# Patient Record
Sex: Female | Born: 1950 | Race: Black or African American | Hispanic: No | Smoking: Never smoker
Health system: Southern US, Community
[De-identification: ages and names within clinical notes are randomized; demographics above are authoritative.]

## PROBLEM LIST (undated history)

## (undated) DIAGNOSIS — K219 Gastro-esophageal reflux disease without esophagitis: Secondary | ICD-10-CM

## (undated) DIAGNOSIS — Z9889 Other specified postprocedural states: Secondary | ICD-10-CM

## (undated) DIAGNOSIS — M1990 Unspecified osteoarthritis, unspecified site: Secondary | ICD-10-CM

## (undated) DIAGNOSIS — I10 Essential (primary) hypertension: Secondary | ICD-10-CM

## (undated) DIAGNOSIS — L509 Urticaria, unspecified: Secondary | ICD-10-CM

## (undated) HISTORY — DX: Other specified postprocedural states: Z98.890

## (undated) HISTORY — DX: Urticaria, unspecified: L50.9

## (undated) HISTORY — PX: BREAST SURGERY: SHX581

## (undated) HISTORY — PX: ABDOMINAL HYSTERECTOMY: SHX81

---

## 1998-06-23 ENCOUNTER — Ambulatory Visit (HOSPITAL_COMMUNITY): Admission: RE | Admit: 1998-06-23 | Discharge: 1998-06-23 | Payer: Self-pay | Admitting: *Deleted

## 1998-07-08 ENCOUNTER — Ambulatory Visit (HOSPITAL_COMMUNITY): Admission: RE | Admit: 1998-07-08 | Discharge: 1998-07-08 | Payer: Self-pay | Admitting: *Deleted

## 1998-07-21 ENCOUNTER — Ambulatory Visit (HOSPITAL_COMMUNITY): Admission: RE | Admit: 1998-07-21 | Discharge: 1998-07-21 | Payer: Self-pay | Admitting: Gastroenterology

## 2000-02-09 ENCOUNTER — Emergency Department (HOSPITAL_COMMUNITY): Admission: EM | Admit: 2000-02-09 | Discharge: 2000-02-09 | Payer: Self-pay | Admitting: Emergency Medicine

## 2000-02-09 ENCOUNTER — Encounter: Payer: Self-pay | Admitting: Emergency Medicine

## 2000-04-05 ENCOUNTER — Encounter: Payer: Self-pay | Admitting: Obstetrics and Gynecology

## 2000-04-05 ENCOUNTER — Encounter: Admission: RE | Admit: 2000-04-05 | Discharge: 2000-04-05 | Payer: Self-pay | Admitting: Obstetrics and Gynecology

## 2000-04-11 ENCOUNTER — Encounter: Payer: Self-pay | Admitting: Obstetrics and Gynecology

## 2000-04-11 ENCOUNTER — Encounter: Admission: RE | Admit: 2000-04-11 | Discharge: 2000-04-11 | Payer: Self-pay | Admitting: Obstetrics and Gynecology

## 2001-04-12 ENCOUNTER — Encounter: Payer: Self-pay | Admitting: Obstetrics and Gynecology

## 2001-04-12 ENCOUNTER — Encounter: Admission: RE | Admit: 2001-04-12 | Discharge: 2001-04-12 | Payer: Self-pay | Admitting: Obstetrics and Gynecology

## 2002-10-02 ENCOUNTER — Ambulatory Visit (HOSPITAL_COMMUNITY): Admission: RE | Admit: 2002-10-02 | Discharge: 2002-10-02 | Payer: Self-pay | Admitting: Gastroenterology

## 2002-10-02 ENCOUNTER — Encounter: Payer: Self-pay | Admitting: Gastroenterology

## 2003-09-01 ENCOUNTER — Encounter (INDEPENDENT_AMBULATORY_CARE_PROVIDER_SITE_OTHER): Payer: Self-pay | Admitting: *Deleted

## 2003-09-01 ENCOUNTER — Ambulatory Visit (HOSPITAL_COMMUNITY): Admission: RE | Admit: 2003-09-01 | Discharge: 2003-09-01 | Payer: Self-pay | Admitting: Gastroenterology

## 2009-07-11 ENCOUNTER — Emergency Department (HOSPITAL_COMMUNITY): Admission: EM | Admit: 2009-07-11 | Discharge: 2009-07-11 | Payer: Self-pay | Admitting: Family Medicine

## 2011-03-06 LAB — URINALYSIS, ROUTINE W REFLEX MICROSCOPIC
Bilirubin Urine: NEGATIVE
Glucose, UA: NEGATIVE mg/dL
Hgb urine dipstick: NEGATIVE
Ketones, ur: NEGATIVE mg/dL
Nitrite: NEGATIVE
Protein, ur: NEGATIVE mg/dL
Specific Gravity, Urine: 1.019 (ref 1.005–1.030)
Urobilinogen, UA: 0.2 mg/dL (ref 0.0–1.0)
pH: 7 (ref 5.0–8.0)

## 2011-03-06 LAB — CBC
HCT: 40 % (ref 36.0–46.0)
Hemoglobin: 12.8 g/dL (ref 12.0–15.0)
MCHC: 31.9 g/dL (ref 30.0–36.0)
MCV: 92.4 fL (ref 78.0–100.0)
Platelets: 322 10*3/uL (ref 150–400)
RBC: 4.33 MIL/uL (ref 3.87–5.11)
RDW: 13.9 % (ref 11.5–15.5)
WBC: 7.8 10*3/uL (ref 4.0–10.5)

## 2011-03-06 LAB — POCT CARDIAC MARKERS
CKMB, poc: 1.9 ng/mL (ref 1.0–8.0)
CKMB, poc: 2.5 ng/mL (ref 1.0–8.0)
Myoglobin, poc: 441 ng/mL (ref 12–200)
Myoglobin, poc: >500 ng/mL (ref 12–200)
Troponin i, poc: <0.05 ng/mL (ref 0.00–0.09)
Troponin i, poc: <0.05 ng/mL (ref 0.00–0.09)

## 2011-03-06 LAB — CK: Total CK: 232 U/L — ABNORMAL HIGH (ref 7–177)

## 2011-03-06 LAB — GLUCOSE, CAPILLARY: Glucose-Capillary: 100 mg/dL — ABNORMAL HIGH (ref 70–99)

## 2011-03-06 LAB — DIFFERENTIAL
Basophils Absolute: 0 10*3/uL (ref 0.0–0.1)
Basophils Relative: 0 % (ref 0–1)
Eosinophils Absolute: 0 10*3/uL (ref 0.0–0.7)
Eosinophils Relative: 0 % (ref 0–5)
Lymphocytes Relative: 14 % (ref 12–46)
Lymphs Abs: 1.1 10*3/uL (ref 0.7–4.0)
Monocytes Absolute: 0.6 10*3/uL (ref 0.1–1.0)
Monocytes Relative: 8 % (ref 3–12)
Neutro Abs: 6.1 10*3/uL (ref 1.7–7.7)
Neutrophils Relative %: 77 % (ref 43–77)

## 2011-03-06 LAB — COMPREHENSIVE METABOLIC PANEL
ALT: 17 U/L (ref 0–35)
AST: 29 U/L (ref 0–37)
Albumin: 3.9 g/dL (ref 3.5–5.2)
Alkaline Phosphatase: 58 U/L (ref 39–117)
BUN: 13 mg/dL (ref 6–23)
CO2: 30 mEq/L (ref 19–32)
Calcium: 9.1 mg/dL (ref 8.4–10.5)
Chloride: 100 mEq/L (ref 96–112)
Creatinine, Ser: 0.95 mg/dL (ref 0.4–1.2)
GFR calc Af Amer: >60 mL/min (ref >60)
GFR calc non Af Amer: >60 mL/min (ref >60)
Glucose, Bld: 93 mg/dL (ref 70–99)
Potassium: 3.9 mEq/L (ref 3.5–5.1)
Sodium: 137 mEq/L (ref 135–145)
Total Bilirubin: 0.4 mg/dL (ref 0.3–1.2)
Total Protein: 7.3 g/dL (ref 6.0–8.3)

## 2011-03-06 LAB — TROPONIN I: Troponin I: 0.02 ng/mL (ref 0.00–0.06)

## 2011-04-15 NOTE — Op Note (Signed)
NAME:  [name], [name]                           ACCOUNT NO.:  [id number]   MEDICAL RECORD NO.:  [id number]                   PATIENT TYPE:  AMB   LOCATION:  ENDO                                 FACILITY:  [facility]   PHYSICIAN:  Anselmo Rod, M.D.               DATE OF BIRTH:  04-12-1951   DATE OF PROCEDURE:  09/01/2003  DATE OF DISCHARGE:                                 OPERATIVE REPORT   PROCEDURE PERFORMED:  Colonoscopy with snare polypectomy x1.   ENDOSCOPIST:  Anselmo Rod, M.D.   INSTRUMENT USED:  Olympus video colonoscope.   INDICATION FOR PROCEDURE:  Guaiac-positive stools in a 60 year old African-  American female.  Rule out colonic polyps, masses, etc.   PREPROCEDURE PREPARATION:  Informed consent was procured from the patient.  The patient was fasted for eight hours prior to the procedure and prepped  with a bottle of magnesium citrate and a gallon of GoLYTELY the night prior  to the procedure.   PREPROCEDURE PHYSICAL:  VITAL SIGNS:  The patient had stable vital signs.  NECK:  Supple.  CHEST:  Clear to auscultation.  S1, S2 regular.  ABDOMEN:  Soft with normal bowel sounds.   DESCRIPTION OF PROCEDURE:  The patient was placed in the left lateral  decubitus position and sedated with an additional 20 mg of Demerol and 2 mg  of Versed intravenously.  The patient received 50 mg of Demerol and 5 mg of  Versed for the EGD done prior to the colonoscopy.  Once the patient was  adequately sedate and maintained on low-flow oxygen and continuous cardiac  monitoring, the Olympus video colonoscope was advanced from the rectum to  the cecum.  There was some residual stool in the colon.  Multiple washes  were done.  Small internal hemorrhoids were seen on retroflexion.  A polyp  was snared at 35 cm.  The rest of the exam was unrevealing.  The appendiceal  orifice and ileocecal valve were clearly visualized and photographed.  The  terminal ileum appeared normal.   IMPRESSION:  1. Small, nonbleeding internal hemorrhoid.  2. Small sessile polyp snared at 35 cm.  3. Some residual stool in the colon, small lesions could have been missed.  4. Normal-appearing cecum and terminal ileum.   RECOMMENDATIONS:  1. Await pathology results.  2.     Avoid nonsteroidals, including aspirin, for now.  3. Repeat guaiacs on an outpatient basis.  4. Outpatient follow-up in the next two weeks or earlier if need be.                                               Anselmo Rod, M.D.    JNM/MEDQ  D:  09/01/2003  T:  09/02/2003  Job:  [id number]  cc:   Merlene Laughter. Renae Gloss, M.D.  [address]  [address]  [place]  [place] [address]  Fax: [phone]

## 2011-04-15 NOTE — Op Note (Signed)
   NAME:  [name], SCARLETT                           ACCOUNT NO.:  [id number]   MEDICAL RECORD NO.:  [id number]                   PATIENT TYPE:  AMB   LOCATION:  ENDO                                 FACILITY:  [facility]   PHYSICIAN:  Anselmo Rod, M.D.               DATE OF BIRTH:  [birth year]   DATE OF PROCEDURE:  09/01/2003  DATE OF DISCHARGE:                                 OPERATIVE REPORT   PROCEDURE PERFORMED:  Esophagogastroduodenoscopy.   ENDOSCOPIST:  Charna Elizabeth, M.D.   INSTRUMENT USED:  Olympus video panendoscope.   INDICATIONS FOR PROCEDURE:  Guaiac positive stool and epigastric pain in a  61 year old African-American female.  Rule out peptic ulcer disease,  esophagitis, gastritis, etc.   PREPROCEDURE PREPARATION:  Informed consent was procured from the patient.  The patient was fasted for eight hours prior to the procedure.   PREPROCEDURE PHYSICAL:  The patient had stable vital signs.  Neck supple,  chest clear to auscultation.  S1, S2 regular.  Abdomen soft with normal  bowel sounds.   DESCRIPTION OF PROCEDURE:  The patient was placed in the left lateral  decubitus position and sedated with 50 mg of Demerol and 5 mg of Versed  intravenously.  Once the patient was adequately sedated and maintained on  low-flow oxygen and continuous cardiac monitoring, the Olympus video  panendoscope was advanced through the mouth piece over the tongue into the  esophagus under direct vision.  The entire esophagus appeared normal with no  evidence of ring, stricture, masses, esophagitis or Barrett's mucosa.  The  scope was then advanced to the stomach.  The entire gastric mucosa and  proximal small bowel appeared normal.   IMPRESSION:  Normal esophagogastroduodenoscopy.   RECOMMENDATIONS:  Proceed with a colonoscopy at this time.  Further  recommendations will be made thereafter.                                                Anselmo Rod, M.D.    JNM/MEDQ  D:  09/01/2003  T:   09/02/2003  Job:  [id number]   cc:   Merlene Laughter. Renae Gloss, M.D.  [address]  [address]  [place]  [place] [address]  Fax: [phone]

## 2011-05-16 ENCOUNTER — Other Ambulatory Visit: Payer: Self-pay | Admitting: Orthopaedic Surgery

## 2011-05-16 DIAGNOSIS — M79606 Pain in leg, unspecified: Secondary | ICD-10-CM

## 2011-05-16 DIAGNOSIS — M545 Low back pain, unspecified: Secondary | ICD-10-CM

## 2011-05-16 DIAGNOSIS — R531 Weakness: Secondary | ICD-10-CM

## 2011-05-24 ENCOUNTER — Ambulatory Visit
Admission: RE | Admit: 2011-05-24 | Discharge: 2011-05-24 | Disposition: A | Discharge status: Home / Self Care | Payer: BC Managed Care – PPO | Source: Ambulatory Visit | Attending: Orthopaedic Surgery | Admitting: Orthopaedic Surgery

## 2011-05-24 DIAGNOSIS — M545 Low back pain, unspecified: Secondary | ICD-10-CM

## 2011-05-24 DIAGNOSIS — R531 Weakness: Secondary | ICD-10-CM

## 2011-05-24 DIAGNOSIS — M79606 Pain in leg, unspecified: Secondary | ICD-10-CM

## 2012-01-25 ENCOUNTER — Other Ambulatory Visit: Payer: Self-pay | Admitting: Orthopedic Surgery

## 2012-01-27 ENCOUNTER — Encounter (HOSPITAL_COMMUNITY): Payer: Self-pay | Admitting: Pharmacy Technician

## 2012-01-31 ENCOUNTER — Encounter (HOSPITAL_COMMUNITY)
Admission: RE | Admit: 2012-01-31 | Discharge: 2012-01-31 | Disposition: A | Discharge status: Home / Self Care | Payer: BC Managed Care – PPO | Source: Ambulatory Visit | Attending: Orthopedic Surgery | Admitting: Orthopedic Surgery

## 2012-01-31 ENCOUNTER — Encounter (HOSPITAL_COMMUNITY): Payer: Self-pay

## 2012-01-31 ENCOUNTER — Other Ambulatory Visit: Payer: Self-pay

## 2012-01-31 HISTORY — DX: Essential (primary) hypertension: I10

## 2012-01-31 HISTORY — DX: Gastro-esophageal reflux disease without esophagitis: K21.9

## 2012-01-31 HISTORY — DX: Unspecified osteoarthritis, unspecified site: M19.90

## 2012-01-31 LAB — SURGICAL PCR SCREEN
MRSA, PCR: NEGATIVE
Staphylococcus aureus: NEGATIVE

## 2012-01-31 LAB — URINALYSIS, ROUTINE W REFLEX MICROSCOPIC
Bilirubin Urine: NEGATIVE
Glucose, UA: NEGATIVE mg/dL
Hgb urine dipstick: NEGATIVE
Ketones, ur: NEGATIVE mg/dL
Leukocytes, UA: NEGATIVE
Nitrite: NEGATIVE
Protein, ur: NEGATIVE mg/dL
Specific Gravity, Urine: 1.008 (ref 1.005–1.030)
Urobilinogen, UA: 0.2 mg/dL (ref 0.0–1.0)
pH: 6.5 (ref 5.0–8.0)

## 2012-01-31 LAB — COMPREHENSIVE METABOLIC PANEL
ALT: 16 U/L (ref 0–35)
AST: 26 U/L (ref 0–37)
Albumin: 4 g/dL (ref 3.5–5.2)
Alkaline Phosphatase: 66 U/L (ref 39–117)
BUN: 22 mg/dL (ref 6–23)
CO2: 29 mEq/L (ref 19–32)
Calcium: 10 mg/dL (ref 8.4–10.5)
Chloride: 100 mEq/L (ref 96–112)
Creatinine, Ser: 1.03 mg/dL (ref 0.50–1.10)
GFR calc Af Amer: 67 mL/min — ABNORMAL LOW (ref >90)
GFR calc non Af Amer: 57 mL/min — ABNORMAL LOW (ref >90)
Glucose, Bld: 80 mg/dL (ref 70–99)
Potassium: 3.6 mEq/L (ref 3.5–5.1)
Sodium: 138 mEq/L (ref 135–145)
Total Bilirubin: 0.3 mg/dL (ref 0.3–1.2)
Total Protein: 7.6 g/dL (ref 6.0–8.3)

## 2012-01-31 LAB — APTT: aPTT: 29 seconds (ref 24–37)

## 2012-01-31 LAB — DIFFERENTIAL
Basophils Absolute: 0 10*3/uL (ref 0.0–0.1)
Basophils Relative: 0 % (ref 0–1)
Eosinophils Absolute: 0.2 10*3/uL (ref 0.0–0.7)
Eosinophils Relative: 3 % (ref 0–5)
Lymphocytes Relative: 46 % (ref 12–46)
Lymphs Abs: 2.8 10*3/uL (ref 0.7–4.0)
Monocytes Absolute: 0.4 10*3/uL (ref 0.1–1.0)
Monocytes Relative: 7 % (ref 3–12)
Neutro Abs: 2.7 10*3/uL (ref 1.7–7.7)
Neutrophils Relative %: 44 % (ref 43–77)

## 2012-01-31 LAB — CBC
HCT: 39.6 % (ref 36.0–46.0)
Hemoglobin: 12.9 g/dL (ref 12.0–15.0)
MCH: 29.5 pg (ref 26.0–34.0)
MCHC: 32.6 g/dL (ref 30.0–36.0)
MCV: 90.4 fL (ref 78.0–100.0)
Platelets: 368 10*3/uL (ref 150–400)
RBC: 4.38 MIL/uL (ref 3.87–5.11)
RDW: 13.3 % (ref 11.5–15.5)
WBC: 6 10*3/uL (ref 4.0–10.5)

## 2012-01-31 LAB — ABO/RH: ABO/RH(D): O POS

## 2012-01-31 LAB — PROTIME-INR
INR: 0.95 (ref 0.00–1.49)
Prothrombin Time: 12.9 seconds (ref 11.6–15.2)

## 2012-01-31 MED ORDER — POVIDONE-IODINE 7.5 % EX SOLN
Freq: Once | CUTANEOUS | Status: DC
  Filled 2012-01-31: qty 118

## 2012-01-31 NOTE — Pre-Procedure Instructions (Signed)
20 [name]  01/31/2012   Your procedure is scheduled on:  02/08/12  Report to [facility] at 630 AM.  Call this number if you have problems the morning of surgery: [phone]   Remember:   Do not eat food:After Midnight.  May have clear liquids: up to 4 Hours before arrival.  Clear liquids include soda, tea, black coffee, apple or grape juice, broth.  Take these medicines the morning of surgery with A SIP OF WATER: vicodan,estradiol   Do not wear jewelry, make-up or nail polish.  Do not wear lotions, powders, or perfumes. You may wear deodorant.  Do not shave 48 hours prior to surgery.  Do not bring valuables to the hospital.  Contacts, dentures or bridgework may not be worn into surgery.  Leave suitcase in the car. After surgery it may be brought to your room.  For patients admitted to the hospital, checkout time is 11:00 AM the day of discharge.   Patients discharged the day of surgery will not be allowed to drive home.  Name and phone number of your driver: family  Special Instructions: CHG Shower Use Special Wash: 1/2 bottle night before surgery and 1/2 bottle morning of surgery.   Please read over the following fact sheets that you were given: Pain Booklet, Blood Transfusion Information, MRSA Information and Surgical Site Infection Prevention

## 2012-02-07 MED ORDER — VANCOMYCIN HCL IN DEXTROSE 1-5 GM/200ML-% IV SOLN
1000.0000 mg | INTRAVENOUS | Status: AC
  Administered 2012-02-08: 1000 mg via INTRAVENOUS
  Filled 2012-02-07: qty 200

## 2012-02-08 ENCOUNTER — Encounter (HOSPITAL_COMMUNITY): Payer: Self-pay | Admitting: *Deleted

## 2012-02-08 ENCOUNTER — Encounter (HOSPITAL_COMMUNITY): Payer: Self-pay | Admitting: Critical Care Medicine

## 2012-02-08 ENCOUNTER — Inpatient Hospital Stay (HOSPITAL_COMMUNITY)
Admission: RE | Admit: 2012-02-08 | Discharge: 2012-02-11 | DRG: 756 | Disposition: A | Discharge status: Home / Self Care | Payer: BC Managed Care – PPO | Source: Ambulatory Visit | Attending: Orthopedic Surgery | Admitting: Orthopedic Surgery

## 2012-02-08 ENCOUNTER — Encounter (HOSPITAL_COMMUNITY)
Admission: RE | Disposition: A | Discharge status: Home / Self Care | Payer: Self-pay | Source: Ambulatory Visit | Attending: Orthopedic Surgery

## 2012-02-08 ENCOUNTER — Ambulatory Visit (HOSPITAL_COMMUNITY): Payer: BC Managed Care – PPO | Admitting: Critical Care Medicine

## 2012-02-08 ENCOUNTER — Ambulatory Visit (HOSPITAL_COMMUNITY): Payer: BC Managed Care – PPO

## 2012-02-08 DIAGNOSIS — R209 Unspecified disturbances of skin sensation: Secondary | ICD-10-CM | POA: Diagnosis not present

## 2012-02-08 DIAGNOSIS — I10 Essential (primary) hypertension: Secondary | ICD-10-CM | POA: Diagnosis present

## 2012-02-08 DIAGNOSIS — K219 Gastro-esophageal reflux disease without esophagitis: Secondary | ICD-10-CM | POA: Diagnosis present

## 2012-02-08 DIAGNOSIS — IMO0002 Reserved for concepts with insufficient information to code with codable children: Secondary | ICD-10-CM | POA: Diagnosis present

## 2012-02-08 DIAGNOSIS — Q762 Congenital spondylolisthesis: Secondary | ICD-10-CM

## 2012-02-08 DIAGNOSIS — M129 Arthropathy, unspecified: Secondary | ICD-10-CM | POA: Diagnosis present

## 2012-02-08 DIAGNOSIS — M5416 Radiculopathy, lumbar region: Secondary | ICD-10-CM

## 2012-02-08 DIAGNOSIS — M48061 Spinal stenosis, lumbar region without neurogenic claudication: Principal | ICD-10-CM | POA: Diagnosis present

## 2012-02-08 SURGERY — POSTERIOR LUMBAR FUSION 1 LEVEL
Anesthesia: General | Site: Spine Lumbar | Laterality: Right | Wound class: Clean

## 2012-02-08 MED ORDER — METOCLOPRAMIDE HCL 5 MG/ML IJ SOLN
INTRAMUSCULAR | Status: DC | PRN
  Administered 2012-02-08: 10 mg via INTRAVENOUS

## 2012-02-08 MED ORDER — ACETAMINOPHEN 325 MG PO TABS: 650.0000 mg | ORAL_TABLET | ORAL | Status: DC | PRN

## 2012-02-08 MED ORDER — THROMBIN 20000 UNITS EX SOLR
CUTANEOUS | Status: DC | PRN
  Administered 2012-02-08 (×2): 20000 [IU] via TOPICAL

## 2012-02-08 MED ORDER — DROPERIDOL 2.5 MG/ML IJ SOLN
INTRAMUSCULAR | Status: DC | PRN
  Administered 2012-02-08: 0.625 mg via INTRAVENOUS

## 2012-02-08 MED ORDER — SODIUM CHLORIDE 0.9 % IJ SOLN
3.0000 mL | Freq: Two times a day (BID) | INTRAMUSCULAR | Status: DC
  Administered 2012-02-08 – 2012-02-11 (×5): 3 mL via INTRAVENOUS

## 2012-02-08 MED ORDER — ACETAMINOPHEN 650 MG RE SUPP: 650.0000 mg | RECTAL | Status: DC | PRN

## 2012-02-08 MED ORDER — MENTHOL 3 MG MT LOZG: 1.0000 | LOZENGE | OROMUCOSAL | Status: DC | PRN

## 2012-02-08 MED ORDER — VECURONIUM BROMIDE 10 MG IV SOLR
INTRAVENOUS | Status: DC | PRN
  Administered 2012-02-08: 1 mg via INTRAVENOUS
  Administered 2012-02-08 (×2): 2 mg via INTRAVENOUS

## 2012-02-08 MED ORDER — PANTOPRAZOLE SODIUM 40 MG PO TBEC
40.0000 mg | DELAYED_RELEASE_TABLET | Freq: Every day | ORAL | Status: DC
  Administered 2012-02-09 – 2012-02-10 (×2): 40 mg via ORAL
  Filled 2012-02-08 (×3): qty 1

## 2012-02-08 MED ORDER — FENTANYL CITRATE 0.05 MG/ML IJ SOLN
INTRAMUSCULAR | Status: DC | PRN
  Administered 2012-02-08: 50 ug via INTRAVENOUS
  Administered 2012-02-08 (×2): 100 ug via INTRAVENOUS
  Administered 2012-02-08: 50 ug via INTRAVENOUS

## 2012-02-08 MED ORDER — HYDROMORPHONE HCL PF 1 MG/ML IJ SOLN
0.2500 mg | INTRAMUSCULAR | Status: DC | PRN
  Administered 2012-02-08 (×2): 0.5 mg via INTRAVENOUS

## 2012-02-08 MED ORDER — EPHEDRINE SULFATE 50 MG/ML IJ SOLN
INTRAMUSCULAR | Status: DC | PRN
  Administered 2012-02-08 (×2): 5 mg via INTRAVENOUS

## 2012-02-08 MED ORDER — ONDANSETRON HCL 4 MG/2ML IJ SOLN
INTRAMUSCULAR | Status: DC | PRN
  Administered 2012-02-08: 4 mg via INTRAVENOUS

## 2012-02-08 MED ORDER — ZOLPIDEM TARTRATE 5 MG PO TABS: 5.0000 mg | ORAL_TABLET | Freq: Every evening | ORAL | Status: DC | PRN

## 2012-02-08 MED ORDER — PROPOFOL 10 MG/ML IV EMUL
INTRAVENOUS | Status: DC | PRN
  Administered 2012-02-08: 50 ug/kg/min via INTRAVENOUS

## 2012-02-08 MED ORDER — SODIUM CHLORIDE 0.9 % IJ SOLN: 3.0000 mL | INTRAMUSCULAR | Status: DC | PRN

## 2012-02-08 MED ORDER — 0.9 % SODIUM CHLORIDE (POUR BTL) OPTIME
TOPICAL | Status: DC | PRN
  Administered 2012-02-08 (×3): 1000 mL

## 2012-02-08 MED ORDER — ROCURONIUM BROMIDE 100 MG/10ML IV SOLN
INTRAVENOUS | Status: DC | PRN
  Administered 2012-02-08: 50 mg via INTRAVENOUS

## 2012-02-08 MED ORDER — VANCOMYCIN HCL IN DEXTROSE 1-5 GM/200ML-% IV SOLN
1000.0000 mg | Freq: Two times a day (BID) | INTRAVENOUS | Status: DC
  Administered 2012-02-08 – 2012-02-09 (×3): 1000 mg via INTRAVENOUS
  Filled 2012-02-08 (×6): qty 200

## 2012-02-08 MED ORDER — HYDROXYZINE HCL 25 MG PO TABS
50.0000 mg | ORAL_TABLET | ORAL | Status: DC | PRN
Start: 2012-02-08 — End: 2012-02-11
  Administered 2012-02-09: 50 mg via ORAL
  Filled 2012-02-08: qty 2

## 2012-02-08 MED ORDER — MORPHINE SULFATE 2 MG/ML IJ SOLN: 0.0500 mg/kg | INTRAMUSCULAR | Status: DC | PRN

## 2012-02-08 MED ORDER — ESTRADIOL 0.05 MG/24HR TD PTWK: 0.0500 mg | MEDICATED_PATCH | TRANSDERMAL | Status: DC

## 2012-02-08 MED ORDER — BUPIVACAINE-EPINEPHRINE 0.25% -1:200000 IJ SOLN
INTRAMUSCULAR | Status: DC | PRN
  Administered 2012-02-08: 10 mL

## 2012-02-08 MED ORDER — VITAMIN D3 25 MCG (1000 UNIT) PO TABS
1000.0000 [IU] | ORAL_TABLET | Freq: Every day | ORAL | Status: DC
  Administered 2012-02-09 – 2012-02-11 (×3): 1000 [IU] via ORAL
  Filled 2012-02-08 (×4): qty 1

## 2012-02-08 MED ORDER — HYDROXYZINE HCL 50 MG/ML IM SOLN
50.0000 mg | INTRAMUSCULAR | Status: DC | PRN
  Filled 2012-02-08: qty 1

## 2012-02-08 MED ORDER — SODIUM CHLORIDE 0.9 % IV SOLN: 250.0000 mL | INTRAVENOUS | Status: DC

## 2012-02-08 MED ORDER — PROPOFOL 10 MG/ML IV EMUL
INTRAVENOUS | Status: DC | PRN
  Administered 2012-02-08: 200 mg via INTRAVENOUS

## 2012-02-08 MED ORDER — OMEGA-3-ACID ETHYL ESTERS 1 G PO CAPS
1.0000 g | ORAL_CAPSULE | Freq: Every day | ORAL | Status: DC
  Administered 2012-02-09 – 2012-02-11 (×3): 1 g via ORAL
  Filled 2012-02-08 (×4): qty 1

## 2012-02-08 MED ORDER — CALCIUM CARBONATE ANTACID 500 MG PO CHEW
1.0000 | CHEWABLE_TABLET | Freq: Every day | ORAL | Status: DC
  Administered 2012-02-09 – 2012-02-11 (×3): 200 mg via ORAL
  Filled 2012-02-08 (×4): qty 1

## 2012-02-08 MED ORDER — DIAZEPAM 5 MG PO TABS
5.0000 mg | ORAL_TABLET | Freq: Four times a day (QID) | ORAL | Status: DC | PRN
  Administered 2012-02-09: 5 mg via ORAL
  Filled 2012-02-08: qty 1

## 2012-02-08 MED ORDER — MORPHINE SULFATE 2 MG/ML IJ SOLN
INTRAMUSCULAR | Status: AC
  Administered 2012-02-08: 2 mg via INTRAVENOUS
  Filled 2012-02-08: qty 1

## 2012-02-08 MED ORDER — OXYCODONE-ACETAMINOPHEN 5-325 MG PO TABS
1.0000 | ORAL_TABLET | ORAL | Status: DC | PRN
  Administered 2012-02-08 – 2012-02-11 (×10): 2 via ORAL
  Filled 2012-02-08 (×10): qty 2

## 2012-02-08 MED ORDER — OXYCODONE HCL 20 MG PO TB12
20.0000 mg | ORAL_TABLET | Freq: Two times a day (BID) | ORAL | Status: DC
  Administered 2012-02-08 – 2012-02-11 (×6): 20 mg via ORAL
  Filled 2012-02-08 (×6): qty 1

## 2012-02-08 MED ORDER — PHENOL 1.4 % MT LIQD: 1.0000 | OROMUCOSAL | Status: DC | PRN

## 2012-02-08 MED ORDER — SIMVASTATIN 40 MG PO TABS
40.0000 mg | ORAL_TABLET | Freq: Every day | ORAL | Status: DC
  Administered 2012-02-08 – 2012-02-10 (×3): 40 mg via ORAL
  Filled 2012-02-08 (×5): qty 1

## 2012-02-08 MED ORDER — LACTATED RINGERS IV SOLN
INTRAVENOUS | Status: DC | PRN
  Administered 2012-02-08 (×3): via INTRAVENOUS

## 2012-02-08 MED ORDER — HETASTARCH-ELECTROLYTES 6 % IV SOLN
INTRAVENOUS | Status: DC | PRN
  Administered 2012-02-08: 10:00:00 via INTRAVENOUS

## 2012-02-08 MED ORDER — THROMBIN 20000 UNITS EX KIT
PACK | CUTANEOUS | Status: DC | PRN
  Administered 2012-02-08: 10:00:00 via TOPICAL

## 2012-02-08 MED ORDER — LISINOPRIL 20 MG PO TABS
20.0000 mg | ORAL_TABLET | Freq: Every day | ORAL | Status: DC
  Administered 2012-02-10 – 2012-02-11 (×2): 20 mg via ORAL
  Filled 2012-02-08 (×4): qty 1

## 2012-02-08 MED ORDER — MORPHINE SULFATE 2 MG/ML IJ SOLN
2.0000 mg | INTRAMUSCULAR | Status: DC | PRN
Start: 2012-02-08 — End: 2012-02-11
  Administered 2012-02-08 – 2012-02-09 (×3): 2 mg via INTRAVENOUS
  Filled 2012-02-08 (×2): qty 1

## 2012-02-08 MED ORDER — MIDAZOLAM HCL 5 MG/5ML IJ SOLN
INTRAMUSCULAR | Status: DC | PRN
  Administered 2012-02-08: 2 mg via INTRAVENOUS

## 2012-02-08 MED ORDER — METOCLOPRAMIDE HCL 5 MG/ML IJ SOLN
10.0000 mg | Freq: Once | INTRAMUSCULAR | Status: DC | PRN
  Filled 2012-02-08: qty 2

## 2012-02-08 MED ORDER — POTASSIUM CHLORIDE IN NACL 20-0.9 MEQ/L-% IV SOLN
INTRAVENOUS | Status: DC
  Administered 2012-02-08 – 2012-02-09 (×2): via INTRAVENOUS
  Filled 2012-02-08 (×8): qty 1000

## 2012-02-08 MED ORDER — PHENYLEPHRINE HCL 10 MG/ML IJ SOLN
10.0000 mg | INTRAVENOUS | Status: DC | PRN
  Administered 2012-02-08: 25 ug/min via INTRAVENOUS

## 2012-02-08 MED ORDER — SODIUM CHLORIDE 0.9 % IV SOLN
INTRAVENOUS | Status: DC | PRN
  Administered 2012-02-08: 11:00:00 via INTRAVENOUS

## 2012-02-08 MED ORDER — PHENYLEPHRINE HCL 10 MG/ML IJ SOLN
INTRAMUSCULAR | Status: DC | PRN
  Administered 2012-02-08: 40 ug via INTRAVENOUS

## 2012-02-08 SURGICAL SUPPLY — 78 items
APL SKNCLS STERI-STRIP NONHPOA (GAUZE/BANDAGES/DRESSINGS) ×1
BENZOIN TINCTURE PRP APPL 2/3 (GAUZE/BANDAGES/DRESSINGS) ×1 IMPLANT
BLADE SURG ROTATE 9660 (MISCELLANEOUS) IMPLANT
BUR ROUND PRECISION 4.0 (BURR) ×2 IMPLANT
CAGE BULLET CONCORDE 9X9X27 (Cage) ×1 IMPLANT
CARTRIDGE OIL MAESTRO DRILL (MISCELLANEOUS) IMPLANT
CLOTH BEACON ORANGE TIMEOUT ST (SAFETY) ×2 IMPLANT
CLSR STERI-STRIP ANTIMIC 1/2X4 (GAUZE/BANDAGES/DRESSINGS) ×1 IMPLANT
CONT SPEC STER OR (MISCELLANEOUS) ×2 IMPLANT
CORDS BIPOLAR (ELECTRODE) ×2 IMPLANT
COVER SURGICAL LIGHT HANDLE (MISCELLANEOUS) ×2 IMPLANT
DIFFUSER DRILL AIR PNEUMATIC (MISCELLANEOUS) ×1 IMPLANT
DRAIN CHANNEL 15F RND FF W/TCR (WOUND CARE) ×1 IMPLANT
DRAPE C-ARM 42X72 X-RAY (DRAPES) ×2 IMPLANT
DRAPE ORTHO SPLIT 77X108 STRL (DRAPES) ×2
DRAPE POUCH INSTRU U-SHP 10X18 (DRAPES) ×2 IMPLANT
DRAPE SURG 17X23 STRL (DRAPES) ×6 IMPLANT
DRAPE SURG ORHT 6 SPLT 77X108 (DRAPES) ×1 IMPLANT
DURAPREP 26ML APPLICATOR (WOUND CARE) ×2 IMPLANT
ELECT BLADE 4.0 EZ CLEAN MEGAD (MISCELLANEOUS) ×2
ELECT CAUTERY BLADE 6.4 (BLADE) ×2 IMPLANT
ELECT REM PT RETURN 9FT ADLT (ELECTROSURGICAL) ×2
ELECTRODE BLDE 4.0 EZ CLN MEGD (MISCELLANEOUS) ×1 IMPLANT
ELECTRODE REM PT RTRN 9FT ADLT (ELECTROSURGICAL) ×1 IMPLANT
EVACUATOR SILICONE 100CC (DRAIN) ×1 IMPLANT
GAUZE SPONGE 4X4 16PLY XRAY LF (GAUZE/BANDAGES/DRESSINGS) ×6 IMPLANT
GLOVE BIO SURGEON STRL SZ8 (GLOVE) ×3 IMPLANT
GLOVE BIOGEL PI IND STRL 8 (GLOVE) ×1 IMPLANT
GLOVE BIOGEL PI INDICATOR 8 (GLOVE) ×3
GLOVE ECLIPSE 7.5 STRL STRAW (GLOVE) ×1 IMPLANT
GLOVE SURG ORTHO 8.0 STRL STRW (GLOVE) ×1 IMPLANT
GLOVE SURG SS PI 7.5 STRL IVOR (GLOVE) ×3 IMPLANT
GOWN PREVENTION PLUS XLARGE (GOWN DISPOSABLE) ×2 IMPLANT
GOWN PREVENTION PLUS XXLARGE (GOWN DISPOSABLE) ×1 IMPLANT
GOWN STRL NON-REIN LRG LVL3 (GOWN DISPOSABLE) ×2 IMPLANT
GOWN STRL REIN XL XLG (GOWN DISPOSABLE) ×2 IMPLANT
IV CATH 14GX2 1/4 (CATHETERS) ×2 IMPLANT
KIT BASIN OR (CUSTOM PROCEDURE TRAY) ×2 IMPLANT
KIT POSITION SURG JACKSON T1 (MISCELLANEOUS) ×2 IMPLANT
KIT ROOM TURNOVER OR (KITS) ×2 IMPLANT
MARKER SKIN DUAL TIP RULER LAB (MISCELLANEOUS) ×2 IMPLANT
NDL HYPO 25GX1X1/2 BEV (NEEDLE) ×1 IMPLANT
NDL SPNL 18GX3.5 QUINCKE PK (NEEDLE) ×2 IMPLANT
NEEDLE BONE MARROW 8GX6 FENEST (NEEDLE) ×1 IMPLANT
NEEDLE HYPO 25GX1X1/2 BEV (NEEDLE) ×2 IMPLANT
NEEDLE SPNL 18GX3.5 QUINCKE PK (NEEDLE) ×4 IMPLANT
NS IRRIG 1000ML POUR BTL (IV SOLUTION) ×2 IMPLANT
OIL CARTRIDGE MAESTRO DRILL (MISCELLANEOUS) ×2
PACK LAMINECTOMY ORTHO (CUSTOM PROCEDURE TRAY) ×2 IMPLANT
PACK UNIVERSAL I (CUSTOM PROCEDURE TRAY) ×2 IMPLANT
PACK VITOSS BIOACTIVE 10CC (Neuro Prosthesis/Implant) ×1 IMPLANT
PAD ARMBOARD 7.5X6 YLW CONV (MISCELLANEOUS) ×4 IMPLANT
PATTIES SURGICAL .5 X1 (DISPOSABLE) ×2 IMPLANT
PATTIES SURGICAL .5X1.5 (GAUZE/BANDAGES/DRESSINGS) ×2 IMPLANT
ROD EXPEDIUM PREBENT 5.5X35MM (Rod) ×1 IMPLANT
SCREW POLYAXIAL 7X45MM (Screw) ×4 IMPLANT
SCREW SET SINGLE INNER (Screw) ×4 IMPLANT
SPONGE GAUZE 4X4 12PLY (GAUZE/BANDAGES/DRESSINGS) ×2 IMPLANT
SPONGE INTESTINAL PEANUT (DISPOSABLE) ×2 IMPLANT
SPONGE SURGIFOAM ABS GEL 100 (HEMOSTASIS) ×2 IMPLANT
STRIP CLOSURE SKIN 1/2X4 (GAUZE/BANDAGES/DRESSINGS) IMPLANT
SURGIFLO TRUKIT (HEMOSTASIS) IMPLANT
SUT MNCRL AB 3-0 PS2 18 (SUTURE) ×2 IMPLANT
SUT MNCRL AB 4-0 PS2 18 (SUTURE) ×1 IMPLANT
SUT VIC AB 0 CT1 18XCR BRD 8 (SUTURE) ×1 IMPLANT
SUT VIC AB 0 CT1 8-18 (SUTURE) ×2
SUT VIC AB 1 CT1 18XCR BRD 8 (SUTURE) ×2 IMPLANT
SUT VIC AB 1 CT1 8-18 (SUTURE) ×2
SUT VIC AB 2-0 CT2 18 VCP726D (SUTURE) ×2 IMPLANT
SYR 20CC LL (SYRINGE) ×2 IMPLANT
SYR BULB IRRIGATION 50ML (SYRINGE) ×2 IMPLANT
SYR CONTROL 10ML LL (SYRINGE) ×4 IMPLANT
TAPE CLOTH SURG 6X10 WHT LF (GAUZE/BANDAGES/DRESSINGS) ×1 IMPLANT
TOWEL OR 17X24 6PK STRL BLUE (TOWEL DISPOSABLE) ×2 IMPLANT
TOWEL OR 17X26 10 PK STRL BLUE (TOWEL DISPOSABLE) ×2 IMPLANT
TRAY FOLEY CATH 14FR (SET/KITS/TRAYS/PACK) ×2 IMPLANT
WATER STERILE IRR 1000ML POUR (IV SOLUTION) ×1 IMPLANT
YANKAUER SUCT BULB TIP NO VENT (SUCTIONS) ×2 IMPLANT

## 2012-02-08 NOTE — Preoperative (Signed)
Beta Blockers   Reason not to administer Beta Blockers:Not Applicable, pt. Not on beta blocker 

## 2012-02-08 NOTE — Anesthesia Preprocedure Evaluation (Signed)
Anesthesia Evaluation  Patient identified by MRN, date of birth, ID band Patient awake    Reviewed: Allergy & Precautions, H&P , NPO status , Patient's Chart, lab work & pertinent test results, reviewed documented beta blocker date and time   Airway Mallampati: II TM Distance: >3 FB Neck ROM: full    Dental   Pulmonary neg pulmonary ROS,          Cardiovascular hypertension, On Medications     Neuro/Psych negative neurological ROS  negative psych ROS   GI/Hepatic Neg liver ROS, GERD-  Medicated and Controlled,  Endo/Other  negative endocrine ROS  Renal/GU negative Renal ROS  negative genitourinary   Musculoskeletal   Abdominal   Peds  Hematology negative hematology ROS (+)   Anesthesia Other Findings See surgeon's H&P   Reproductive/Obstetrics negative OB ROS                           Anesthesia Physical Anesthesia Plan  ASA: II  Anesthesia Plan: General   Post-op Pain Management:    Induction: Intravenous  Airway Management Planned: Oral ETT  Additional Equipment:   Intra-op Plan:   Post-operative Plan: Extubation in OR  Informed Consent: I have reviewed the patients History and Physical, chart, labs and discussed the procedure including the risks, benefits and alternatives for the proposed anesthesia with the patient or authorized representative who has indicated his/her understanding and acceptance.     Plan Discussed with: CRNA and Surgeon  Anesthesia Plan Comments:         Anesthesia Quick Evaluation

## 2012-02-08 NOTE — Progress Notes (Signed)
PREOPERATIVE H&P  Chief Complaint: bilateral leg pain   HPI: [name] is a 61 y.o. female who presents with bilateral leg pain  Past Medical History  Diagnosis Date  . Hypertension   . Arthritis   . GERD (gastroesophageal reflux disease)    Past Surgical History  Procedure Date  . Abdominal hysterectomy   . Breast surgery     bio  neg cancer   History   Social History  . Marital Status: Married    Spouse Name: N/A    Number of Children: N/A  . Years of Education: N/A   Social History Main Topics  . Smoking status: Never Smoker   . Smokeless tobacco: Not on file  . Alcohol Use: No  . Drug Use: No  . Sexually Active:    Other Topics Concern  . Not on file   Social History Narrative  . No narrative on file   No family history on file. Allergies  Allergen Reactions  . Penicillins Hives   Prior to Admission medications   Medication Sig Start Date End Date Taking? Authorizing Provider  Ascorbic Acid (VITAMIN C) 1000 MG tablet Take 1,000 mg by mouth daily.   Yes Historical Provider, MD  aspirin 81 MG tablet Take 81 mg by mouth daily.   Yes Historical Provider, MD  calcium carbonate (TUMS - DOSED IN MG ELEMENTAL CALCIUM) 500 MG chewable tablet Chew 1 tablet by mouth daily.   Yes Historical Provider, MD  cholecalciferol (VITAMIN D) 1000 UNITS tablet Take 1,000 Units by mouth daily.   Yes Historical Provider, MD  dexlansoprazole (DEXILANT) 60 MG capsule Take 60 mg by mouth daily.   Yes Historical Provider, MD  estradiol (VIVELLE-DOT) 0.0375 MG/24HR Place 1 patch onto the skin once a week.   Yes Historical Provider, MD  HYDROcodone-acetaminophen (VICODIN) 5-500 MG per tablet Take 1 tablet by mouth every 6 (six) hours as needed. For pain   Yes Historical Provider, MD  lisinopril (PRINIVIL,ZESTRIL) 20 MG tablet Take 20 mg by mouth daily.   Yes Historical Provider, MD  omega-3 acid ethyl esters (LOVAZA) 1 G capsule Take 1 g by mouth daily.   Yes Historical Provider, MD    simvastatin (ZOCOR) 40 MG tablet Take 40 mg by mouth every evening.   Yes Historical Provider, MD     All other systems have been reviewed and were otherwise negative with the exception of those mentioned in the HPI and as above.  Physical Exam: There were no vitals filed for this visit.  General: Alert, no acute distress Cardiovascular: No pedal edema Respiratory: No cyanosis, no use of accessory musculature GI: No organomegaly, abdomen is soft and non-tender Skin: No lesions in the area of chief complaint Neurologic: Sensation intact distally Psychiatric: Patient is competent for consent with normal mood and affect Lymphatic: No axillary or cervical lymphadenopathy  MUSCULOSKELETAL: + TTP low back  Assessment/Plan: Bilateral leg pain, rigth greater than left Plan for Procedure(s): POSTERIOR LUMBAR FUSION 1 LEVEL   Emilee Hero, MD 02/08/2012 6:35 AM

## 2012-02-08 NOTE — Progress Notes (Addendum)
ANTIBIOTIC CONSULT NOTE - INITIAL  Pharmacy Consult for vancomycin Indication: s/p lumbar fusion  Allergies  Allergen Reactions  . Penicillins Hives    Patient Measurements: Weight: 208 lb 8 oz (94.575 kg)   Vital Signs: Temp: 97.7 F (36.5 C) (03/13 1514) Temp src: Oral (03/13 0716) BP: 135/70 mmHg (03/13 1500) Pulse Rate: 85  (03/13 1324)  Labs: -SCr=1.03 on 01/31/12 -CrCl~65 ml/min  Medical History: Past Medical History  Diagnosis Date  . Hypertension   . Arthritis   . GERD (gastroesophageal reflux disease)   . Constipation     Assessment: 61 yo female s/p lumbar fusion to receive vancomycin post OR (last dose was 1000mg  at ~0830 today). Pt noted with JP drain in place.  Goal -Vancomycin trough= 10-15  Plan:  -Will continue vancomycin at 100mg  IV q12hr (next dose at 8:00pm) -Will follow for length of therapy and levels as needed  Benny Lennert 02/08/2012,3:43 PM

## 2012-02-08 NOTE — Transfer of Care (Signed)
Immediate Anesthesia Transfer of Care Note  Patient: [name]  Procedure(s) Performed: Procedure(s) (LRB): POSTERIOR LUMBAR FUSION 1 LEVEL (Right)  Patient Location: PACU  Anesthesia Type: General  Level of Consciousness: awake, alert  and oriented  Airway & Oxygen Therapy: Patient Spontanous Breathing and Patient connected to nasal cannula oxygen  Post-op Assessment: Report given to PACU RN, Post -op Vital signs reviewed and stable and Patient moving all extremities X 4  Post vital signs: Reviewed and stable  Complications: No apparent anesthesia complications

## 2012-02-08 NOTE — Plan of Care (Signed)
Problem: Consults Goal: Diagnosis - Spinal Surgery Outcome: Completed/Met Date Met:  02/08/12 Thoraco/Lumbar Spine Fusion

## 2012-02-08 NOTE — Progress Notes (Signed)
2nd unit of PRBC completed at 1620.  VSS.  No transfusion reaction.

## 2012-02-08 NOTE — Anesthesia Postprocedure Evaluation (Signed)
Anesthesia Post Note  Patient: [name]  Procedure(s) Performed: Procedure(s) (LRB): POSTERIOR LUMBAR FUSION 1 LEVEL (Right)  Anesthesia type: general  Patient location: PACU  Post pain: Pain level controlled  Post assessment: Patient's Cardiovascular Status Stable  Last Vitals:  Filed Vitals:   02/08/12 1430  BP: 133/74  Pulse:   Temp:   Resp:     Post vital signs: Reviewed and stable  Level of consciousness: sedated  Complications: No apparent anesthesia complications

## 2012-02-08 NOTE — Anesthesia Procedure Notes (Signed)
Procedure Name: Intubation Date/Time: 02/08/2012 8:05 AM Performed by: Elon Alas Pre-anesthesia Checklist: Patient identified, Emergency Drugs available, Timeout performed, Suction available and Patient being monitored Patient Re-evaluated:Patient Re-evaluated prior to inductionOxygen Delivery Method: Circle system utilized Preoxygenation: Pre-oxygenation with 100% oxygen Intubation Type: IV induction and Cricoid Pressure applied Ventilation: Mask ventilation without difficulty and Oral airway inserted - appropriate to patient size Laryngoscope Size: Mac and 3 Grade View: Grade III Tube type: Oral Tube size: 7.5 mm Number of attempts: 1 Airway Equipment and Method: Stylet Placement Confirmation: ETT inserted through vocal cords under direct vision,  positive ETCO2 and breath sounds checked- equal and bilateral Secured at: 21 cm Tube secured with: Tape Dental Injury: Teeth and Oropharynx as per pre-operative assessment

## 2012-02-09 LAB — TYPE AND SCREEN
ABO/RH(D): O POS
Antibody Screen: NEGATIVE
Unit division: 0
Unit division: 0

## 2012-02-09 LAB — CBC
HCT: 31.6 % — ABNORMAL LOW (ref 36.0–46.0)
Hemoglobin: 10.7 g/dL — ABNORMAL LOW (ref 12.0–15.0)
MCH: 30.1 pg (ref 26.0–34.0)
MCHC: 33.9 g/dL (ref 30.0–36.0)
MCV: 88.8 fL (ref 78.0–100.0)
Platelets: 200 10*3/uL (ref 150–400)
RBC: 3.56 MIL/uL — ABNORMAL LOW (ref 3.87–5.11)
RDW: 14.1 % (ref 11.5–15.5)
WBC: 11.7 10*3/uL — ABNORMAL HIGH (ref 4.0–10.5)

## 2012-02-09 LAB — BASIC METABOLIC PANEL
BUN: 15 mg/dL (ref 6–23)
CO2: 27 mEq/L (ref 19–32)
Calcium: 7.7 mg/dL — ABNORMAL LOW (ref 8.4–10.5)
Chloride: 104 mEq/L (ref 96–112)
Creatinine, Ser: 0.74 mg/dL (ref 0.50–1.10)
GFR calc Af Amer: >90 mL/min (ref >90)
GFR calc non Af Amer: 90 mL/min — ABNORMAL LOW (ref >90)
Glucose, Bld: 111 mg/dL — ABNORMAL HIGH (ref 70–99)
Potassium: 3.9 mEq/L (ref 3.5–5.1)
Sodium: 134 mEq/L — ABNORMAL LOW (ref 135–145)

## 2012-02-09 MED ORDER — OXYCODONE HCL 20 MG PO TB12: 20.0000 mg | ORAL_TABLET | Freq: Two times a day (BID) | ORAL | Status: AC

## 2012-02-09 MED ORDER — SODIUM CHLORIDE 0.9 % IV BOLUS (SEPSIS)
1000.0000 mL | Freq: Once | INTRAVENOUS | Status: AC
  Administered 2012-02-09: 1000 mL via INTRAVENOUS

## 2012-02-09 NOTE — Op Note (Signed)
[name], [name] NO.:  [id number]  MEDICAL RECORD NO.:  [id number]  LOCATION:  5034                         FACILITY:  [facility]  PHYSICIAN:  Estill Bamberg, MD      DATE OF BIRTH:  October 15, 1951  DATE OF PROCEDURE:  02/08/2012 DATE OF DISCHARGE:                              OPERATIVE REPORT   SURGEON:  Estill Bamberg, MD  PREOPERATIVE DIAGNOSES: 1. L4-5 spinal stenosis. 2. Grade 2 L4-5 spondylolisthesis. 3. Bilateral lower extremity radiculopathy.  POSTOPERATIVE DIAGNOSES: 1. L4-5 spinal stenosis. 2. Grade 2 L4-5 spondylolisthesis. 3. Bilateral lower extremity radiculopathy.  PROCEDURE: 1. Right-sided L4-5 transforaminal lumbar interbody fusion. 2. Left-sided L4-5 posterolateral fusion. 3. Placement of posterior instrumentation, L4-L5. 4. Insertion of interbody device x1 (9 mm long Concorde bulleted cage. 5. Use of local autograft. 6. Intraoperative bone marrow aspiration from the patient's right     iliac crest using a separate incision. 7. Intraoperative use of fluoroscopy.  SURGEON:  Estill Bamberg, MD  ASSISTANT:  Janace Litten, OPA.  ANESTHESIA:  General endotracheal anesthesia.  COMPLICATIONS:  None.  DISPOSITION:  Stable.  ESTIMATED BLOOD LOSS:  400 mL.  INDICATIONS FOR PROCEDURE:  Briefly, [name] is a very pleasant 61- year-old female who initially presented to me on January 06, 2012, with severe right greater than left leg pain.  She did go previously to go forward with extensive conservative care including therapy.  In addition, multiple epidural injections as well as pain medications, anti- inflammatories, and therapy.  She did not get any long-term relief with conservative measures.  I did review an MRI, which was notable for rather profound spinal stenosis at the L4-5 level.  Radiographs did reveal a dynamic spondylolisthesis at L4-5 as well.  Given the patient's failure of nonoperative measures, we did discuss going forward with  an instrumented L4-5 decompression and fusion as noted above.  The patient fully understood the risks and limitations of the procedure as outlined in my preoperative note.  OPERATIVE DETAILS:  On February 08, 2012, the patient was brought to Surgery and general endotracheal anesthesia was administered.  The patient was placed prone on a flat Jackson bed with a Wilson frame.  All bony prominences were meticulously padded.  Vancomycin 1 g was given for antibiotic prophylaxis.  SCDs were placed.  A time-out procedure was performed.  I then obtained a lateral intraoperative radiograph to confirm the appropriate trajectory of the L4 and L5 pedicle screws.  I then made an incision from approximately spinous process of L3 to approximately spinous process of L5.  The fascia was readily identified, incised at the midline.  The paraspinal musculature was bluntly swept laterally.  At this point, I brought in lateral fluoroscopy to confirm the appropriate operative levels.  At this point, I did subperiosteally expose the posterior elements at L4 and L5 in addition to the L4-5 facet joint.  The transverse processes of L4 and L5 were also readily identified and subperiosteally exposed.  I then cannulated first the right and then the left pedicles using a 4-mm bur followed by a gearshift probe followed by a 6-mm tap.  I did use a ball-tip probe to confirm there  was no cortical violation with the tap.  I then placed 7 x 45 mm screws at L4 and L5 on the left side.  I did apply distraction across the L4-5 level, and I did also sink the L4 screw more than the L5 screw in order to obtain a partial reduction at the L4-5 level.  I then placed a 35-mm rod with distraction applied, I locked the L4-5 interspace into place.  I then turned my attention towards the patient's right side.  On the right side, I cannulated the pedicles and put bone over the cannulated holes.  I then performed a full and  complete facetectomy at L4 and L5.  Of note, there was excessive and significant facet hypertrophy clearly causing obvious neural foraminal and lateral recess stenosis.  This was removed entirely.  The exiting L4 nerve root was readily identified and retracted superiorly.  The traversing L5 nerve was also identified and gently mobilized medially.  The posterior aspect of the disk space was readily noted.  I then used a 15 blade knife to perform a posterolateral annulotomy.  I then used a series of curettes and paddle scrapers to perform a thorough diskectomy.  I prepared the endplates thoroughly.  At this point, I obtained 8 mL of bone marrow aspirate from the patient's right iliac crest using a separate incision, and this was mixed with 10 mL of the Vitoss BA. Autograft obtained from removing the facet joint was mixed with the Vitoss mixture.  I then liberally packed the interbody space using the Vitoss/bone marrow aspirate/autograft mixture.  I then placed a series of trials, and I did feel that a 9-mm trial would be the most appropriate fit.  I did use lateral fluoroscopy to confirm appropriate position and length of the interbody implant.  The implant was then packed with the bone marrow mixture.  I then tamped the 9-mm implant into the appropriate position using both AP and lateral fluoroscopy. The intervertebral space was additionally packed with the bone graft mixture.  7 x 45 mm screws were placed at L4-L5 on the right side and again, a 35-mm rod was placed.  Of note, I did test the screws prior to placing the rod, and there is no abnormal response below 20 milliamps. I then removed the distraction on the left side and caps were placed over the right-sided screws.  I then turned my attention towards the left side.  At this point, I removed the rod and tested the screws. Again, there was no abnormal response.  I then used a 4-mm bur to thoroughly decorticate the facet joint on the  left side.  In addition, the posterior elements of L4 and L5.  The transverse processes of L4-L5, were also decorticated.  The bone graft mixture was placed across the posterior elements in the posterolateral space.  I then again placed a 35 mm rod and caps were placed followed by a final locking procedure.  I then obtained AP and lateral radiographs and was extremely pleased with the appearance on the patient's radiographs.  Of note, the retractors were relaxed approximately every 30 minutes, and the wound was irrigated approximately every 30 minutes to minimize the chance of infection.  At this point, a #15 Blake drain was placed deep to the fascia on the patient's right side, the side of the TLIF.  I then closed the fascia using #1 Vicryl.  The subcutaneous layer was closed using 0 Vicryl followed by 2-0 Vicryl, and the skin was  closed using 4-0 Monocryl.  All instrument counts were correct at the termination of the procedure.  Of note, Janace Litten, was my assistant throughout the procedure and aided in essential retraction and suctioning required throughout the surgery.     Estill Bamberg, MD     MD/MEDQ  D:  02/08/2012  T:  02/09/2012  Job:  [id number]  cc:   Merlene Laughter. Renae Gloss, M.D.

## 2012-02-09 NOTE — Progress Notes (Signed)
Pt has an estraderm patch to R hip per order.

## 2012-02-09 NOTE — Progress Notes (Signed)
Physical Therapy Evaluation Patient Details Name: [name] MRN: [id number] DOB: 1951/01/15 Today's Date: 02/09/2012  Problem List: There is no problem list on file for this patient.   Past Medical History:  Past Medical History  Diagnosis Date  . Hypertension   . Arthritis   . GERD (gastroesophageal reflux disease)   . Constipation    Past Surgical History:  Past Surgical History  Procedure Date  . Abdominal hysterectomy   . Breast surgery     bio  neg cancer    PT Assessment/Plan/Recommendation PT Assessment Clinical Impression Statement: Pt presents with a medical diagnosis of L4-5 fusion with the following impairments/deficits and therapy diagnosis listed below. Pt will benefit from skilled PT in the acute care setting in order to maximize functional mobility. Treatment today limited by pt with feelings of lightheadedness and nausea upon sitting. Will continue to evaluation in further sessions. PT Recommendation/Assessment: Patient will need skilled PT in the acute care venue PT Problem List: Decreased activity tolerance;Decreased mobility;Decreased knowledge of use of DME;Decreased knowledge of precautions;Pain PT Therapy Diagnosis : Acute pain PT Plan PT Frequency: Min 5X/week PT Treatment/Interventions: DME instruction;Gait training;Stair training;Functional mobility training;Therapeutic activities;Therapeutic exercise;Patient/family education PT Recommendation Follow Up Recommendations: Home health PT;Supervision/Assistance - 24 hour Equipment Recommended: Rolling walker with 5" wheels PT Goals  Acute Rehab PT Goals PT Goal Formulation: With patient Time For Goal Achievement: 7 days Pt will go Supine/Side to Sit: with modified independence PT Goal: Supine/Side to Sit - Progress: Goal set today Pt will go Sit to Supine/Side: with modified independence PT Goal: Sit to Supine/Side - Progress: Goal set today Pt will go Sit to Stand: with modified independence PT  Goal: Sit to Stand - Progress: Goal set today Pt will go Stand to Sit: with modified independence PT Goal: Stand to Sit - Progress: Goal set today Pt will Transfer Bed to Chair/Chair to Bed: with supervision PT Transfer Goal: Bed to Chair/Chair to Bed - Progress: Goal set today Pt will Ambulate: 51 - 150 feet;with supervision;with least restrictive assistive device PT Goal: Ambulate - Progress: Goal set today  PT Evaluation Precautions/Restrictions  Precautions Precautions: Back Precaution Booklet Issued: Yes (comment) Required Braces or Orthoses: No Restrictions Weight Bearing Restrictions: No Prior Functioning  Home Living Lives With: Spouse;Son [place] Help From: Family Type of Home: House Home Layout: One level Home Access: Level entry Bathroom Shower/Tub: Tub/shower unit;Curtain Bathroom Toilet: Standard Bathroom Accessibility: Yes How Accessible: Accessible via walker Home Adaptive Equipment: None Prior Function Level of Independence: Independent with basic ADLs;Independent with homemaking with ambulation;Independent with gait;Independent with transfers Able to Take Stairs?: Yes Driving: Yes Vocation: Full time employment 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 Arousal/Alertness: Awake/alert Overall Cognitive Status: Appears within functional limits for tasks assessed Orientation Level: Oriented X4 Sensation/Coordination Sensation Light Touch: Impaired Detail Light Touch Impaired Details: Impaired LLE;Impaired RLE (decreased sensation toes) Extremity Assessment RLE Assessment RLE Assessment: Within Functional Limits LLE Assessment LLE Assessment: Within Functional Limits Mobility (including Balance) Bed Mobility Bed Mobility: Yes Rolling Left: 3: Mod assist;With rail Rolling Left Details (indicate cue type and reason): VC for sequencing to maintain back precautions. Assist with forward shoulder movement Left Sidelying to Sit: 3: Mod assist;HOB  elevated (comment degrees);With rails Left Sidelying to Sit Details (indicate cue type and reason): VC for sequencing to maintain back precautions. Assist with trunk into sitting. Cues throughout for hand placement. Sitting - Scoot to Edge of Bed: 4: Min assist Sitting - Scoot to [place] of Bed Details (indicate cue  type and reason): VC for weight shifting Sit to Supine: 4: Min assist;With rail;HOB elevated (comment degrees) Sit to Supine - Details (indicate cue type and reason): VC for sequencing. Assist with LEs into supine. Transfers Transfers: No Ambulation/Gait Ambulation/Gait: No    Exercise    End of Session PT - End of Session Equipment Utilized During Treatment: Gait belt Activity Tolerance: Patient limited by fatigue;Patient limited by pain Patient left: in bed;with call bell in reach Nurse Communication: Mobility status for transfers General Behavior During Session: [facility] for tasks performed Cognition: [facility] for tasks performed  Milana Kidney 02/09/2012, 1:31 PM  02/09/2012 Milana Kidney DPT PAGER: [phone] OFFICE: [phone]

## 2012-02-09 NOTE — Progress Notes (Signed)
OT Cancellation Note   Orders received for OT consult, will hold OT eval for today secondary to pt not feeling well.  Only tolerated sitting EOB with PT earlier.  Will attempt eval 02/10/12, thanks   Delontae Lamm OTR/L 02/09/2012, 3:39 PM  Pager [phone]

## 2012-02-09 NOTE — Progress Notes (Signed)
Patient comfortable, moderate LBP  AVSS (slightly hypotensive) NVI Dressing DCI  Drain: 120/10 hours UOP 300/12 hours  POD #1 after L4/5 decompression and fusion  - bolus 1 L NS for hypotension and UOP - up with PT today - maintain drain - oxycontin/percocet/valium - f/u AM cbc

## 2012-02-09 NOTE — Progress Notes (Signed)
Pt is s/p L4-L5 posterior lumbar fusion. Pt has a shadowed stained dressing to low back. Pt JP draining mod amount of bldy drainage. Pt repts that preop she had R and L leg pain, numbness and tingling with R leg being worse. Pt repts that her  preop L leg s/sx are "better to gone". R lower leg preop s/sx are "better". Pt repts that she now only has low back pain and some slight bil feet and toe numbness. Neuro check is otherwise negative. Dec ROM back related to surgery. Back precautions. Pt repts LBM 3/13. Bowel sounds are faint to nonexistent. Pt repts that she had nausea last night after morphine but is "better to gone" now. Abdomen is soft flat nontender and nondistended. Pt denies passing gas. Pt tolerates clear liquid diet fair. Lungs CTA. Heart rate regular rate and rhythm. No s/sx cardiac or resp distress and no c/o such. Pt repts nonprod cough. Pt performs IS per order. Foley draining mod amount of dark yellow urine without difficulty. Pt is alert and oriented x 3.

## 2012-02-10 NOTE — Progress Notes (Signed)
Patient comfortable, minimal LBP   BP 136/61  Pulse 96  Temp(Src) 97.2 F (36.2 C) (Oral)  Resp 16  Ht 5\' 6"  (1.676 m)  Wt 94.575 kg (208 lb 8 oz)  BMI 33.65 kg/m2  SpO2 100%  Patient sitting at edge of bed, looks comfortable NVI  Dressing DCI  Drain: 40/10 hours   POD #2 after L4/5 decompression and fusion  - drain d/c'ed - up with PT - continue pain regimen - likely d/c home sat vs. sunday

## 2012-02-10 NOTE — Progress Notes (Signed)
Utilization review completed. Assia Meanor, RN, BSN.  02/10/12  

## 2012-02-10 NOTE — Evaluation (Signed)
Occupational Therapy Evaluation Patient Details Name: [name] MRN: [id number] DOB: Aug 07, 1951 Today's Date: 02/10/2012  Problem List: There is no problem list on file for this patient.   Past Medical History:  Past Medical History  Diagnosis Date  . Hypertension   . Arthritis   . GERD (gastroesophageal reflux disease)   . Constipation    Past Surgical History:  Past Surgical History  Procedure Date  . Abdominal hysterectomy   . Breast surgery     bio  neg cancer    OT Assessment/Plan/Recommendation OT Assessment Clinical Impression Statement: This 61 y.o. female presents to OT s/ L4/5 decompression and fusion.  Pt. demonstrates the below listed deficits that limit her ability to perform BADLs at [facility].  She will benefit from OT to maximize safety and independence with BADLs to allow her to return home with supervision   OT Recommendation/Assessment: Patient will need skilled OT in the acute care venue OT Problem List: Decreased strength;Decreased activity tolerance;Impaired balance (sitting and/or standing);Decreased knowledge of use of DME or AE;Decreased knowledge of precautions;Impaired sensation;Pain (impaired sensation bil. feet) Barriers to Discharge: None OT Therapy Diagnosis : Generalized weakness;Acute pain OT Plan OT Frequency: Min 2X/week OT Treatment/Interventions: Self-care/ADL training;DME and/or AE instruction;Therapeutic activities;Patient/family education;Balance training OT Recommendation Follow Up Recommendations: Home health OT;Supervision/Assistance - 24 hour Equipment Recommended: 3 in 1 bedside comode;Rolling walker with 5" wheels Individuals Consulted Consulted and Agree with Results and Recommendations: Patient;Family member/caregiver Family Member Consulted: husband OT Goals Acute Rehab OT Goals OT Goal Formulation: With patient/family Time For Goal Achievement: 7 days ADL Goals Pt Will Perform Grooming: with supervision;Standing at sink ADL  Goal: Grooming - Progress: Goal set today Pt Will Perform Lower Body Bathing: with supervision;Sit to stand from chair;Sit to stand from bed ADL Goal: Lower Body Bathing - Progress: Goal set today Pt Will Perform Lower Body Dressing: with supervision;Sit to stand from chair;Sit to stand from bed ADL Goal: Lower Body Dressing - Progress: Goal set today Pt Will Transfer to Toilet: with supervision;Ambulation;3-in-1 ADL Goal: Toilet Transfer - Progress: Goal set today Pt Will Perform Toileting - Clothing Manipulation: with supervision;Standing ADL Goal: Toileting - Clothing Manipulation - Progress: Goal set today Pt Will Perform Toileting - Hygiene: with supervision;Sit to stand from 3-in-1/toilet ADL Goal: Toileting - Hygiene - Progress: Goal set today Pt Will Perform Tub/Shower Transfer: with min assist;Ambulation;Tub transfer ADL Goal: Tub/Shower Transfer - Progress: Goal set today  OT Evaluation Precautions/Restrictions  Precautions Precautions: Back Precaution Booklet Issued: Yes (comment) Required Braces or Orthoses: No Restrictions Weight Bearing Restrictions: No Prior Functioning Home Living Lives With: Spouse;Son [place] Help From: Family Type of Home: House Home Layout: One level Home Access: Level entry Bathroom Shower/Tub: Tub/shower unit;Curtain Bathroom Toilet: Standard Bathroom Accessibility: Yes How Accessible: Accessible via walker Home Adaptive Equipment: None Prior Function Level of Independence: Independent with basic ADLs;Independent with homemaking with ambulation;Independent with gait;Independent with transfers Able to Take Stairs?: Yes Driving: Yes Vocation: Full time employment Vocation Requirements: housekeeper ADL ADL Eating/Feeding: Simulated;Independent Where Assessed - Eating/Feeding: Bed level Grooming: Simulated;Wash/dry hands;Wash/dry face;Minimal assistance Where Assessed - Grooming: Standing at sink Upper Body Bathing: Simulated;Minimal  assistance Where Assessed - Upper Body Bathing: Sitting, chair Lower Body Bathing: Simulated;Minimal assistance Where Assessed - Lower Body Bathing: Sit to stand from chair Upper Body Dressing: Simulated;Minimal assistance Where Assessed - Upper Body Dressing: Sitting, chair Lower Body Dressing: Simulated;Performed;Minimal assistance (socks) Lower Body Dressing Details (indicate cue type and reason): Pt. able to cross ankles over knees to  don/doff socks Where Assessed - Lower Body Dressing: Sit to stand from chair;Sit to stand from bed Toilet Transfer: Simulated;Minimal assistance Toilet Transfer Method: Ambulating Toilet Transfer Equipment: Raised toilet seat with arms (or 3-in-1 over toilet) Toileting - Clothing Manipulation: Simulated;Minimal assistance Where Assessed - Glass blower/designer Manipulation: Standing Toileting - Hygiene: Simulated;Minimal assistance Where Assessed - Toileting Hygiene: Sit to stand from 3-in-1 or toilet Equipment Used: Rolling walker Ambulation Related to ADLs: Pt. ambulates with min a ADL Comments: Pt. requires increased time to complete tasks.  Pt. able to cross ankles over knees to access feet for LB ADLs.  Husband and son present, and will be able to provide assist at d/c Vision/Perception    Cognition Cognition Arousal/Alertness: Awake/alert Overall Cognitive Status: Appears within functional limits for tasks assessed Orientation Level: Oriented X4 Sensation/Coordination   Extremity Assessment RUE Assessment RUE Assessment: Within Functional Limits LUE Assessment LUE Assessment: Within Functional Limits Mobility  Bed Mobility Bed Mobility: Yes Rolling Left: 5: Supervision;With rail Left Sidelying to Sit: 5: Supervision;HOB flat;With rails Sitting - Scoot to Edge of Bed: 5: Supervision Transfers Transfers: Yes Sit to Stand: 4: Min assist;From chair/3-in-1;From bed;With upper extremity assist Stand to Sit: 4: Min assist;To chair/3-in-1;To  bed;With upper extremity assist Exercises   End of Session OT - End of Session Activity Tolerance: Patient limited by pain Patient left: in chair;with call bell in reach Nurse Communication: Mobility status for ambulation General Behavior During Session: [facility] for tasks performed Cognition: [facility] for tasks performed   Jerelene Salaam M 02/10/2012, 2:49 PM

## 2012-02-10 NOTE — Progress Notes (Signed)
Pt is s/p posterior lumbar fusion of L4-5 due to preop bil leg numbness, tingling and pain with s/sx being worse on the R leg. Pt neuro check is negative except pt repts a "little numbess of toes and feet".  Pt repts that her preop s/sx are "gone". Pt now c/o low back pain primarily at incision site. Pt is being controlled with prescribed pain regimen. MD in this AM to see pt and drain d/ced per him. Pt dressing of low back is shadowed with old serosanguinous drainage. Pt lungs CTA. Heart rate regular rate and rhythm. No s/sx cardiac or resp distress and no c/o such. Vital signs stable. Pt repts nonprod cough. Pt performing IS per order. Plan is to d/c foley this AM. Pt foley draining mod amount of pale yellow urine. Abdomen is soft flat nontender and nondistended. BS hypoactive x 4. Pt bowel sounds yesterday were faint to nonexistent. Pt denies nausea or vomiting. Pt denies passing gas. Pt is tolerating clear liquid diet without problems.

## 2012-02-10 NOTE — Progress Notes (Signed)
Physical Therapy Treatment Note   02/10/12 0935  PT Visit Information  Last PT Received On 02/10/12  Precautions  Precautions Back  Precaution Booklet Issued Yes (comment)  Required Braces or Orthoses No  Restrictions  Weight Bearing Restrictions No  Bed Mobility  Rolling Left 4: Min assist  Rolling Left Details (indicate cue type and reason) vc for sequencing to maintain back precautions, assist with trunk  Left Sidelying to Sit 4: Min assist  Left Sidelying to Sit Details (indicate cue type and reason) assist for trunk elevation  Transfers  Sit to Stand 3: Mod assist  Sit to Stand Details (indicate cue type and reason) underarm assist due to posterior weight-shift, vc for safety and technique  Stand to Sit 4: Min assist (vc for hand placement)  Ambulation/Gait  Ambulation/Gait Yes  Ambulation/Gait Assistance 4: Min assist  Ambulation/Gait Assistance Details (indicate cue type and reason) patient with extremely short step length with shuffled gait pattern, patient reports bilat foot numbness most likely contributing to shuffling gait pattern  Ambulation Distance (Feet) 20 Feet  Assistive device Rolling walker  Gait Pattern Step-through pattern;Decreased stride length  Gait velocity significantly decreased  Stairs No  PT - End of Session  Equipment Utilized During Treatment Gait belt  Activity Tolerance Patient tolerated treatment well  Patient left in chair;with call bell in reach  Nurse Communication Mobility status for transfers  General  Behavior During Session [facility] for tasks performed  Cognition [facility] for tasks performed  PT - Assessment/Plan  Comments on Treatment Session Patient with c/o of bilat LE foot numbness which is contributing to imparied gait. Patient did tolerate ambulation better this date but still requires assist for transfers.  PT Plan Discharge plan remains appropriate;Frequency remains appropriate  PT Frequency Min 5X/week  Follow Up Recommendations Home  health PT;Supervision/Assistance - 24 hour  Equipment Recommended Rolling walker with 5" wheels  Acute Rehab PT Goals  PT Goal: Supine/Side to Sit - Progress Progressing toward goal  PT Goal: Sit to Supine/Side - Progress Progressing toward goal  PT Goal: Sit to Stand - Progress Progressing toward goal  PT Goal: Stand to Sit - Progress Progressing toward goal  PT Transfer Goal: Bed to Chair/Chair to Bed - Progress Progressing toward goal  PT Goal: Ambulate - Progress Progressing toward goal    Pain: 5/10 low back pain  Lewis Shock, PT, DPT Pager #: [phone] Office #: [phone]

## 2012-02-10 NOTE — Progress Notes (Signed)
Patient comfortable, minimal LBP   Patient looks comfortable BP 120/74  Pulse 95  Temp(Src) 99.2 F (37.3 C) (Oral)  Resp 16  Ht 5\' 6"  (1.676 m)  Wt 94.575 kg (208 lb 8 oz)  BMI 33.65 kg/m2  SpO2 98%  NVI  Dressing CDI   Hg 10.7 on 3/14  POD #3 after L4/5 decompression and fusion - up with PT  - continue pain regimen  - d/c home today after regular diet

## 2012-02-11 NOTE — Progress Notes (Signed)
   CARE MANAGEMENT NOTE 02/11/2012  Patient:  MASHAL, SLAVICK   Account Number:  [id number]  Date Initiated:  02/11/2012  Documentation initiated by:  [facility]  Subjective/Objective Assessment:   HTN, GERD     Action/Plan:   Anticipated DC Date:  02/11/2012   Anticipated DC Plan:  HOME/SELF CARE      DC Planning Services  CM consult      Choice offered to / List presented to:     DME arranged  3-N-1  WALKER      DME agency  [facility]        Status of service:  Completed, signed off Medicare Important Message given?   (If response is "NO", the following Medicare IM given date fields will be blank) Date Medicare IM given:   Date Additional Medicare IM given:    Discharge Disposition:  HOME/SELF CARE  Per UR Regulation:    If discussed at Long Length of Stay Meetings, dates discussed:    Comments:  02/11/2012 1030 Contacted [facility] for RW and 3N1 for home. Isidoro Donning RN CCM Case Mgmt phone [phone]

## 2012-02-11 NOTE — Progress Notes (Signed)
Physical Therapy Treatment Note   02/11/12 1010  PT Visit Information  Last PT Received On 02/11/12  Precautions  Precautions Back  Precaution Booklet Issued Yes (comment)  Restrictions  Weight Bearing Restrictions No  Bed Mobility  Rolling Left 5: Supervision  Rolling Left Details (indicate cue type and reason) verbal cues to maintain log roll and not twist  Left Sidelying to Sit 5: Supervision  Sit to Supine 5: Supervision  Sit to Supine - Details (indicate cue type and reason) verbal cues to adhere to sidelying --> logrolling to eliminate twisting  Transfers  Sit to Stand 5: Supervision  Sit to Stand Details (indicate cue type and reason) increased time  Stand to Sit 5: Supervision  Stand to Sit Details increased time  Ambulation/Gait  Ambulation/Gait Assistance 5: Supervision  Ambulation/Gait Assistance Details (indicate cue type and reason) patient with increased step length compared to yesterday.  Ambulation Distance (Feet) 100 Feet  Assistive device Rolling walker  Gait Pattern Step-to pattern;Decreased stride length  Gait velocity decreased but improved from yesterday  Stairs Yes  Stairs Assistance 4: Min assist (contact guard)  Stairs Assistance Details (indicate cue type and reason) directional verbal cues  Stair Management Technique Forwards;With walker  Number of Stairs 1  (to mimic home entry)  Wheelchair Mobility  Wheelchair Mobility No  Posture/Postural Control  Posture/Postural Control (back precautions)  PT - End of Session  Activity Tolerance Patient tolerated treatment well  Patient left in chair;with call bell in reach  Nurse Communication (RN notified patient equip has yet to be delivered)  General  Behavior During Session [facility] for tasks performed  Cognition [facility] for tasks performed  PT - Assessment/Plan  Comments on Treatment Session Patient with significant transfer and ambulation ability this date however still reports bilat foot numbness/tingling  but decresaed compared to yesterday. Patient safe to return home with 24/7 assist with spouse. Practiced bed mobility a few times due to patient wanting to twist pt now safe.  PT Plan Discharge plan remains appropriate;Frequency remains appropriate  PT Frequency Min 5X/week  Follow Up Recommendations Home health PT;Supervision/Assistance - 24 hour  Equipment Recommended Rolling walker with 5" wheels;3 in 1 bedside comode  Acute Rehab PT Goals  PT Goal: Supine/Side to Sit - Progress Progressing toward goal  PT Goal: Sit to Supine/Side - Progress Progressing toward goal  PT Goal: Sit to Stand - Progress Progressing toward goal  PT Goal: Stand to Sit - Progress Progressing toward goal  PT Transfer Goal: Bed to Chair/Chair to Bed - Progress Progressing toward goal  PT Goal: Ambulate - Progress Progressing toward goal    Pain: 5/10 surgical back pain  Lewis Shock, PT, DPT Pager #: [phone] Office #: [phone]

## 2012-02-11 NOTE — Progress Notes (Signed)
Occupational Therapy Treatment Patient Details Name: [name] MRN: [id number] DOB: September 03, 1951 Today's Date: 02/11/2012 9:51-10:15  2sc OT Assessment/Plan OT Assessment/Plan Comments on Treatment Session: Pt doing well overall supervision for mobility and simulated selfcare tasks.  Will have supervision from family at discharge, but will benefit from follow-up HHOT eval at discharge. OT Plan: Discharge plan remains appropriate OT Frequency: Min 2X/week Follow Up Recommendations: Home health OT Equipment Recommended: 3 in 1 bedside comode OT Goals ADL Goals ADL Goal: Toilet Transfer - Progress: Met ADL Goal: Toileting - Clothing Manipulation - Progress: Met ADL Goal: Toileting - Hygiene - Progress: Met ADL Goal: Tub/Shower Transfer - Progress: Met  OT Treatment Precautions/Restrictions  Precautions Precautions: Back Required Braces or Orthoses: No Restrictions Weight Bearing Restrictions: No   ADL ADL Toilet Transfer: Buyer, retail Method: Proofreader: Raised toilet seat with arms (or 3-in-1 over toilet) Toileting - Clothing Manipulation: Simulated;Modified independent Where Assessed - Toileting Clothing Manipulation: Sit to stand from 3-in-1 or toilet Toileting - Hygiene: Simulated;Supervision/safety Where Assessed - Toileting Hygiene: Sit to stand from 3-in-1 or toilet Tub/Shower Transfer: Performed;Supervision/safety Tub/Shower Transfer Method: Ambulating Ambulation Related to ADLs: Pt supervision for mobility with RW.  Needs occasional min instructional cue for longer step length and to increase speed.  Still with slight forward trunk flexion in standing as well. ADL Comments: Pt much improved today.  discussed back precautions with pt able to independently state 2/3.  Demonstrated reacher and it's functional use for retrieving objects from floor. Also discussed use of 3:1 in the tub vs using a plastic lawn chair.     Mobility  Bed Mobility Bed Mobility: Yes Rolling Left: 5: Supervision Rolling Left Details (indicate cue type and reason): min instructional cueing for technique to roll (bending up LEs first) Left Sidelying to Sit: 5: Supervision;HOB flat Sitting - Scoot to Edge of Bed: 5: Supervision Transfers Transfers: Yes Sit to Stand: 5: Supervision;From bed;With upper extremity assist  End of Session OT - End of Session Equipment Utilized During Treatment: Other (comment) (rolling walker) Activity Tolerance: Patient tolerated treatment well Patient left: with family/visitor present;Other (comment) (Left pt with Morrie Sheldon from PT for further therapy.) General Behavior During Session: [facility] for tasks performed Cognition: [facility] for tasks performed  Caedyn Raygoza OTR/L 02/11/2012, 10:26 AM

## 2012-02-11 NOTE — Discharge Summary (Signed)
[name], [name]                 ACCOUNT NO.:  [id number]  MEDICAL RECORD NO.:  [id number]  LOCATION:  5034                         FACILITY:  [facility]  PHYSICIAN:  Estill Bamberg, MD      DATE OF BIRTH:  Sep 18, 1951  DATE OF ADMISSION:  02/08/2012 DATE OF DISCHARGE:  02/11/2012                              DISCHARGE SUMMARY   ADMISSION DIAGNOSES:  L4-5 spinal stenosis and grade 2 L4-5 spondylolisthesis.  DISCHARGE DIAGNOSES:  L4-5 spinal stenosis and grade 2 L4-5 spondylolisthesis.  ADMITTING PHYSICIAN:  Estill Bamberg, MD  ADMISSION HISTORY:  Briefly, Ms. [name] is a very pleasant 61 year old female who presented to me with severe debilitating pain in her bilateral legs.  The patient was noted to have severe spinal stenosis at the L4-5 level and did fail extensive forms of conservative care.  We therefore did have a discussion regarding going forward with operative intervention.  The patient was therefore admitted on February 08, 2012, for the procedure noted above.  HOSPITAL COURSE:  On February 08, 2012, the patient was brought to surgery and underwent the procedure noted above.  The patient was tolerated the procedure well, was transferred to recovery room in stable condition. The patient did note a significant improvement in both the right and her left leg pain.  She did have back pain, as expected, but this did improve over time.  The patient was progressively mobilized with the Physical Therapy team throughout her hospital stay.  On the morning of postop day #3, the patient's leg pain was entirely resolved and her back pain was minimal.  She was uneventfully discharged home.  Of note, the patient did complain of numbness in her bilateral feet following her procedure.  I did go on and let her know that I did feel that this was unlikely to be related to any pathology related to compress the nerves in her lumbar spine.  It may be secondary to medical conditions such as peripheral  neuropathy.  I did also go on and let her know that if this was in fact related to her previous compressed nerves, then numbness can often times be permanent and if it does get better, it does take time to get better and the goals of surgical intervention for condition such as hers is to address her leg pain.  DISCHARGE INSTRUCTIONS:  The patient will take Percocet for pain and Valium for spasms.  She will follow up in my office in approximately 2 weeks after her procedure.  She will adhere to back precautions at all times and her dressing will be removed on postop day #5.  At which point, she can shower.     Estill Bamberg, MD    MD/MEDQ  D:  02/11/2012  T:  02/11/2012  Job:  [id number]  cc:   Merlene Laughter. Renae Gloss, M.D.

## 2012-02-13 LAB — POCT I-STAT 4, (NA,K, GLUC, HGB,HCT)
Glucose, Bld: 111 mg/dL — ABNORMAL HIGH (ref 70–99)
HCT: 23 % — ABNORMAL LOW (ref 36.0–46.0)
Hemoglobin: 7.8 g/dL — ABNORMAL LOW (ref 12.0–15.0)
Potassium: 3.7 mEq/L (ref 3.5–5.1)
Sodium: 137 mEq/L (ref 135–145)

## 2013-01-26 HISTORY — PX: BREAST BIOPSY: SHX20

## 2014-12-01 ENCOUNTER — Other Ambulatory Visit: Payer: Self-pay

## 2017-11-28 HISTORY — PX: EYE SURGERY: SHX253

## 2018-01-17 ENCOUNTER — Other Ambulatory Visit: Payer: Self-pay | Admitting: Orthopedic Surgery

## 2018-01-17 DIAGNOSIS — M5416 Radiculopathy, lumbar region: Secondary | ICD-10-CM

## 2018-01-23 ENCOUNTER — Ambulatory Visit
Admission: RE | Admit: 2018-01-23 | Discharge: 2018-01-23 | Disposition: A | Discharge status: Home / Self Care | Payer: Medicare Other | Source: Ambulatory Visit | Attending: Orthopedic Surgery | Admitting: Orthopedic Surgery

## 2018-01-23 DIAGNOSIS — M5416 Radiculopathy, lumbar region: Secondary | ICD-10-CM

## 2018-01-23 MED ORDER — IOPAMIDOL (ISOVUE-M 200) INJECTION 41%
1.0000 mL | Freq: Once | INTRAMUSCULAR | Status: AC
  Administered 2018-01-23: 1 mL via EPIDURAL

## 2018-01-23 MED ORDER — METHYLPREDNISOLONE ACETATE 40 MG/ML INJ SUSP (RADIOLOG
120.0000 mg | Freq: Once | INTRAMUSCULAR | Status: AC
  Administered 2018-01-23: 120 mg via EPIDURAL

## 2018-01-23 NOTE — Discharge Instructions (Signed)

## 2018-10-15 ENCOUNTER — Ambulatory Visit (INDEPENDENT_AMBULATORY_CARE_PROVIDER_SITE_OTHER): Payer: Medicare Other | Admitting: Nurse Practitioner

## 2018-10-15 ENCOUNTER — Encounter: Payer: Self-pay | Admitting: Nurse Practitioner

## 2018-10-15 VITALS — BP 112/70 | HR 76 | Temp 98.1°F | Ht 66.0 in | Wt 200.6 lb

## 2018-10-15 DIAGNOSIS — R0789 Other chest pain: Secondary | ICD-10-CM

## 2018-10-15 DIAGNOSIS — J3089 Other allergic rhinitis: Secondary | ICD-10-CM | POA: Diagnosis not present

## 2018-10-15 DIAGNOSIS — Z8241 Family history of sudden cardiac death: Secondary | ICD-10-CM

## 2018-10-15 DIAGNOSIS — Z7982 Long term (current) use of aspirin: Secondary | ICD-10-CM

## 2018-10-15 DIAGNOSIS — K219 Gastro-esophageal reflux disease without esophagitis: Secondary | ICD-10-CM

## 2018-10-15 DIAGNOSIS — I10 Essential (primary) hypertension: Secondary | ICD-10-CM

## 2018-10-15 MED ORDER — MOMETASONE FUROATE 50 MCG/ACT NA SUSP: 2.0000 | Freq: Every day | NASAL | 2 refills | Status: DC

## 2018-10-15 MED ORDER — PREDNISONE 10 MG (21) PO TBPK: ORAL_TABLET | ORAL | 0 refills | Status: DC

## 2018-10-15 NOTE — Progress Notes (Signed)
Subjective:     Patient ID: [name] , female    DOB: Mar 14, 1951 , 67 y.o.   MRN: [id number]   Chief Complaint  Patient presents with  . Chest Pain    c/o of chest soreness and ocasional pain     HPI  Chest Pain   This is a recurrent problem. The current episode started more than 1 month ago. The onset quality is sudden. The problem occurs intermittently. The problem has been gradually worsening. The pain is present in the substernal region. The pain is at a severity of 5/10. The pain is moderate. The quality of the pain is described as dull. The pain does not radiate. Pertinent negatives include no abdominal pain, dizziness, headaches, irregular heartbeat, malaise/fatigue, nausea, numbness, palpitations, syncope or vomiting. The pain is aggravated by lifting. She has tried nothing for the symptoms. The treatment provided no relief. Risk factors include sedentary lifestyle and obesity.  Her past medical history is significant for hypertension.  Pertinent negatives for past medical history include no MI.  Her family medical history is significant for CAD (father passed with MI sudden death) and sudden death.     Past Medical History:  Diagnosis Date  . Arthritis   . Constipation   . GERD (gastroesophageal reflux disease)   . Hypertension      Family History  Problem Relation Age of Onset  . Diabetes Mother   . Heart attack Father   . Cancer Brother   . Anesthesia problems Neg Hx      Current Outpatient Medications:  .  alendronate (FOSAMAX) 70 MG tablet, PLEASE SEE ATTACHED FOR DETAILED DIRECTIONS, Disp: , Rfl: 2 .  aspirin 81 MG tablet, Take 81 mg by mouth daily., Disp: , Rfl:  .  dexlansoprazole (DEXILANT) 60 MG capsule, Take 60 mg by mouth daily., Disp: , Rfl:  .  lisinopril-hydrochlorothiazide (PRINZIDE,ZESTORETIC) 20-12.5 MG tablet, , Disp: , Rfl:  .  simvastatin (ZOCOR) 40 MG tablet, Take 40 mg by mouth every evening., Disp: , Rfl:    Allergies  Allergen  Reactions  . Penicillins Hives     Review of Systems  Constitutional: Negative for malaise/fatigue.  Cardiovascular: Positive for chest pain. Negative for palpitations and syncope.  Gastrointestinal: Negative for abdominal pain, nausea and vomiting.  Musculoskeletal: Negative.  Negative for myalgias.  Skin: Negative.   Neurological: Negative for dizziness, numbness and headaches.     Today's Vitals   10/15/18 1515  BP: 112/70  Pulse: 76  Temp: 98.1 F (36.7 C)  TempSrc: Oral  SpO2: 98%  Weight: 200 lb 9.6 oz (91 kg)  Height: 5\' 6"  (1.676 m)   Body mass index is 32.38 kg/m.   Objective:  Physical Exam  Constitutional: She is oriented to person, place, and time. She appears well-developed and well-nourished.  Cardiovascular: Regular rhythm.  No murmur heard. Pulmonary/Chest: Effort normal and breath sounds normal.  Musculoskeletal:       Arms: Neurological: She is alert and oriented to person, place, and time.  Skin: Skin is warm and dry. Capillary refill takes less than 2 seconds.        Assessment And Plan:     1. Chest discomfort  Costochondritis vs GERD  Will treat with prednisone taper, has been taking ibuprofen  2. Gastroesophageal reflux disease without esophagitis  Dexilant daily  Advised to take probiotic daily.  I have discussed the importance of avoiding fried and fatty foods.    3. Non-seasonal allergic rhinitis, unspecified trigger  Bilateral turbinates with moderate hypertrophy - mometasone (NASONEX) 50 MCG/ACT nasal spray; Place 2 sprays into the nose daily.  Dispense: 17 g; Refill: 2       Arnette FeltsJanece Moore, FNP

## 2018-10-15 NOTE — Patient Instructions (Signed)
   Take over the counter lactobacillus lowest dose for the reflux  Avoid fried and fatty foods.     Gastroesophageal Reflux Disease, Adult Normally, food travels down the esophagus and stays in the stomach to be digested. If a person has gastroesophageal reflux disease (GERD), food and stomach acid move back up into the esophagus. When this happens, the esophagus becomes sore and swollen (inflamed). Over time, GERD can make small holes (ulcers) in the lining of the esophagus. Follow these instructions at home: Diet  Follow a diet as told by your doctor. You may need to avoid foods and drinks such as: ? Coffee and tea (with or without caffeine). ? Drinks that contain alcohol. ? Energy drinks and sports drinks. ? Carbonated drinks or sodas. ? Chocolate and cocoa. ? Peppermint and mint flavorings. ? Garlic and onions. ? Horseradish. ? Spicy and acidic foods, such as peppers, chili powder, curry powder, vinegar, hot sauces, and BBQ sauce. ? Citrus fruit juices and citrus fruits, such as oranges, lemons, and limes. ? Tomato-based foods, such as red sauce, chili, salsa, and pizza with red sauce. ? Fried and fatty foods, such as donuts, french fries, potato chips, and high-fat dressings. ? High-fat meats, such as hot dogs, rib eye steak, sausage, ham, and bacon. ? High-fat dairy items, such as whole milk, butter, and cream cheese.  Eat small meals often. Avoid eating large meals.  Avoid drinking large amounts of liquid with your meals.  Avoid eating meals during the 2-3 hours before bedtime.  Avoid lying down right after you eat.  Do not exercise right after you eat. General instructions  Pay attention to any changes in your symptoms.  Take over-the-counter and prescription medicines only as told by your doctor. Do not take aspirin, ibuprofen, or other NSAIDs unless your doctor says it is okay.  Do not use any tobacco products, including cigarettes, chewing tobacco, and  e-cigarettes. If you need help quitting, ask your doctor.  Wear loose clothes. Do not wear anything tight around your waist.  Raise (elevate) the head of your bed about 6 inches (15 cm).  Try to lower your stress. If you need help doing this, ask your doctor.  If you are overweight, lose an amount of weight that is healthy for you. Ask your doctor about a safe weight loss goal.  Keep all follow-up visits as told by your doctor. This is important. Contact a doctor if:  You have new symptoms.  You lose weight and you do not know why it is happening.  You have trouble swallowing, or it hurts to swallow.  You have wheezing or a cough that keeps happening.  Your symptoms do not get better with treatment.  You have a hoarse voice. Get help right away if:  You have pain in your arms, neck, jaw, teeth, or back.  You feel sweaty, dizzy, or light-headed.  You have chest pain or shortness of breath.  You throw up (vomit) and your throw up looks like blood or coffee grounds.  You pass out (faint).  Your poop (stool) is bloody or black.  You cannot swallow, drink, or eat. This information is not intended to replace advice given to you by your health care provider. Make sure you discuss any questions you have with your health care provider. Document Released: 05/02/2008 Document Revised: 04/21/2016 Document Reviewed: 03/11/2015 Elsevier Interactive Patient Education  [employer]2018 [employer]

## 2019-01-02 ENCOUNTER — Encounter: Payer: Self-pay | Admitting: Internal Medicine

## 2019-01-02 ENCOUNTER — Ambulatory Visit: Payer: Self-pay

## 2019-01-03 ENCOUNTER — Ambulatory Visit: Payer: Self-pay

## 2019-01-03 ENCOUNTER — Encounter: Payer: Self-pay | Admitting: Internal Medicine

## 2019-01-07 ENCOUNTER — Other Ambulatory Visit: Payer: Self-pay | Admitting: Nurse Practitioner

## 2019-01-23 ENCOUNTER — Encounter: Payer: Medicare Other | Admitting: Nurse Practitioner

## 2019-01-23 ENCOUNTER — Ambulatory Visit: Payer: Medicare Other | Admitting: Nurse Practitioner

## 2019-01-23 ENCOUNTER — Ambulatory Visit (INDEPENDENT_AMBULATORY_CARE_PROVIDER_SITE_OTHER): Payer: Medicare Other

## 2019-01-23 ENCOUNTER — Encounter: Payer: Self-pay | Admitting: Nurse Practitioner

## 2019-01-23 VITALS — BP 122/72 | HR 79 | Temp 98.3°F | Ht 64.4 in | Wt 201.0 lb

## 2019-01-23 VITALS — BP 122/72 | HR 79 | Ht 64.4 in | Wt 201.2 lb

## 2019-01-23 DIAGNOSIS — Z1159 Encounter for screening for other viral diseases: Secondary | ICD-10-CM

## 2019-01-23 DIAGNOSIS — R7303 Prediabetes: Secondary | ICD-10-CM

## 2019-01-23 DIAGNOSIS — Z23 Encounter for immunization: Secondary | ICD-10-CM | POA: Diagnosis not present

## 2019-01-23 DIAGNOSIS — R413 Other amnesia: Secondary | ICD-10-CM

## 2019-01-23 DIAGNOSIS — Z13820 Encounter for screening for osteoporosis: Secondary | ICD-10-CM

## 2019-01-23 DIAGNOSIS — I10 Essential (primary) hypertension: Secondary | ICD-10-CM | POA: Diagnosis not present

## 2019-01-23 DIAGNOSIS — E782 Mixed hyperlipidemia: Secondary | ICD-10-CM

## 2019-01-23 DIAGNOSIS — Z0000 Encounter for general adult medical examination without abnormal findings: Secondary | ICD-10-CM

## 2019-01-23 DIAGNOSIS — Z780 Asymptomatic menopausal state: Secondary | ICD-10-CM

## 2019-01-23 LAB — POCT URINALYSIS DIP (PROADVANTAGE DEVICE)
Bilirubin, UA: NEGATIVE
Glucose, UA: NEGATIVE mg/dL
Ketones, POC UA: NEGATIVE mg/dL
Leukocytes, UA: NEGATIVE
Nitrite, UA: NEGATIVE
Protein Ur, POC: NEGATIVE mg/dL
Specific Gravity, Urine: 1.015
Urobilinogen, Ur: NEGATIVE
pH, UA: 6.5 (ref 5.0–8.0)

## 2019-01-23 MED ORDER — LISINOPRIL-HYDROCHLOROTHIAZIDE 20-12.5 MG PO TABS: 1.0000 | ORAL_TABLET | Freq: Every day | ORAL | 1 refills | Status: DC

## 2019-01-23 MED ORDER — PNEUMOCOCCAL 13-VAL CONJ VACC IM SUSP
0.5000 mL | INTRAMUSCULAR | 0 refills | Status: AC
Start: 2019-01-23 — End: 2019-01-23

## 2019-01-23 MED ORDER — SIMVASTATIN 40 MG PO TABS: 40.0000 mg | ORAL_TABLET | Freq: Every day | ORAL | 1 refills | Status: DC

## 2019-01-23 NOTE — Progress Notes (Signed)
Subjective:     Patient ID: [name] , female    DOB: 1951/05/02 , 68 y.o.   MRN: [id number]   Chief Complaint  Patient presents with  . physical    physical    The patient states she uses status post hysterectomy for birth control. Last LMP was No LMP recorded. Patient has had a hysterectomy.. Negative for Dysmenorrhea and Negative for Menorrhagia Mammogram last done January 02, 2019 [facility]).  Negative for: breast discharge, breast lump(s), breast pain and breast self exam.  Pertinent negatives include abnormal bleeding (hematology), anxiety, decreased libido, depression, difficulty falling sleep, dyspareunia, history of infertility, nocturia, sexual dysfunction, sleep disturbances, urinary incontinence, urinary urgency, vaginal discharge and vaginal itching. Diet regular. The patient states her exercise level is  none.      The patient's tobacco use is:  Social History   Tobacco Use  Smoking Status Never Smoker  Smokeless Tobacco Never Used   She has been exposed to passive smoke. The patient's alcohol use is:   Social History   Substance and Sexual Activity  Alcohol Use No   Additional information: Last pap hysterectomy.    HPI  HPI   Past Medical History:  Diagnosis Date  . Arthritis   . Constipation   . GERD (gastroesophageal reflux disease)   . Hypertension      Family History  Problem Relation Age of Onset  . Diabetes Mother   . Heart attack Father   . Cancer Brother   . Anesthesia problems Neg Hx      Current Outpatient Medications:  .  alendronate (FOSAMAX) 70 MG tablet, PLEASE SEE ATTACHED FOR DETAILED DIRECTIONS, Disp: , Rfl: 2 .  aspirin 81 MG tablet, Take 81 mg by mouth daily., Disp: , Rfl:  .  dexlansoprazole (DEXILANT) 60 MG capsule, Take 60 mg by mouth daily., Disp: , Rfl:  .  lisinopril-hydrochlorothiazide (PRINZIDE,ZESTORETIC) 20-12.5 MG tablet, TAKE 1 TABLET BY MOUTH EVERY DAY, Disp: 30 tablet, Rfl: 0 .  simvastatin (ZOCOR) 40 MG tablet,  TAKE 1 TABLET BY MOUTH EVERY DAY, Disp: 30 tablet, Rfl: 0   Allergies  Allergen Reactions  . Penicillins Hives     Review of Systems  Constitutional: Negative.   HENT: Negative.   Eyes: Negative.   Respiratory: Negative.   Cardiovascular: Negative.   Gastrointestinal: Negative.   Endocrine: Negative.   Genitourinary: Negative.   Musculoskeletal: Negative.   Skin: Negative.   Allergic/Immunologic: Negative.   Neurological: Negative.   Hematological: Negative.   Psychiatric/Behavioral: Negative.      Today's Vitals   01/23/19 1423  BP: 122/72  Pulse: 79  Weight: 201 lb 3.2 oz (91.3 kg)  Height: 5' 4.4" (1.636 m)   Body mass index is 34.11 kg/m.   Objective:  Physical Exam Constitutional:      Appearance: Normal appearance. She is well-developed.  HENT:     Head: Normocephalic and atraumatic.     Right Ear: Hearing, tympanic membrane, ear canal and external ear normal.     Left Ear: Hearing, tympanic membrane, ear canal and external ear normal.     Nose: Nose normal.     Mouth/Throat:     Mouth: Mucous membranes are moist.  Eyes:     General: Lids are normal.     Conjunctiva/sclera: Conjunctivae normal.     Pupils: Pupils are equal, round, and reactive to light.     Funduscopic exam:    Right eye: No papilledema.  Left eye: No papilledema.  Neck:     Musculoskeletal: Full passive range of motion without pain, normal range of motion and neck supple.     Thyroid: No thyroid mass.     Vascular: No carotid bruit.  Cardiovascular:     Rate and Rhythm: Normal rate and regular rhythm.     Pulses: Normal pulses.     Heart sounds: Normal heart sounds. No murmur.  Pulmonary:     Effort: Pulmonary effort is normal.     Breath sounds: Normal breath sounds.  Abdominal:     General: Abdomen is flat. Bowel sounds are normal.     Palpations: Abdomen is soft.  Musculoskeletal: Normal range of motion.        General: No swelling.     Right lower leg: No edema.      Left lower leg: No edema.  Skin:    General: Skin is warm and dry.     Capillary Refill: Capillary refill takes less than 2 seconds.  Neurological:     General: No focal deficit present.     Mental Status: She is alert and oriented to person, place, and time.     Cranial Nerves: No cranial nerve deficit.     Sensory: No sensory deficit.  Psychiatric:        Mood and Affect: Mood normal.        Behavior: Behavior normal.        Thought Content: Thought content normal.        Judgment: Judgment normal.         Assessment And Plan:     1. Essential hypertension . B/P is controlled.  . CMP ordered to check renal function.  . The importance of regular exercise and dietary modification was stressed to the patient.  . Stressed importance of losing ten percent of her body weight to help with B/P control.  . The weight loss would help with decreasing cardiac and cancer risk as well.  - POCT Urinalysis DIP (Proadvantage Device) - CBC no Diff - CMP14 + Anion Gap - lisinopril-hydrochlorothiazide (PRINZIDE,ZESTORETIC) 20-12.5 MG tablet; Take 1 tablet by mouth daily.  Dispense: 90 tablet; Refill: 1  2. Health maintenance examination . Behavior modifications discussed and diet history reviewed.   . Pt will continue to exercise regularly and modify diet with low GI, plant based foods and decrease intake of processed foods.  . Recommend intake of daily multivitamin, Vitamin D, and calcium.  . Recommend mammogram and colonoscopy for preventive screenings, as well as recommend immunizations that include influenza, TDAP, and Shingles   3. Encounter for hepatitis C screening test for low risk patient  Will check for Hepatitis C screening due to being born between the years 1945-1965 - Hepatitis C antibody  4. Mixed hyperlipidemia  Chronic, controlled  Continue with current medications  Denies muscle aches - CBC no Diff - simvastatin (ZOCOR) 40 MG tablet; Take 1 tablet (40 mg total) by  mouth daily.  Dispense: 90 tablet; Refill: 1  5. Prediabetes  Chronic, good control  No current medications  Encouraged to limit intake of sugary foods and drinks  Encouraged to increase physical activity to 150 minutes per week - Hemoglobin A1c  6. Memory loss  She is complaining of having difficulty with mental clarity  - Vitamin B12  7. Encounter for osteoporosis screening in asymptomatic postmenopausal patient Will check bone density  - DG Bone Density; Future    Arnette Felts, FNP

## 2019-01-23 NOTE — Patient Instructions (Signed)
[name] , Thank you for taking time to come for your Medicare Wellness Visit. I appreciate your ongoing commitment to your health goals. Please review the following plan we discussed and let me know if I can assist you in the future.   Screening recommendations/referrals: Colonoscopy: 10/2012 Mammogram: 12/2018 Bone Density: 05/2016 Recommended yearly ophthalmology/optometry visit for glaucoma screening and checkup Recommended yearly dental visit for hygiene and checkup  Vaccinations: Influenza vaccine: 07/2018 Pneumococcal vaccine: 11/2016 Tdap vaccine: 10/30/2012 Shingles vaccine: discussed    Advanced directives: Advance directive discussed with you today. I have provided a copy for you to complete at home and have notarized. Once this is complete please bring a copy in to our office so we can scan it into your chart.   Conditions/risks identified: Obesity  Next appointment: 01/30/2020 at 2:00   Preventive Care 68 Years and Older, Female Preventive care refers to lifestyle choices and visits with your health care provider that can promote health and wellness. What does preventive care include?  A yearly physical exam. This is also called an annual well check.  Dental exams once or twice a year.  Routine eye exams. Ask your health care provider how often you should have your eyes checked.  Personal lifestyle choices, including:  Daily care of your teeth and gums.  Regular physical activity.  Eating a healthy diet.  Avoiding tobacco and drug use.  Limiting alcohol use.  Practicing safe sex.  Taking low-dose aspirin every day.  Taking vitamin and mineral supplements as recommended by your health care provider. What happens during an annual well check? The services and screenings done by your health care provider during your annual well check will depend on your age, overall health, lifestyle risk factors, and family history of disease. Counseling  Your health care  provider may ask you questions about your:  Alcohol use.  Tobacco use.  Drug use.  Emotional well-being.  Home and relationship well-being.  Sexual activity.  Eating habits.  History of falls.  Memory and ability to understand (cognition).  Work and work Astronomer.  Reproductive health. Screening  You may have the following tests or measurements:  Height, weight, and BMI.  Blood pressure.  Lipid and cholesterol levels. These may be checked every 5 years, or more frequently if you are over 11 years old.  Skin check.  Lung cancer screening. You may have this screening every year starting at age 24 if you have a 30-pack-year history of smoking and currently smoke or have quit within the past 15 years.  Fecal occult blood test (FOBT) of the stool. You may have this test every year starting at age 68.  Flexible sigmoidoscopy or colonoscopy. You may have a sigmoidoscopy every 5 years or a colonoscopy every 10 years starting at age 68.  Hepatitis C blood test.  Hepatitis B blood test.  Sexually transmitted disease (STD) testing.  Diabetes screening. This is done by checking your blood sugar (glucose) after you have not eaten for a while (fasting). You may have this done every 1-3 years.  Bone density scan. This is done to screen for osteoporosis. You may have this done starting at age 43.  Mammogram. This may be done every 1-2 years. Talk to your health care provider about how often you should have regular mammograms. Talk with your health care provider about your test results, treatment options, and if necessary, the need for more tests. Vaccines  Your health care provider may recommend certain vaccines, such as:  Influenza vaccine. This is recommended every year.  Tetanus, diphtheria, and acellular pertussis (Tdap, Td) vaccine. You may need a Td booster every 10 years.  Zoster vaccine. You may need this after age 44.  Pneumococcal 13-valent conjugate (PCV13)  vaccine. One dose is recommended after age 64.  Pneumococcal polysaccharide (PPSV23) vaccine. One dose is recommended after age 59. Talk to your health care provider about which screenings and vaccines you need and how often you need them. This information is not intended to replace advice given to you by your health care provider. Make sure you discuss any questions you have with your health care provider. Document Released: 12/11/2015 Document Revised: 08/03/2016 Document Reviewed: 09/15/2015 Elsevier Interactive Patient Education  2017 [employer] Prevention in the Home Falls can cause injuries. They can happen to people of all ages. There are many things you can do to make your home safe and to help prevent falls. What can I do on the outside of my home?  Regularly fix the edges of walkways and driveways and fix any cracks.  Remove anything that might make you trip as you walk through a door, such as a raised step or threshold.  Trim any bushes or trees on the path to your home.  Use bright outdoor lighting.  Clear any walking paths of anything that might make someone trip, such as rocks or tools.  Regularly check to see if handrails are loose or broken. Make sure that both sides of any steps have handrails.  Any raised decks and porches should have guardrails on the edges.  Have any leaves, snow, or ice cleared regularly.  Use sand or salt on walking paths during winter.  Clean up any spills in your garage right away. This includes oil or grease spills. What can I do in the bathroom?  Use night lights.  Install grab bars by the toilet and in the tub and shower. Do not use towel bars as grab bars.  Use non-skid mats or decals in the tub or shower.  If you need to sit down in the shower, use a plastic, non-slip stool.  Keep the floor dry. Clean up any water that spills on the floor as soon as it happens.  Remove soap buildup in the tub or shower  regularly.  Attach bath mats securely with double-sided non-slip rug tape.  Do not have throw rugs and other things on the floor that can make you trip. What can I do in the bedroom?  Use night lights.  Make sure that you have a light by your bed that is easy to reach.  Do not use any sheets or blankets that are too big for your bed. They should not hang down onto the floor.  Have a firm chair that has side arms. You can use this for support while you get dressed.  Do not have throw rugs and other things on the floor that can make you trip. What can I do in the kitchen?  Clean up any spills right away.  Avoid walking on wet floors.  Keep items that you use a lot in easy-to-reach places.  If you need to reach something above you, use a strong step stool that has a grab bar.  Keep electrical cords out of the way.  Do not use floor polish or wax that makes floors slippery. If you must use wax, use non-skid floor wax.  Do not have throw rugs and other things on the floor that  can make you trip. What can I do with my stairs?  Do not leave any items on the stairs.  Make sure that there are handrails on both sides of the stairs and use them. Fix handrails that are broken or loose. Make sure that handrails are as long as the stairways.  Check any carpeting to make sure that it is firmly attached to the stairs. Fix any carpet that is loose or worn.  Avoid having throw rugs at the top or bottom of the stairs. If you do have throw rugs, attach them to the floor with carpet tape.  Make sure that you have a light switch at the top of the stairs and the bottom of the stairs. If you do not have them, ask someone to add them for you. What else can I do to help prevent falls?  Wear shoes that:  Do not have high heels.  Have rubber bottoms.  Are comfortable and fit you well.  Are closed at the toe. Do not wear sandals.  If you use a stepladder:  Make sure that it is fully  opened. Do not climb a closed stepladder.  Make sure that both sides of the stepladder are locked into place.  Ask someone to hold it for you, if possible.  Clearly mark and make sure that you can see:  Any grab bars or handrails.  First and last steps.  Where the edge of each step is.  Use tools that help you move around (mobility aids) if they are needed. These include:  Canes.  Walkers.  Scooters.  Crutches.  Turn on the lights when you go into a dark area. Replace any light bulbs as soon as they burn out.  Set up your furniture so you have a clear path. Avoid moving your furniture around.  If any of your floors are uneven, fix them.  If there are any pets around you, be aware of where they are.  Review your medicines with your doctor. Some medicines can make you feel dizzy. This can increase your chance of falling. Ask your doctor what other things that you can do to help prevent falls. This information is not intended to replace advice given to you by your health care provider. Make sure you discuss any questions you have with your health care provider. Document Released: 09/10/2009 Document Revised: 04/21/2016 Document Reviewed: 12/19/2014 Elsevier Interactive Patient Education  2017 [employer].

## 2019-01-23 NOTE — Progress Notes (Signed)
Subjective:   [name] is a 68 y.o. female who presents for Medicare Annual (Subsequent) preventive examination.  Review of Systems:  n/a Cardiac Risk Factors include: advanced age (>56men, >63 women);hypertension;sedentary lifestyle;obesity (BMI >30kg/m2)     Objective:     Vitals: BP 122/72 (Patient Position: Sitting)   Pulse 79   Temp 98.3 F (36.8 C)   Ht 5' 4.4" (1.636 m)   Wt 201 lb (91.2 kg)   BMI 34.07 kg/m   Body mass index is 34.07 kg/m.  Advanced Directives 01/23/2019 02/08/2012 02/08/2012 01/31/2012  Does Patient Have a Medical Advance Directive? No Patient does not have advance directive;Patient would not like information Patient does not have advance directive;Patient would not like information -  Would patient like information on creating a medical advance directive? Yes (MAU/Ambulatory/Procedural Areas - Information given) - - -  Pre-existing out of facility DNR order (yellow form or pink MOST form) - No - No    Tobacco Social History   Tobacco Use  Smoking Status Never Smoker  Smokeless Tobacco Never Used     Counseling given: Not Answered   Clinical Intake:  Pre-visit preparation completed: Yes  Pain : No/denies pain Pain Score: 0-No pain     Nutritional Status: BMI > 30  Obese Nutritional Risks: None Diabetes: No  How often do you need to have someone help you when you read instructions, pamphlets, or other written materials from your doctor or pharmacy?: 1 - Never What is the last grade level you completed in school?: 12th grade        Past Medical History:  Diagnosis Date  . Arthritis   . Constipation   . GERD (gastroesophageal reflux disease)   . Hypertension    Past Surgical History:  Procedure Laterality Date  . ABDOMINAL HYSTERECTOMY    . BREAST SURGERY     bio  neg cancer  . EYE SURGERY  2019   bilateral cataract surgery   Family History  Problem Relation Age of Onset  . Diabetes Mother   . Heart attack Father     . Cancer Brother   . Anesthesia problems Neg Hx    Social History   Socioeconomic History  . Marital status: Married    Spouse name: Not on file  . Number of children: Not on file  . Years of education: Not on file  . Highest education level: Not on file  Occupational History  . Occupation: retired  Engineer, production  . Financial resource strain: Not hard at all  . Food insecurity:    Worry: Never true    Inability: Never true  . Transportation needs:    Medical: No    Non-medical: No  Tobacco Use  . Smoking status: Never Smoker  . Smokeless tobacco: Never Used  Substance and Sexual Activity  . Alcohol use: No  . Drug use: No  . Sexual activity: Not Currently  Lifestyle  . Physical activity:    Days per week: 0 days    Minutes per session: 0 min  . Stress: Not at all  Relationships  . Social connections:    Talks on phone: Not on file    Gets together: Not on file    Attends religious service: Not on file    Active member of club or organization: Not on file    Attends meetings of clubs or organizations: Not on file    Relationship status: Not on file  Other Topics Concern  .  Not on file  Social History Narrative  . Not on file    Outpatient Encounter Medications as of 01/23/2019  Medication Sig  . alendronate (FOSAMAX) 70 MG tablet PLEASE SEE ATTACHED FOR DETAILED DIRECTIONS  . aspirin 81 MG tablet Take 81 mg by mouth daily.  Marland Kitchen dexlansoprazole (DEXILANT) 60 MG capsule Take 60 mg by mouth daily.  . [DISCONTINUED] lisinopril-hydrochlorothiazide (PRINZIDE,ZESTORETIC) 20-12.5 MG tablet TAKE 1 TABLET BY MOUTH EVERY DAY  . [DISCONTINUED] simvastatin (ZOCOR) 40 MG tablet TAKE 1 TABLET BY MOUTH EVERY DAY   No facility-administered encounter medications on file as of 01/23/2019.     Activities of Daily Living In your present state of health, do you have any difficulty performing the following activities: 01/23/2019  Hearing? N  Vision? N  Difficulty concentrating or  making decisions? N  Walking or climbing stairs? N  Dressing or bathing? N  Doing errands, shopping? N  Preparing Food and eating ? N  Using the Toilet? N  In the past six months, have you accidently leaked urine? N  Do you have problems with loss of bowel control? N  Managing your Medications? N  Managing your Finances? N  Housekeeping or managing your Housekeeping? N  Some recent data might be hidden    Patient Care Team: Arnette Felts, FNP as PCP - General (General Practice) Norva Pavlov, OD (Optometry)    Assessment:   This is a routine wellness examination for [place].  Exercise Activities and Dietary recommendations Current Exercise Habits: The patient does not participate in regular exercise at present, Exercise limited by: None identified  Goals    . Weight (lb) < 200 lb (90.7 kg) (pt-stated)     Wants to lose 5 pounds       Fall Risk Fall Risk  01/23/2019 01/23/2019 10/15/2018  Falls in the past year? 0 0 0  Number falls in past yr: - 0 -  Injury with Fall? - 0 -   Is the patient's home free of loose throw rugs in walkways, pet beds, electrical cords, etc?   yes      Grab bars in the bathroom? no      Handrails on the stairs?   n/a      Adequate lighting?   yes  Timed Get Up and Go performed: n/a  Depression Screen PHQ 2/9 Scores 01/23/2019 01/23/2019 10/15/2018  PHQ - 2 Score 0 0 0  PHQ- 9 Score 1 - -     Cognitive Function     6CIT Screen 01/23/2019  What Year? 0 points  What month? 0 points  What time? 0 points  Count back from 20 0 points  Months in reverse 0 points  Repeat phrase 0 points  Total Score 0    Immunization History  Administered Date(s) Administered  . Influenza, High Dose Seasonal PF 08/18/2018    Qualifies for Shingles Vaccine? yes  Screening Tests Health Maintenance  Topic Date Due  . PNA vac Low Risk Adult (1 of 2 - PCV13) 12/12/2015  . MAMMOGRAM  01/04/2021  . COLONOSCOPY  01/19/2022  . TETANUS/TDAP  10/30/2022  .  INFLUENZA VACCINE  Completed  . DEXA SCAN  Completed  . Hepatitis C Screening  Completed    Cancer Screenings: Lung: Low Dose CT Chest recommended if Age 24-80 years, 30 pack-year currently smoking OR have quit w/in 15years. Patient does not qualify. Breast:  Up to date on Mammogram? Yes   Up to date of Bone Density/Dexa? Yes Colorectal: up  to date  Additional Screenings: Hepatitis C Screening: 01/23/2019 ordered      Plan:    Patient wants to lose 5 pounds. Prevnar 13 sent to the    I have personally reviewed and noted the following in the patient's chart:   . Medical and social history . Use of alcohol, tobacco or illicit drugs  . Current medications and supplements . Functional ability and status . Nutritional status . Physical activity . Advanced directives . List of other physicians . Hospitalizations, surgeries, and ER visits in previous 12 months . Vitals . Screenings to include cognitive, depression, and falls . Referrals and appointments  In addition, I have reviewed and discussed with patient certain preventive protocols, quality metrics, and best practice recommendations. A written personalized care plan for preventive services as well as general preventive health recommendations were provided to patient.     Barb Merino, LPN  1/61/0960

## 2019-01-24 LAB — CBC
Hematocrit: 37.4 % (ref 34.0–46.6)
Hemoglobin: 11.7 g/dL (ref 11.1–15.9)
MCH: 29.6 pg (ref 26.6–33.0)
MCHC: 31.3 g/dL — ABNORMAL LOW (ref 31.5–35.7)
MCV: 95 fL (ref 79–97)
Platelets: 357 10*3/uL (ref 150–450)
RBC: 3.95 x10E6/uL (ref 3.77–5.28)
RDW: 14.1 % (ref 11.7–15.4)
WBC: 5.3 10*3/uL (ref 3.4–10.8)

## 2019-01-24 LAB — CMP14 + ANION GAP
ALT: 12 IU/L (ref 0–32)
AST: 27 IU/L (ref 0–40)
Albumin/Globulin Ratio: 2.2 (ref 1.2–2.2)
Albumin: 4.6 g/dL (ref 3.8–4.8)
Alkaline Phosphatase: 70 IU/L (ref 39–117)
Anion Gap: 17 mmol/L (ref 10.0–18.0)
BUN/Creatinine Ratio: 16 (ref 12–28)
BUN: 16 mg/dL (ref 8–27)
Bilirubin Total: 0.3 mg/dL (ref 0.0–1.2)
CO2: 22 mmol/L (ref 20–29)
Calcium: 9.7 mg/dL (ref 8.7–10.3)
Chloride: 102 mmol/L (ref 96–106)
Creatinine, Ser: 1.01 mg/dL — ABNORMAL HIGH (ref 0.57–1.00)
GFR calc Af Amer: 66 mL/min/{1.73_m2} (ref >59)
GFR calc non Af Amer: 57 mL/min/{1.73_m2} — ABNORMAL LOW (ref >59)
Globulin, Total: 2.1 g/dL (ref 1.5–4.5)
Glucose: 79 mg/dL (ref 65–99)
Potassium: 4.2 mmol/L (ref 3.5–5.2)
Sodium: 141 mmol/L (ref 134–144)
Total Protein: 6.7 g/dL (ref 6.0–8.5)

## 2019-01-24 LAB — HEMOGLOBIN A1C
Est. average glucose Bld gHb Est-mCnc: 117 mg/dL
Hgb A1c MFr Bld: 5.7 % — ABNORMAL HIGH (ref 4.8–5.6)

## 2019-01-24 LAB — HEPATITIS C ANTIBODY: Hep C Virus Ab: <0.1 s/co ratio (ref 0.0–0.9)

## 2019-01-24 LAB — VITAMIN B12: Vitamin B-12: 539 pg/mL (ref 232–1245)

## 2019-02-04 ENCOUNTER — Other Ambulatory Visit: Payer: Self-pay | Admitting: Nurse Practitioner

## 2019-02-13 ENCOUNTER — Encounter: Payer: Self-pay | Admitting: Nurse Practitioner

## 2019-03-06 ENCOUNTER — Other Ambulatory Visit: Payer: Self-pay | Admitting: Nurse Practitioner

## 2019-03-06 NOTE — Telephone Encounter (Signed)
Benzonatate

## 2019-03-07 ENCOUNTER — Encounter: Payer: Self-pay | Admitting: Nurse Practitioner

## 2019-03-07 ENCOUNTER — Other Ambulatory Visit: Payer: Self-pay

## 2019-03-07 ENCOUNTER — Ambulatory Visit: Payer: Medicare Other | Admitting: Nurse Practitioner

## 2019-03-07 VITALS — Temp 97.4°F | Wt 196.8 lb

## 2019-03-07 DIAGNOSIS — R05 Cough: Secondary | ICD-10-CM | POA: Diagnosis not present

## 2019-03-07 DIAGNOSIS — J029 Acute pharyngitis, unspecified: Secondary | ICD-10-CM | POA: Diagnosis not present

## 2019-03-07 DIAGNOSIS — R059 Cough, unspecified: Secondary | ICD-10-CM

## 2019-03-07 NOTE — Progress Notes (Signed)
This visit type was conducted due to national recommendations for restrictions regarding the COVID-19 Pandemic (e.g. social distancing).  This format is felt to be most appropriate for this patient at this time.  All issues noted in this document were discussed and addressed.  No physical exam was performed (except for noted visual exam findings with Video Visits).  Please refer to the patient's chart (MyChart message for video visits and phone note for telephone visits) for the patient's consent to telehealth [facility].   Subjective:     Patient ID: [name] , female    DOB: 1951/05/26 , 68 y.o.   MRN: [id number]  Virtual Visit via Telephone Note  I connected with [name] on 03/07/19 at  9:45 AM EDT by telephone and verified that I am speaking with the correct person using two identifiers.   I discussed the limitations, risks, security and privacy concerns of performing an evaluation and management service by telephone and the availability of in person appointments. I also discussed with the patient that there may be a patient responsible charge related to this service. The patient expressed understanding and agreed to proceed.  Patient Location:  Home  Provider Location:  Office Chief Complaint  Patient presents with  . Cough    History of Present Illness:   She had a cold the first week of March.  The cough is lingering.  No fever.  Due to the cough she has a sore throat gargles with warm salt water.    She has not taken any medications for allergies. She will usually take claritin or generic antihistamine.   Cough  The cough is non-productive. Pertinent negatives include no chest pain, chills, ear congestion, fever or shortness of breath. Exacerbated by: worse at night. Treatments tried: tessalon perles. There is no history of asthma or pneumonia.     Past Medical History:  Diagnosis Date  . Arthritis   . Constipation   . GERD (gastroesophageal reflux disease)   .  Hypertension      Family History  Problem Relation Age of Onset  . Diabetes Mother   . Heart attack Father   . Cancer Brother   . Anesthesia problems Neg Hx      Current Outpatient Medications:  .  alendronate (FOSAMAX) 70 MG tablet, PLEASE SEE ATTACHED FOR DETAILED DIRECTIONS, Disp: , Rfl: 2 .  aspirin 81 MG tablet, Take 81 mg by mouth daily., Disp: , Rfl:  .  dexlansoprazole (DEXILANT) 60 MG capsule, Take 60 mg by mouth daily., Disp: , Rfl:  .  lisinopril-hydrochlorothiazide (PRINZIDE,ZESTORETIC) 20-12.5 MG tablet, Take 1 tablet by mouth daily., Disp: 90 tablet, Rfl: 1 .  simvastatin (ZOCOR) 40 MG tablet, Take 1 tablet (40 mg total) by mouth daily., Disp: 90 tablet, Rfl: 1 .  benzonatate (TESSALON) 100 MG capsule, TAKE 1 CAPSULE BY MOUTH THREE TIMES A DAY AS NEEDED FOR COUGH (Patient not taking: Reported on 03/07/2019), Disp: 30 capsule, Rfl: 0   Allergies  Allergen Reactions  . Penicillins Hives     Review of Systems  Constitutional: Negative for chills and fever.  Respiratory: Positive for cough. Negative for shortness of breath.   Cardiovascular: Negative for chest pain.    Today's Vitals   03/07/19 1012  Temp: (!) 97.4 F (36.3 C)  TempSrc: Oral  Weight: 196 lb 12.8 oz (89.3 kg)    Observations/Objective: No coughing during virtual visit.       Assessment and Plan: 1. Cough  Reoccurring  hacking cough reported   Will send Rx for Tessalon perles  At this time she is advised to avoid being around others  2. Sore throat  Continue with warm salt water gargles.    Follow Up Instructions:  If not better return call to office  I discussed the assessment and treatment plan with the patient. The patient was provided an opportunity to ask questions and all were answered. The patient agreed with the plan and demonstrated an understanding of the instructions.   The patient was advised to call back or seek an in-person evaluation if the symptoms worsen or if the  condition fails to improve as anticipated.  COVID-19 Education: The signs and symptoms of COVID-19 were discussed with the patient and how to seek care for testing (follow up with PCP or arrange E-visit).  The importance of social distancing was discussed today.   Patient Risk:   After full review of this patients clinical status, I feel that they are at least moderate risk at this time.  Spent 11 minutes with non face to face visit  Arnette Felts, FNP

## 2019-04-30 ENCOUNTER — Other Ambulatory Visit: Payer: Self-pay | Admitting: Nurse Practitioner

## 2019-07-24 ENCOUNTER — Other Ambulatory Visit: Payer: Self-pay

## 2019-07-24 ENCOUNTER — Ambulatory Visit (INDEPENDENT_AMBULATORY_CARE_PROVIDER_SITE_OTHER): Payer: Medicare Other | Admitting: Nurse Practitioner

## 2019-07-24 ENCOUNTER — Encounter: Payer: Self-pay | Admitting: Nurse Practitioner

## 2019-07-24 VITALS — BP 120/72 | HR 85 | Temp 98.6°F | Ht 64.4 in | Wt 204.8 lb

## 2019-07-24 DIAGNOSIS — R609 Edema, unspecified: Secondary | ICD-10-CM

## 2019-07-24 DIAGNOSIS — I10 Essential (primary) hypertension: Secondary | ICD-10-CM | POA: Diagnosis not present

## 2019-07-24 DIAGNOSIS — R600 Localized edema: Secondary | ICD-10-CM

## 2019-07-24 DIAGNOSIS — R252 Cramp and spasm: Secondary | ICD-10-CM | POA: Diagnosis not present

## 2019-07-24 DIAGNOSIS — Z23 Encounter for immunization: Secondary | ICD-10-CM | POA: Diagnosis not present

## 2019-07-24 DIAGNOSIS — R7303 Prediabetes: Secondary | ICD-10-CM | POA: Diagnosis not present

## 2019-07-24 DIAGNOSIS — E782 Mixed hyperlipidemia: Secondary | ICD-10-CM | POA: Diagnosis not present

## 2019-07-24 DIAGNOSIS — G4700 Insomnia, unspecified: Secondary | ICD-10-CM

## 2019-07-24 NOTE — Progress Notes (Signed)
Subjective:     Patient ID: [name] , female    DOB: 02/01/1951 , 68 y.o.   MRN: [id number]   Chief Complaint  Patient presents with  . Hypertension    HPI  Hypertension This is a chronic problem. The current episode started more than 1 year ago. The problem is unchanged. The problem is controlled. Pertinent negatives include no anxiety, chest pain, headaches, malaise/fatigue or palpitations. There are no associated agents to hypertension. Risk factors for coronary artery disease include sedentary lifestyle, obesity and dyslipidemia. Past treatments include ACE inhibitors and diuretics. There are no compliance problems.  There is no history of angina or kidney disease. There is no history of chronic renal disease.  Insomnia Primary symptoms: no sleep disturbance, no malaise/fatigue, no napping.  The current episode started more than one month. The onset quality is gradual. The problem occurs intermittently. The treatment provided no relief. PMH includes: associated symptoms present.     Past Medical History:  Diagnosis Date  . Arthritis   . Constipation   . GERD (gastroesophageal reflux disease)   . Hypertension      Family History  Problem Relation Age of Onset  . Diabetes Mother   . Heart attack Father   . Cancer Brother   . Anesthesia problems Neg Hx      Current Outpatient Medications:  .  alendronate (FOSAMAX) 70 MG tablet, TAKE 1 TABLET BY ORAL ROUTE EVERY WEEK IN THE MORNING, AT LEAST 30 MIN BEFORE FIRST FOOD, BEVERAGE, OR MEDICATION OF DAY, Disp: 12 tablet, Rfl: 2 .  aspirin 81 MG tablet, Take 81 mg by mouth daily., Disp: , Rfl:  .  dexlansoprazole (DEXILANT) 60 MG capsule, Take 60 mg by mouth daily., Disp: , Rfl:  .  lisinopril-hydrochlorothiazide (PRINZIDE,ZESTORETIC) 20-12.5 MG tablet, Take 1 tablet by mouth daily., Disp: 90 tablet, Rfl: 1 .  simvastatin (ZOCOR) 40 MG tablet, Take 1 tablet (40 mg total) by mouth daily., Disp: 90 tablet, Rfl: 1 .  benzonatate  (TESSALON) 100 MG capsule, TAKE 1 CAPSULE BY MOUTH 3 TIMES A DAY AS NEEDED FOR COUGH (Patient not taking: Reported on 07/24/2019), Disp: 30 capsule, Rfl: 0   Allergies  Allergen Reactions  . Penicillins Hives     Review of Systems  Constitutional: Negative.  Negative for malaise/fatigue.  Respiratory: Negative.   Cardiovascular: Negative.  Negative for chest pain, palpitations and leg swelling.  Neurological: Negative.  Negative for dizziness and headaches.  Psychiatric/Behavioral: Negative for sleep disturbance. The patient has insomnia.      Today's Vitals   07/24/19 1413  BP: 120/72  Pulse: 85  Temp: 98.6 F (37 C)  TempSrc: Oral  Weight: 204 lb 12.8 oz (92.9 kg)  Height: 5' 4.4" (1.636 m)  PainSc: 4   PainLoc: Back   Body mass index is 34.72 kg/m.   Objective:  Physical Exam Constitutional:      Appearance: Normal appearance.  Cardiovascular:     Rate and Rhythm: Normal rate and regular rhythm.     Pulses: Normal pulses.     Heart sounds: Normal heart sounds. No murmur.  Pulmonary:     Effort: Pulmonary effort is normal.     Breath sounds: Normal breath sounds.  Musculoskeletal:     Right lower leg: Edema (trace) present.     Left lower leg: Edema (trace) present.  Skin:    General: Skin is warm and dry.     Capillary Refill: Capillary refill takes less than 2 seconds.  Neurological:     General: No focal deficit present.     Mental Status: She is alert and oriented to person, place, and time.  Psychiatric:        Mood and Affect: Mood normal.        Behavior: Behavior normal.        Thought Content: Thought content normal.        Judgment: Judgment normal.         Assessment And Plan:     1. Essential hypertension . B/P is well controlled.  . CMP ordered to check renal function.  . The importance of regular exercise and dietary modification to include limiting her intake of high salt foods was stressed to the patient.  2. Mixed  hyperlipidemia  Will check lipid panel and advised to take her simvastatin every other day to see if this helps her muscle leg cramps - Lipid panel  3. Prediabetes  Chronic, stable  No current medications  Encouraged to increase physical activity  4. Encounter for immunization  Influenza given in office - Flu vaccine HIGH DOSE PF (Fluzone High dose)  5. Leg cramps  Decreased water intake vs medication (simvastatin)  Will decrease dose of cholesterol medication and advised to drink 4 oz tonic water per day and magnesium with evening meal  6. Insomnia, unspecified type  She will try melatonin again with the magnesium   If ineffective will consider trazadone  She is under increased stress related to her husband being treated for cancer  7. Edema, unspecified type  Trace edema bilaterally  Wear support socks  Keep elevated when possible  If worsens return call to office or schedule an appt   [name], FNP    THE PATIENT IS ENCOURAGED TO PRACTICE SOCIAL DISTANCING DUE TO THE COVID-19 PANDEMIC.

## 2019-07-24 NOTE — Patient Instructions (Addendum)
Take simvastatin every other day to see if helps with decreasing muscle cramps  Increase water intake of water and can drink 4 oz tonic water before bed  Also take 250 mg of magnesium per day with evening meal  Take melatonin as needed for sleep 1-2 mg per night to start   Insomnia Insomnia is a sleep disorder that makes it difficult to fall asleep or stay asleep. Insomnia can cause fatigue, low energy, difficulty concentrating, mood swings, and poor performance at work or school. There are three different ways to classify insomnia:  Difficulty falling asleep.  Difficulty staying asleep.  Waking up too early in the morning. Any type of insomnia can be long-term (chronic) or short-term (acute). Both are common. Short-term insomnia usually lasts for three months or less. Chronic insomnia occurs at least three times a week for longer than three months. What are the causes? Insomnia may be caused by another condition, situation, or substance, such as:  Anxiety.  Certain medicines.  Gastroesophageal reflux disease (GERD) or other gastrointestinal conditions.  Asthma or other breathing conditions.  Restless legs syndrome, sleep apnea, or other sleep disorders.  Chronic pain.  Menopause.  Stroke.  Abuse of alcohol, tobacco, or illegal drugs.  Mental health conditions, such as depression.  Caffeine.  Neurological disorders, such as Alzheimer's disease.  An overactive thyroid (hyperthyroidism). Sometimes, the cause of insomnia may not be known. What increases the risk? Risk factors for insomnia include:  Gender. Women are affected more often than men.  Age. Insomnia is more common as you get older.  Stress.  Lack of exercise.  Irregular work schedule or working night shifts.  Traveling between different time zones.  Certain medical and mental health conditions. What are the signs or symptoms? If you have insomnia, the main symptom is having trouble falling  asleep or having trouble staying asleep. This may lead to other symptoms, such as:  Feeling fatigued or having low energy.  Feeling nervous about going to sleep.  Not feeling rested in the morning.  Having trouble concentrating.  Feeling irritable, anxious, or depressed. How is this diagnosed? This condition may be diagnosed based on:  Your symptoms and medical history. Your health care provider may ask about: ? Your sleep habits. ? Any medical conditions you have. ? Your mental health.  A physical exam. How is this treated? Treatment for insomnia depends on the cause. Treatment may focus on treating an underlying condition that is causing insomnia. Treatment may also include:  Medicines to help you sleep.  Counseling or therapy.  Lifestyle adjustments to help you sleep better. Follow these instructions at home: Eating and drinking   Limit or avoid alcohol, caffeinated beverages, and cigarettes, especially close to bedtime. These can disrupt your sleep.  Do not eat a large meal or eat spicy foods right before bedtime. This can lead to digestive discomfort that can make it hard for you to sleep. Sleep habits   Keep a sleep diary to help you and your health care provider figure out what could be causing your insomnia. Write down: ? When you sleep. ? When you wake up during the night. ? How well you sleep. ? How rested you feel the next day. ? Any side effects of medicines you are taking. ? What you eat and drink.  Make your bedroom a dark, comfortable place where it is easy to fall asleep. ? Put up shades or blackout curtains to block light from outside. ? Use a white noise  machine to block noise. ? Keep the temperature cool.  Limit screen use before bedtime. This includes: ? Watching TV. ? Using your smartphone, tablet, or computer.  Stick to a routine that includes going to bed and waking up at the same times every day and night. This can help you fall asleep  faster. Consider making a quiet activity, such as reading, part of your nighttime routine.  Try to avoid taking naps during the day so that you sleep better at night.  Get out of bed if you are still awake after 15 minutes of trying to sleep. Keep the lights down, but try reading or doing a quiet activity. When you feel sleepy, go back to bed. General instructions  Take over-the-counter and prescription medicines only as told by your health care provider.  Exercise regularly, as told by your health care provider. Avoid exercise starting several hours before bedtime.  Use relaxation techniques to manage stress. Ask your health care provider to suggest some techniques that may work well for you. These may include: ? Breathing exercises. ? Routines to release muscle tension. ? Visualizing peaceful scenes.  Make sure that you drive carefully. Avoid driving if you feel very sleepy.  Keep all follow-up visits as told by your health care provider. This is important. Contact a health care provider if:  You are tired throughout the day.  You have trouble in your daily routine due to sleepiness.  You continue to have sleep problems, or your sleep problems get worse. Get help right away if:  You have serious thoughts about hurting yourself or someone else. If you ever feel like you may hurt yourself or others, or have thoughts about taking your own life, get help right away. You can go to your nearest emergency department or call:  Your local emergency services (911 in the [place]  A suicide crisis helpline, such as the National Suicide Prevention Lifeline at 910-849-5027. This is open 24 hours a day. Summary  Insomnia is a sleep disorder that makes it difficult to fall asleep or stay asleep.  Insomnia can be long-term (chronic) or short-term (acute).  Treatment for insomnia depends on the cause. Treatment may focus on treating an underlying condition that is causing insomnia.  Keep a  sleep diary to help you and your health care provider figure out what could be causing your insomnia. This information is not intended to replace advice given to you by your health care provider. Make sure you discuss any questions you have with your health care provider. Document Released: 11/11/2000 Document Revised: 10/27/2017 Document Reviewed: 08/24/2017 Elsevier Patient Education  2020 Elsevier Inc.  Influenza Virus Vaccine (Flucelvax) What is this medicine? INFLUENZA VIRUS VACCINE (in floo EN zuh VAHY ruhs vak SEEN) helps to reduce the risk of getting influenza also known as the flu. The vaccine only helps protect you against some strains of the flu. This medicine may be used for other purposes; ask your health care provider or pharmacist if you have questions. COMMON BRAND NAME(S): FLUCELVAX What should I tell my health care provider before I take this medicine? They need to know if you have any of these conditions:  bleeding disorder like hemophilia  fever or infection  Guillain-Barre syndrome or other neurological problems  immune system problems  infection with the human immunodeficiency virus (HIV) or AIDS  low blood platelet counts  multiple sclerosis  an unusual or allergic reaction to influenza virus vaccine, other medicines, foods, dyes or preservatives  pregnant or  trying to get pregnant  breast-feeding How should I use this medicine? This vaccine is for injection into a muscle. It is given by a health care professional. A copy of Vaccine Information Statements will be given before each vaccination. Read this sheet carefully each time. The sheet may change frequently. Talk to your pediatrician regarding the use of this medicine in children. Special care may be needed. Overdosage: If you think you've taken too much of this medicine contact a poison control center or emergency room at once. Overdosage: If you think you have taken too much of this medicine contact  a poison control center or emergency room at once. NOTE: This medicine is only for you. Do not share this medicine with others. What if I miss a dose? This does not apply. What may interact with this medicine?  chemotherapy or radiation therapy  medicines that lower your immune system like etanercept, anakinra, infliximab, and adalimumab  medicines that treat or prevent blood clots like warfarin  phenytoin  steroid medicines like prednisone or cortisone  theophylline  vaccines This list may not describe all possible interactions. Give your health care provider a list of all the medicines, herbs, non-prescription drugs, or dietary supplements you use. Also tell them if you smoke, drink alcohol, or use illegal drugs. Some items may interact with your medicine. What should I watch for while using this medicine? Report any side effects that do not go away within 3 days to your doctor or health care professional. Call your health care provider if any unusual symptoms occur within 6 weeks of receiving this vaccine. You may still catch the flu, but the illness is not usually as bad. You cannot get the flu from the vaccine. The vaccine will not protect against colds or other illnesses that may cause fever. The vaccine is needed every year. What side effects may I notice from receiving this medicine? Side effects that you should report to your doctor or health care professional as soon as possible:  allergic reactions like skin rash, itching or hives, swelling of the face, lips, or tongue Side effects that usually do not require medical attention (Report these to your doctor or health care professional if they continue or are bothersome.):  fever  headache  muscle aches and pains  pain, tenderness, redness, or swelling at the injection site  tiredness This list may not describe all possible side effects. Call your doctor for medical advice about side effects. You may report side effects  to FDA at 1-800-FDA-1088. Where should I keep my medicine? The vaccine will be given by a health care professional in a clinic, pharmacy, doctor's office, or other health care setting. You will not be given vaccine doses to store at home. NOTE: This sheet is a summary. It may not cover all possible information. If you have questions about this medicine, talk to your doctor, pharmacist, or health care provider.  2020 Elsevier/Gold Standard (2011-10-26 14:06:47)

## 2019-07-25 LAB — LIPID PANEL
Chol/HDL Ratio: 3.2 ratio (ref 0.0–4.4)
Cholesterol, Total: 162 mg/dL (ref 100–199)
HDL: 51 mg/dL (ref >39)
LDL Calculated: 82 mg/dL (ref 0–99)
Triglycerides: 145 mg/dL (ref 0–149)
VLDL Cholesterol Cal: 29 mg/dL (ref 5–40)

## 2019-07-25 LAB — HEMOGLOBIN A1C
Est. average glucose Bld gHb Est-mCnc: 114 mg/dL
Hgb A1c MFr Bld: 5.6 % (ref 4.8–5.6)

## 2019-07-25 LAB — CMP14 + ANION GAP
ALT: 17 IU/L (ref 0–32)
AST: 27 IU/L (ref 0–40)
Albumin/Globulin Ratio: 1.5 (ref 1.2–2.2)
Albumin: 4.1 g/dL (ref 3.8–4.8)
Alkaline Phosphatase: 72 IU/L (ref 39–117)
Anion Gap: 15 mmol/L (ref 10.0–18.0)
BUN/Creatinine Ratio: 14 (ref 12–28)
BUN: 16 mg/dL (ref 8–27)
Bilirubin Total: <0.2 mg/dL (ref 0.0–1.2)
CO2: 24 mmol/L (ref 20–29)
Calcium: 9.7 mg/dL (ref 8.7–10.3)
Chloride: 101 mmol/L (ref 96–106)
Creatinine, Ser: 1.17 mg/dL — ABNORMAL HIGH (ref 0.57–1.00)
GFR calc Af Amer: 55 mL/min/{1.73_m2} — ABNORMAL LOW (ref >59)
GFR calc non Af Amer: 48 mL/min/{1.73_m2} — ABNORMAL LOW (ref >59)
Globulin, Total: 2.8 g/dL (ref 1.5–4.5)
Glucose: 83 mg/dL (ref 65–99)
Potassium: 4.1 mmol/L (ref 3.5–5.2)
Sodium: 140 mmol/L (ref 134–144)
Total Protein: 6.9 g/dL (ref 6.0–8.5)

## 2019-08-31 ENCOUNTER — Other Ambulatory Visit: Payer: Self-pay | Admitting: Nurse Practitioner

## 2019-08-31 DIAGNOSIS — E782 Mixed hyperlipidemia: Secondary | ICD-10-CM

## 2019-08-31 DIAGNOSIS — I10 Essential (primary) hypertension: Secondary | ICD-10-CM

## 2019-10-21 ENCOUNTER — Ambulatory Visit: Payer: Medicare Other | Admitting: Nurse Practitioner

## 2020-01-03 ENCOUNTER — Other Ambulatory Visit: Payer: Self-pay | Admitting: Nurse Practitioner

## 2020-01-03 DIAGNOSIS — I10 Essential (primary) hypertension: Secondary | ICD-10-CM

## 2020-01-03 DIAGNOSIS — E782 Mixed hyperlipidemia: Secondary | ICD-10-CM

## 2020-01-09 LAB — HM MAMMOGRAPHY

## 2020-01-12 ENCOUNTER — Ambulatory Visit: Payer: Medicare PPO | Attending: Internal Medicine

## 2020-01-12 DIAGNOSIS — Z23 Encounter for immunization: Secondary | ICD-10-CM

## 2020-01-12 NOTE — Progress Notes (Signed)
   Covid-19 Vaccination Clinic  Name:  EMMALENA CANNY    MRN: [id number] DOB: Aug 15, 1951  01/12/2020  Ms. Pegg was observed post Covid-19 immunization for 15 minutes without incidence. She was provided with Vaccine Information Sheet and instruction to access the V-Safe system.   Ms. Oshiro was instructed to call 911 with any severe reactions post vaccine: Marland Kitchen Difficulty breathing  . Swelling of your face and throat  . A fast heartbeat  . A bad rash all over your body  . Dizziness and weakness    Immunizations Administered    Name Date Dose VIS Date Route   Pfizer COVID-19 Vaccine 01/12/2020  2:14 PM 0.3 mL 11/08/2019 Intramuscular   Manufacturer: [employer], [employer]   Lot: [id number]   NDC: [address]-8

## 2020-01-18 ENCOUNTER — Other Ambulatory Visit: Payer: Self-pay | Admitting: Nurse Practitioner

## 2020-01-30 ENCOUNTER — Encounter: Payer: Medicare Other | Admitting: Nurse Practitioner

## 2020-01-30 ENCOUNTER — Ambulatory Visit: Payer: Medicare Other

## 2020-02-04 ENCOUNTER — Ambulatory Visit: Payer: Medicare PPO | Attending: Internal Medicine

## 2020-02-04 DIAGNOSIS — Z23 Encounter for immunization: Secondary | ICD-10-CM

## 2020-02-04 NOTE — Progress Notes (Signed)
   Covid-19 Vaccination Clinic  Name:  [name]    MRN: [id number] DOB: 1950-12-21  02/04/2020  [name] was observed post Covid-19 immunization for 15 minutes without incident. She was provided with Vaccine Information Sheet and instruction to access the V-Safe system.   [name] was instructed to call 911 with any severe reactions post vaccine: Marland Kitchen Difficulty breathing  . Swelling of face and throat  . A fast heartbeat  . A bad rash all over body  . Dizziness and weakness   Immunizations Administered    Name Date Dose VIS Date Route   Pfizer COVID-19 Vaccine 02/04/2020  2:52 PM 0.3 mL 11/08/2019 Intramuscular   Manufacturer: [employer], [employer]   Lot: [id number]   NDC: [address]-5

## 2020-02-05 ENCOUNTER — Ambulatory Visit: Payer: Medicare PPO

## 2020-02-12 ENCOUNTER — Other Ambulatory Visit: Payer: Self-pay

## 2020-02-12 ENCOUNTER — Ambulatory Visit (INDEPENDENT_AMBULATORY_CARE_PROVIDER_SITE_OTHER): Payer: Medicare PPO | Admitting: Nurse Practitioner

## 2020-02-12 ENCOUNTER — Ambulatory Visit (INDEPENDENT_AMBULATORY_CARE_PROVIDER_SITE_OTHER): Payer: Medicare PPO

## 2020-02-12 ENCOUNTER — Encounter: Payer: Self-pay | Admitting: Nurse Practitioner

## 2020-02-12 VITALS — BP 118/80 | HR 78 | Temp 98.3°F | Ht 64.4 in | Wt 200.0 lb

## 2020-02-12 VITALS — BP 118/80 | HR 78 | Temp 98.3°F | Ht 64.4 in | Wt 200.8 lb

## 2020-02-12 DIAGNOSIS — E782 Mixed hyperlipidemia: Secondary | ICD-10-CM | POA: Diagnosis not present

## 2020-02-12 DIAGNOSIS — I10 Essential (primary) hypertension: Secondary | ICD-10-CM

## 2020-02-12 DIAGNOSIS — K921 Melena: Secondary | ICD-10-CM | POA: Diagnosis not present

## 2020-02-12 DIAGNOSIS — R7303 Prediabetes: Secondary | ICD-10-CM

## 2020-02-12 DIAGNOSIS — Z0000 Encounter for general adult medical examination without abnormal findings: Secondary | ICD-10-CM

## 2020-02-12 DIAGNOSIS — R195 Other fecal abnormalities: Secondary | ICD-10-CM

## 2020-02-12 DIAGNOSIS — R8290 Unspecified abnormal findings in urine: Secondary | ICD-10-CM

## 2020-02-12 DIAGNOSIS — K219 Gastro-esophageal reflux disease without esophagitis: Secondary | ICD-10-CM

## 2020-02-12 LAB — POCT URINALYSIS DIPSTICK
Bilirubin, UA: NEGATIVE
Glucose, UA: NEGATIVE
Ketones, UA: NEGATIVE
Nitrite, UA: NEGATIVE
Protein, UA: NEGATIVE
Spec Grav, UA: 1.015 (ref 1.010–1.025)
Urobilinogen, UA: 0.2 E.U./dL
pH, UA: 6.5 (ref 5.0–8.0)

## 2020-02-12 LAB — POCT UA - MICROALBUMIN
Albumin/Creatinine Ratio, Urine, POC: <30
Creatinine, POC: 50 mg/dL
Microalbumin Ur, POC: 10 mg/L

## 2020-02-12 NOTE — Progress Notes (Addendum)
This visit occurred during the SARS-CoV-2 public health emergency.  Safety protocols were in place, including screening questions prior to the visit, additional usage of staff PPE, and extensive cleaning of exam room while observing appropriate contact time as indicated for disinfecting solutions.  Subjective:     Patient ID: [name] , female    DOB: 25-Mar-1951 , 69 y.o.   MRN: [id number]   Chief Complaint  Patient presents with  . Annual Exam    HPI The patient states she uses status post hysterectomy for birth control.Negative for Dysmenorrhea and Negative for Menorrhagia Mammogram last done at [facility] 01/09/2020 -  Negative for: breast discharge, breast lump(s), breast pain and breast self exam.  Pertinent negatives include abnormal bleeding (hematology), anxiety, decreased libido, depression, difficulty falling sleep, dyspareunia, history of infertility, nocturia, sexual dysfunction, sleep disturbances, urinary incontinence, urinary urgency, vaginal discharge and vaginal itching. Diet regular. The patient states her exercise level is minimal.  She is retired.       The patient's tobacco use is:  Social History   Tobacco Use  Smoking Status Never Smoker  Smokeless Tobacco Never Used   She has been exposed to passive smoke. The patient's alcohol use is:  Social History   Substance and Sexual Activity  Alcohol Use No   Additional information: Hysterectomy   Here for HM  Wt Readings from Last 3 Encounters: 02/12/20 : 200 lb 12.8 oz (91.1 kg) 07/24/19 : 204 lb 12.8 oz (92.9 kg) 03/07/19 : 196 lb 12.8 oz (89.3 kg)  She had her second covid vaccine last week.     Past Medical History:  Diagnosis Date  . Arthritis   . Constipation   . GERD (gastroesophageal reflux disease)   . Hypertension      Family History  Problem Relation Age of Onset  . Diabetes Mother   . Heart attack Father   . Cancer Brother   . Anesthesia problems Neg Hx      Current Outpatient  Medications:  .  alendronate (FOSAMAX) 70 MG tablet, TAKE 1 TABLET BY MOUTH EVERY WEEK IN THE MORNING, AT LEAST 30 MIN BEFORE FIRST FOOD, BEVERAGE, OR MEDICATION OF DAY, Disp: 12 tablet, Rfl: 2 .  aspirin 81 MG tablet, Take 81 mg by mouth daily., Disp: , Rfl:  .  dexlansoprazole (DEXILANT) 60 MG capsule, Take 60 mg by mouth daily., Disp: , Rfl:  .  lisinopril-hydrochlorothiazide (ZESTORETIC) 20-12.5 MG tablet, TAKE 1 TABLET BY MOUTH EVERY DAY, Disp: 90 tablet, Rfl: 1 .  simvastatin (ZOCOR) 40 MG tablet, TAKE 1 TABLET BY MOUTH EVERY DAY, Disp: 90 tablet, Rfl: 1   Allergies  Allergen Reactions  . Penicillins Hives     Review of Systems  Constitutional: Negative.   HENT: Negative.   Eyes: Negative.   Respiratory: Negative.   Cardiovascular: Negative.  Negative for chest pain, palpitations and leg swelling.  Gastrointestinal: Negative.  Negative for diarrhea and nausea.       Black stool  Endocrine: Negative.   Genitourinary: Negative.   Musculoskeletal: Negative.   Skin: Negative.   Neurological: Negative.   Hematological: Negative.   Psychiatric/Behavioral: Negative.      Today's Vitals   02/12/20 1444  BP: 118/80  Pulse: 78  Temp: 98.3 F (36.8 C)  Weight: 200 lb 12.8 oz (91.1 kg)  Height: 5' 4.4" (1.636 m)  PainSc: 6   PainLoc: Back   Body mass index is 34.04 kg/m.   Objective:  Physical Exam Vitals reviewed.  Constitutional:      General: She is not in acute distress.    Appearance: Normal appearance. She is well-developed. She is obese.  HENT:     Head: Normocephalic and atraumatic.     Right Ear: Hearing, tympanic membrane, ear canal and external ear normal. There is no impacted cerumen.     Left Ear: Hearing, tympanic membrane, ear canal and external ear normal. There is no impacted cerumen.     Nose:     Comments: Deferred - mask    Mouth/Throat:     Comments: Deferred - mask Eyes:     General: Lids are normal.     Conjunctiva/sclera: Conjunctivae  normal.     Pupils: Pupils are equal, round, and reactive to light.     Funduscopic exam:    Right eye: No papilledema.        Left eye: No papilledema.  Neck:     Thyroid: No thyroid mass.     Vascular: No carotid bruit.  Cardiovascular:     Rate and Rhythm: Normal rate and regular rhythm.     Pulses: Normal pulses.     Heart sounds: Normal heart sounds. No murmur.  Pulmonary:     Effort: Pulmonary effort is normal. No respiratory distress.     Breath sounds: Normal breath sounds.  Chest:     Breasts:        Right: Normal. No mass or tenderness.        Left: Normal. No mass or tenderness.  Abdominal:     General: Abdomen is flat. Bowel sounds are normal. There is no distension.     Palpations: Abdomen is soft.  Musculoskeletal:        General: No swelling or tenderness. Normal range of motion.     Cervical back: Full passive range of motion without pain, normal range of motion and neck supple.     Right lower leg: No edema.     Left lower leg: No edema.  Lymphadenopathy:     Upper Body:     Right upper body: No supraclavicular or axillary adenopathy.     Left upper body: No supraclavicular or axillary adenopathy.  Skin:    General: Skin is warm and dry.     Capillary Refill: Capillary refill takes less than 2 seconds.  Neurological:     General: No focal deficit present.     Mental Status: She is alert and oriented to person, place, and time.     Cranial Nerves: No cranial nerve deficit.     Sensory: No sensory deficit.  Psychiatric:        Mood and Affect: Mood normal.        Behavior: Behavior normal.        Thought Content: Thought content normal.        Judgment: Judgment normal.         Assessment And Plan:     1. Health maintenance examination . Behavior modifications discussed and diet history reviewed.   . Pt will continue to exercise regularly and modify diet with low GI, plant based foods and decrease intake of processed foods.  . Recommend intake of  daily multivitamin, Vitamin D, and calcium.  . Recommend for preventive screenings, as well as recommend immunizations that include influenza, TDAP - CMP14+EGFR - CBC  2. Essential hypertension  Chronic  Great control  Continue with current medications  EKG done with NSR HR 77  3. Mixed hyperlipidemia  Chronic, controlled  Continue with current medications  No complaints of myalgias - Lipid panel  4. Prediabetes  Chronic, controlled  Continue with current medications  Encouraged to limit intake of sugary foods and drinks  Encouraged to increase physical activity to 150 minutes per week as tolerated especially since she is now retired - Hemoglobin A1c  5. Black stools  Given 3 hemocult cards to return to office from 3 different samples to evaluate for blood.  Pending results will have her to follow up with Dr. Collene Mares  6. Loose stools  Will check for bacteria   Encouraged to avoid high fiber diet and eat bland foods - Clostridium difficile Toxin A/B - Culture, Stool - Ova and parasite examination  7. Gastroesophageal reflux disease without esophagitis  Chronic, stable  Encouraged her to take a probiotic and avoid foods that increase risk for reflux, she is also on dexilant from GI   [name], [place] DISTANCING DUE TO THE COVID-19 PANDEMIC.

## 2020-02-12 NOTE — Patient Instructions (Signed)
[name] , Thank you for taking time to come for your Medicare Wellness Visit. I appreciate your ongoing commitment to your health goals. Please review the following plan we discussed and let me know if I can assist you in the future.   Screening recommendations/referrals: Colonoscopy: 12/2011 Mammogram: 12/2019 Bone Density: 05/2016 Recommended yearly ophthalmology/optometry visit for glaucoma screening and checkup Recommended yearly dental visit for hygiene and checkup  Vaccinations: Influenza vaccine: 06/2019 Pneumococcal vaccine: decline Tdap vaccine: 10/2012 Shingles vaccine: discussed    Advanced directives: Advance directive discussed with you today. Even though you declined this today please call our office should you change your mind and we can give you the proper paperwork for you to fill out.   Conditions/risks identified: obesity  Next appointment:    Preventive Care 65 Years and Older, Female Preventive care refers to lifestyle choices and visits with your health care provider that can promote health and wellness. What does preventive care include?  A yearly physical exam. This is also called an annual well check.  Dental exams once or twice a year.  Routine eye exams. Ask your health care provider how often you should have your eyes checked.  Personal lifestyle choices, including:  Daily care of your teeth and gums.  Regular physical activity.  Eating a healthy diet.  Avoiding tobacco and drug use.  Limiting alcohol use.  Practicing safe sex.  Taking low-dose aspirin every day.  Taking vitamin and mineral supplements as recommended by your health care provider. What happens during an annual well check? The services and screenings done by your health care provider during your annual well check will depend on your age, overall health, lifestyle risk factors, and family history of disease. Counseling  Your health care provider may ask you questions about  your:  Alcohol use.  Tobacco use.  Drug use.  Emotional well-being.  Home and relationship well-being.  Sexual activity.  Eating habits.  History of falls.  Memory and ability to understand (cognition).  Work and work Astronomer.  Reproductive health. Screening  You may have the following tests or measurements:  Height, weight, and BMI.  Blood pressure.  Lipid and cholesterol levels. These may be checked every 5 years, or more frequently if you are over 76 years old.  Skin check.  Lung cancer screening. You may have this screening every year starting at age 39 if you have a 30-pack-year history of smoking and currently smoke or have quit within the past 15 years.  Fecal occult blood test (FOBT) of the stool. You may have this test every year starting at age 72.  Flexible sigmoidoscopy or colonoscopy. You may have a sigmoidoscopy every 5 years or a colonoscopy every 10 years starting at age 58.  Hepatitis C blood test.  Hepatitis B blood test.  Sexually transmitted disease (STD) testing.  Diabetes screening. This is done by checking your blood sugar (glucose) after you have not eaten for a while (fasting). You may have this done every 1-3 years.  Bone density scan. This is done to screen for osteoporosis. You may have this done starting at age 25.  Mammogram. This may be done every 1-2 years. Talk to your health care provider about how often you should have regular mammograms. Talk with your health care provider about your test results, treatment options, and if necessary, the need for more tests. Vaccines  Your health care provider may recommend certain vaccines, such as:  Influenza vaccine. This is recommended every year.  Tetanus, diphtheria, and acellular pertussis (Tdap, Td) vaccine. You may need a Td booster every 10 years.  Zoster vaccine. You may need this after age 82.  Pneumococcal 13-valent conjugate (PCV13) vaccine. One dose is recommended  after age 74.  Pneumococcal polysaccharide (PPSV23) vaccine. One dose is recommended after age 59. Talk to your health care provider about which screenings and vaccines you need and how often you need them. This information is not intended to replace advice given to you by your health care provider. Make sure you discuss any questions you have with your health care provider. Document Released: 12/11/2015 Document Revised: 08/03/2016 Document Reviewed: 09/15/2015 Elsevier Interactive Patient Education  2017 [place] Prevention in the Home Falls can cause injuries. They can happen to people of all ages. There are many things you can do to make your home safe and to help prevent falls. What can I do on the outside of my home?  Regularly fix the edges of walkways and driveways and fix any cracks.  Remove anything that might make you trip as you walk through a door, such as a raised step or threshold.  Trim any bushes or trees on the path to your home.  Use bright outdoor lighting.  Clear any walking paths of anything that might make someone trip, such as rocks or tools.  Regularly check to see if handrails are loose or broken. Make sure that both sides of any steps have handrails.  Any raised decks and porches should have guardrails on the edges.  Have any leaves, snow, or ice cleared regularly.  Use sand or salt on walking paths during winter.  Clean up any spills in your garage right away. This includes oil or grease spills. What can I do in the bathroom?  Use night lights.  Install grab bars by the toilet and in the tub and shower. Do not use towel bars as grab bars.  Use non-skid mats or decals in the tub or shower.  If you need to sit down in the shower, use a plastic, non-slip stool.  Keep the floor dry. Clean up any water that spills on the floor as soon as it happens.  Remove soap buildup in the tub or shower regularly.  Attach bath mats securely with  double-sided non-slip rug tape.  Do not have throw rugs and other things on the floor that can make you trip. What can I do in the bedroom?  Use night lights.  Make sure that you have a light by your bed that is easy to reach.  Do not use any sheets or blankets that are too big for your bed. They should not hang down onto the floor.  Have a firm chair that has side arms. You can use this for support while you get dressed.  Do not have throw rugs and other things on the floor that can make you trip. What can I do in the kitchen?  Clean up any spills right away.  Avoid walking on wet floors.  Keep items that you use a lot in easy-to-reach places.  If you need to reach something above you, use a strong step stool that has a grab bar.  Keep electrical cords out of the way.  Do not use floor polish or wax that makes floors slippery. If you must use wax, use non-skid floor wax.  Do not have throw rugs and other things on the floor that can make you trip. What can I do  with my stairs?  Do not leave any items on the stairs.  Make sure that there are handrails on both sides of the stairs and use them. Fix handrails that are broken or loose. Make sure that handrails are as long as the stairways.  Check any carpeting to make sure that it is firmly attached to the stairs. Fix any carpet that is loose or worn.  Avoid having throw rugs at the top or bottom of the stairs. If you do have throw rugs, attach them to the floor with carpet tape.  Make sure that you have a light switch at the top of the stairs and the bottom of the stairs. If you do not have them, ask someone to add them for you. What else can I do to help prevent falls?  Wear shoes that:  Do not have high heels.  Have rubber bottoms.  Are comfortable and fit you well.  Are closed at the toe. Do not wear sandals.  If you use a stepladder:  Make sure that it is fully opened. Do not climb a closed stepladder.  Make  sure that both sides of the stepladder are locked into place.  Ask someone to hold it for you, if possible.  Clearly mark and make sure that you can see:  Any grab bars or handrails.  First and last steps.  Where the edge of each step is.  Use tools that help you move around (mobility aids) if they are needed. These include:  Canes.  Walkers.  Scooters.  Crutches.  Turn on the lights when you go into a dark area. Replace any light bulbs as soon as they burn out.  Set up your furniture so you have a clear path. Avoid moving your furniture around.  If any of your floors are uneven, fix them.  If there are any pets around you, be aware of where they are.  Review your medicines with your doctor. Some medicines can make you feel dizzy. This can increase your chance of falling. Ask your doctor what other things that you can do to help prevent falls. This information is not intended to replace advice given to you by your health care provider. Make sure you discuss any questions you have with your health care provider. Document Released: 09/10/2009 Document Revised: 04/21/2016 Document Reviewed: 12/19/2014 Elsevier Interactive Patient Education  2017 [employer].

## 2020-02-12 NOTE — Progress Notes (Signed)
This visit occurred during the SARS-CoV-2 public health emergency.  Safety protocols were in place, including screening questions prior to the visit, additional usage of staff PPE, and extensive cleaning of exam room while observing appropriate contact time as indicated for disinfecting solutions.  Subjective:   [name] is a 69 y.o. female who presents for Medicare Annual (Subsequent) preventive examination.  Review of Systems:  n/a Cardiac Risk Factors include: advanced age (>79men, >7 women);dyslipidemia;hypertension;obesity (BMI >30kg/m2);sedentary lifestyle     Objective:     Vitals: BP 118/80 (Patient Position: Sitting)   Pulse 78   Temp 98.3 F (36.8 C) (Oral)   Ht 5' 4.4" (1.636 m)   Wt 200 lb (90.7 kg)   BMI 33.90 kg/m   Body mass index is 33.9 kg/m.  Advanced Directives 02/12/2020 01/23/2019 02/08/2012 02/08/2012 01/31/2012  Does Patient Have a Medical Advance Directive? No No Patient does not have advance directive;Patient would not like information Patient does not have advance directive;Patient would not like information -  Would patient like information on creating a medical advance directive? No - Patient declined Yes (MAU/Ambulatory/Procedural Areas - Information given) - - -  Pre-existing out of facility DNR order (yellow form or pink MOST form) - - No - No    Tobacco Social History   Tobacco Use  Smoking Status Never Smoker  Smokeless Tobacco Never Used     Counseling given: Not Answered   Clinical Intake:  Pre-visit preparation completed: Yes  Pain : 0-10 Pain Score: 6  Pain Type: Chronic pain Pain Location: Back Pain Orientation: Lower Pain Descriptors / Indicators: Aching, Sharp Pain Onset: More than a month ago Pain Frequency: Constant Pain Relieving Factors: lidoderm patch helps sometimes  Pain Relieving Factors: lidoderm patch helps sometimes  Nutritional Status: BMI > 30  Obese Nutritional Risks: Nausea/ vomitting/ diarrhea(diarrhea  on Monday) Diabetes: No  How often do you need to have someone help you when you read instructions, pamphlets, or other written materials from your doctor or pharmacy?: 1 - Never What is the last grade level you completed in school?: 12th grade  Interpreter Needed?: No  Information entered by :: NAllen LPN  Past Medical History:  Diagnosis Date  . Arthritis   . Constipation   . GERD (gastroesophageal reflux disease)   . Hypertension    Past Surgical History:  Procedure Laterality Date  . ABDOMINAL HYSTERECTOMY    . BREAST SURGERY     bio  neg cancer  . EYE SURGERY  2019   bilateral cataract surgery   Family History  Problem Relation Age of Onset  . Diabetes Mother   . Heart attack Father   . Cancer Brother   . Anesthesia problems Neg Hx    Social History   Socioeconomic History  . Marital status: Married    Spouse name: Not on file  . Number of children: Not on file  . Years of education: Not on file  . Highest education level: Not on file  Occupational History  . Occupation: retired  Tobacco Use  . Smoking status: Never Smoker  . Smokeless tobacco: Never Used  Substance and Sexual Activity  . Alcohol use: No  . Drug use: No  . Sexual activity: Not Currently  Other Topics Concern  . Not on file  Social History Narrative  . Not on file   Social Determinants of Health   Financial Resource Strain: Low Risk   . Difficulty of Paying Living Expenses: Not hard at all  Food Insecurity: No Food Insecurity  . Worried About Charity fundraiser in the Last Year: Never true  . Ran Out of Food in the Last Year: Never true  Transportation Needs: No Transportation Needs  . Lack of Transportation (Medical): No  . Lack of Transportation (Non-Medical): No  Physical Activity: Inactive  . Days of Exercise per Week: 0 days  . Minutes of Exercise per Session: 0 min  Stress: Stress Concern Present  . Feeling of Stress : To some extent  Social Connections:   . Frequency  of Communication with Friends and Family:   . Frequency of Social Gatherings with Friends and Family:   . Attends Religious Services:   . Active Member of Clubs or Organizations:   . Attends Archivist Meetings:   Marland Kitchen Marital Status:     Outpatient Encounter Medications as of 02/12/2020  Medication Sig  . alendronate (FOSAMAX) 70 MG tablet TAKE 1 TABLET BY MOUTH EVERY WEEK IN THE MORNING, AT LEAST 30 MIN BEFORE FIRST FOOD, BEVERAGE, OR MEDICATION OF DAY  . aspirin 81 MG tablet Take 81 mg by mouth daily.  Marland Kitchen dexlansoprazole (DEXILANT) 60 MG capsule Take 60 mg by mouth daily.  Marland Kitchen lisinopril-hydrochlorothiazide (ZESTORETIC) 20-12.5 MG tablet TAKE 1 TABLET BY MOUTH EVERY DAY  . simvastatin (ZOCOR) 40 MG tablet TAKE 1 TABLET BY MOUTH EVERY DAY   No facility-administered encounter medications on file as of 02/12/2020.    Activities of Daily Living In your present state of health, do you have any difficulty performing the following activities: 02/12/2020 02/12/2020  Hearing? N N  Vision? N N  Difficulty concentrating or making decisions? N N  Walking or climbing stairs? N N  Dressing or bathing? N N  Doing errands, shopping? N N  Preparing Food and eating ? N -  Using the Toilet? N -  In the past six months, have you accidently leaked urine? N -  Do you have problems with loss of bowel control? N -  Managing your Medications? N -  Managing your Finances? N -  Housekeeping or managing your Housekeeping? N -  Some recent data might be hidden    Patient Care Team: [name], FNP as PCP - General (General Practice) Joseph Art, OD (Optometry)    Assessment:   This is a routine wellness examination for [place].  Exercise Activities and Dietary recommendations Current Exercise Habits: The patient does not participate in regular exercise at present  Goals    . Patient Stated     02/12/2020, wants to start exercising and eating healthier    . Weight (lb) < 200 lb (90.7 kg)  (pt-stated)     Wants to lose 5 pounds       Fall Risk Fall Risk  02/12/2020 02/12/2020 07/24/2019 03/07/2019 01/23/2019  Falls in the past year? 0 0 0 0 0  Number falls in past yr: - - - - -  Injury with Fall? - - - - -  Risk for fall due to : Medication side effect - - - -  Follow up Falls evaluation completed;Education provided;Falls prevention discussed - - - -   Is the patient's home free of loose throw rugs in walkways, pet beds, electrical cords, etc?   yes      Grab bars in the bathroom? no      Handrails on the stairs?   n/a      Adequate lighting?   yes  Timed Get Up and Go performed:  n/a  Depression Screen PHQ 2/9 Scores 02/12/2020 02/12/2020 07/24/2019 03/07/2019  PHQ - 2 Score 0 0 0 1  PHQ- 9 Score 2 - - 9     Cognitive Function     6CIT Screen 02/12/2020 01/23/2019  What Year? 0 points 0 points  What month? 0 points 0 points  What time? 0 points 0 points  Count back from 20 0 points 0 points  Months in reverse 0 points 0 points  Repeat phrase 0 points 0 points  Total Score 0 0    Immunization History  Administered Date(s) Administered  . Influenza, High Dose Seasonal PF 08/18/2018, 07/24/2019  . PFIZER SARS-COV-2 Vaccination 01/12/2020, 02/04/2020    Qualifies for Shingles Vaccine? yes  Screening Tests Health Maintenance  Topic Date Due  . PNA vac Low Risk Adult (1 of 2 - PCV13) Never done  . MAMMOGRAM  01/04/2021  . COLONOSCOPY  01/19/2022  . TETANUS/TDAP  10/30/2022  . INFLUENZA VACCINE  Completed  . DEXA SCAN  Completed  . Hepatitis C Screening  Completed    Cancer Screenings: Lung: Low Dose CT Chest recommended if Age 27-80 years, 30 pack-year currently smoking OR have quit w/in 15years. Patient does not qualify. Breast:  Up to date on Mammogram? Yes   Up to date of Bone Density/Dexa? Yes Colorectal: up to date  Additional Screenings: : Hepatitis C Screening: 01/23/2019     Plan:    Patient wants to start exercising and eating healthier.    I have personally reviewed and noted the following in the patient's chart:   . Medical and social history . Use of alcohol, tobacco or illicit drugs  . Current medications and supplements . Functional ability and status . Nutritional status . Physical activity . Advanced directives . List of other physicians . Hospitalizations, surgeries, and ER visits in previous 12 months . Vitals . Screenings to include cognitive, depression, and falls . Referrals and appointments  In addition, I have reviewed and discussed with patient certain preventive protocols, quality metrics, and best practice recommendations. A written personalized care plan for preventive services as well as general preventive health recommendations were provided to patient.     Barb Merino, LPN  [id number]

## 2020-02-13 ENCOUNTER — Encounter: Payer: Self-pay | Admitting: Nurse Practitioner

## 2020-02-13 LAB — URINE CULTURE

## 2020-02-13 LAB — CMP14+EGFR
ALT: 13 IU/L (ref 0–32)
AST: 28 IU/L (ref 0–40)
Albumin/Globulin Ratio: 1.6 (ref 1.2–2.2)
Albumin: 4.6 g/dL (ref 3.8–4.8)
Alkaline Phosphatase: 87 IU/L (ref 39–117)
BUN/Creatinine Ratio: 16 (ref 12–28)
BUN: 18 mg/dL (ref 8–27)
Bilirubin Total: 0.3 mg/dL (ref 0.0–1.2)
CO2: 26 mmol/L (ref 20–29)
Calcium: 9.9 mg/dL (ref 8.7–10.3)
Chloride: 101 mmol/L (ref 96–106)
Creatinine, Ser: 1.12 mg/dL — ABNORMAL HIGH (ref 0.57–1.00)
GFR calc Af Amer: 58 mL/min/{1.73_m2} — ABNORMAL LOW (ref >59)
GFR calc non Af Amer: 50 mL/min/{1.73_m2} — ABNORMAL LOW (ref >59)
Globulin, Total: 2.9 g/dL (ref 1.5–4.5)
Glucose: 77 mg/dL (ref 65–99)
Potassium: 4.4 mmol/L (ref 3.5–5.2)
Sodium: 140 mmol/L (ref 134–144)
Total Protein: 7.5 g/dL (ref 6.0–8.5)

## 2020-02-13 LAB — CBC
Hematocrit: 40.5 % (ref 34.0–46.6)
Hemoglobin: 12.9 g/dL (ref 11.1–15.9)
MCH: 29.5 pg (ref 26.6–33.0)
MCHC: 31.9 g/dL (ref 31.5–35.7)
MCV: 93 fL (ref 79–97)
Platelets: 384 10*3/uL (ref 150–450)
RBC: 4.37 x10E6/uL (ref 3.77–5.28)
RDW: 12.7 % (ref 11.7–15.4)
WBC: 5.9 10*3/uL (ref 3.4–10.8)

## 2020-02-13 LAB — HEMOGLOBIN A1C
Est. average glucose Bld gHb Est-mCnc: 114 mg/dL
Hgb A1c MFr Bld: 5.6 % (ref 4.8–5.6)

## 2020-02-13 LAB — LIPID PANEL
Chol/HDL Ratio: 3.4 ratio (ref 0.0–4.4)
Cholesterol, Total: 202 mg/dL — ABNORMAL HIGH (ref 100–199)
HDL: 59 mg/dL (ref >39)
LDL Chol Calc (NIH): 121 mg/dL — ABNORMAL HIGH (ref 0–99)
Triglycerides: 125 mg/dL (ref 0–149)
VLDL Cholesterol Cal: 22 mg/dL (ref 5–40)

## 2020-06-21 ENCOUNTER — Encounter (HOSPITAL_COMMUNITY): Payer: Self-pay

## 2020-06-21 ENCOUNTER — Other Ambulatory Visit: Payer: Self-pay

## 2020-06-21 ENCOUNTER — Ambulatory Visit (HOSPITAL_COMMUNITY)
Admission: EM | Admit: 2020-06-21 | Discharge: 2020-06-21 | Disposition: A | Discharge status: Home / Self Care | Payer: Medicare PPO | Attending: Emergency Medicine | Admitting: Emergency Medicine

## 2020-06-21 DIAGNOSIS — M1990 Unspecified osteoarthritis, unspecified site: Secondary | ICD-10-CM | POA: Diagnosis not present

## 2020-06-21 DIAGNOSIS — E782 Mixed hyperlipidemia: Secondary | ICD-10-CM | POA: Diagnosis not present

## 2020-06-21 DIAGNOSIS — Z20822 Contact with and (suspected) exposure to covid-19: Secondary | ICD-10-CM | POA: Insufficient documentation

## 2020-06-21 DIAGNOSIS — R7303 Prediabetes: Secondary | ICD-10-CM | POA: Diagnosis not present

## 2020-06-21 DIAGNOSIS — Z7982 Long term (current) use of aspirin: Secondary | ICD-10-CM | POA: Insufficient documentation

## 2020-06-21 DIAGNOSIS — J019 Acute sinusitis, unspecified: Secondary | ICD-10-CM

## 2020-06-21 DIAGNOSIS — R05 Cough: Secondary | ICD-10-CM | POA: Diagnosis present

## 2020-06-21 DIAGNOSIS — I10 Essential (primary) hypertension: Secondary | ICD-10-CM | POA: Diagnosis not present

## 2020-06-21 DIAGNOSIS — Z7901 Long term (current) use of anticoagulants: Secondary | ICD-10-CM | POA: Diagnosis not present

## 2020-06-21 DIAGNOSIS — Z79899 Other long term (current) drug therapy: Secondary | ICD-10-CM | POA: Insufficient documentation

## 2020-06-21 LAB — SARS CORONAVIRUS 2 (TAT 6-24 HRS): SARS Coronavirus 2: NEGATIVE

## 2020-06-21 MED ORDER — DOXYCYCLINE HYCLATE 100 MG PO CAPS: 100.0000 mg | ORAL_CAPSULE | Freq: Two times a day (BID) | ORAL | 0 refills | Status: AC

## 2020-06-21 MED ORDER — BENZONATATE 200 MG PO CAPS: 200.0000 mg | ORAL_CAPSULE | Freq: Three times a day (TID) | ORAL | 0 refills | Status: AC | PRN

## 2020-06-21 NOTE — ED Triage Notes (Addendum)
Patient is here today with complaints of a runny nose and non productive cough for the past week. Patient states she is fully vaccinated and has been taking OTC Mucinex DM. Patient states she did not know if she need a COVID Test to make sure she does not have it.  Patient denies: fever, fatigue, body aches

## 2020-06-21 NOTE — Discharge Instructions (Signed)
Covid test pending, monitor MyChart for results Begin doxycycline twice daily for the next week to cover for infection in sinuses Continue Mucinex DM Tessalon/benzonatate every 8 hours for cough Rest and fluids Follow-up if not improving or worsening

## 2020-06-22 NOTE — ED Provider Notes (Signed)
[facility]    CSN: [id number] Arrival date & time: 06/21/20  1015      History   Chief Complaint Chief Complaint  Patient presents with  . Nasal Congestion  . Cough    HPI [name] is a 69 y.o. female history of HTN, GERD presenting today for evaluation of cough and congestion.  Patient reports that she has had rhinorrhea and cough for 1 week.  She denies any sore throat.  At times she has been achy.  She has been using Mucinex DM without relief.  Conservative treatment can be totally clear with fluid.  Denies chest pain or shortness of breath.  Majority of symptoms in sinuses.  HPI  Past Medical History:  Diagnosis Date  . Arthritis   . Constipation   . GERD (gastroesophageal reflux disease)   . Hypertension     Patient Active Problem List   Diagnosis Date Noted  . Mixed hyperlipidemia 07/24/2019  . Prediabetes 07/24/2019  . Sore throat 03/07/2019  . Cough 03/07/2019  . Gastroesophageal reflux disease without esophagitis 10/15/2018  . Essential hypertension 10/15/2018    Past Surgical History:  Procedure Laterality Date  . ABDOMINAL HYSTERECTOMY    . BREAST SURGERY     bio  neg cancer  . EYE SURGERY  2019   bilateral cataract surgery    OB History   No obstetric history on file.      Home Medications    Prior to Admission medications   Medication Sig Start Date End Date Taking? Authorizing Provider  alendronate (FOSAMAX) 70 MG tablet TAKE 1 TABLET BY MOUTH EVERY WEEK IN THE MORNING, AT LEAST 30 MIN BEFORE FIRST FOOD, BEVERAGE, OR MEDICATION OF DAY 01/20/20   Arnette Felts, FNP  aspirin 81 MG tablet Take 81 mg by mouth daily.    [provider]  benzonatate (TESSALON) 200 MG capsule Take 1 capsule (200 mg total) by mouth 3 (three) times daily as needed for up to 7 days for cough. 06/21/20 06/28/20  Jerris Keltz C, PA-C  dexlansoprazole (DEXILANT) 60 MG capsule Take 60 mg by mouth daily.    [provider]  doxycycline  (VIBRAMYCIN) 100 MG capsule Take 1 capsule (100 mg total) by mouth 2 (two) times daily for 7 days. 06/21/20 06/28/20  Shrinika Blatz C, PA-C  lisinopril-hydrochlorothiazide (ZESTORETIC) 20-12.5 MG tablet TAKE 1 TABLET BY MOUTH EVERY DAY 01/03/20   Arnette Felts, FNP  simvastatin (ZOCOR) 40 MG tablet TAKE 1 TABLET BY MOUTH EVERY DAY 01/03/20   Arnette Felts, FNP    Family History Family History  Problem Relation Age of Onset  . Diabetes Mother   . Heart attack Father   . Cancer Brother   . Anesthesia problems Neg Hx     Social History Social History   Tobacco Use  . Smoking status: Never Smoker  . Smokeless tobacco: Never Used  Vaping Use  . Vaping Use: Never used  Substance Use Topics  . Alcohol use: No  . Drug use: No     Allergies   Penicillins   Review of Systems Review of Systems  Constitutional: Negative for activity change, appetite change, chills, fatigue and fever.  HENT: Positive for congestion, rhinorrhea and sinus pressure. Negative for ear pain, sore throat and trouble swallowing.   Eyes: Negative for discharge and redness.  Respiratory: Positive for cough. Negative for chest tightness and shortness of breath.   Cardiovascular: Negative for chest pain.  Gastrointestinal: Negative for abdominal pain, diarrhea,  nausea and vomiting.  Musculoskeletal: Negative for myalgias.  Skin: Negative for rash.  Neurological: Negative for dizziness, light-headedness and headaches.     Physical Exam Triage Vital Signs ED Triage Vitals  Enc Vitals Group     BP 06/21/20 1135 (!) 151/74     Pulse Rate 06/21/20 1135 65     Resp 06/21/20 1135 16     Temp 06/21/20 1137 98.8 F (37.1 C)     Temp Source 06/21/20 1137 Oral     SpO2 06/21/20 1135 100 %     Weight --      Height --      Head Circumference --      Peak Flow --      Pain Score 06/21/20 1137 0     Pain Loc --      Pain Edu? --      Excl. in GC? --    No data found.  Updated Vital Signs BP (!) 134/65 (BP  Location: Left Arm)   Pulse 68   Temp 98.8 F (37.1 C) (Oral)   Resp 16   SpO2 100%   Visual Acuity Right Eye Distance:   Left Eye Distance:   Bilateral Distance:    Right Eye Near:   Left Eye Near:    Bilateral Near:     Physical Exam Vitals and nursing note reviewed.  Constitutional:      Appearance: She is well-developed.     Comments: No acute distress  HENT:     Head: Normocephalic and atraumatic.     Ears:     Comments: Bilateral ears without tenderness to palpation of external auricle, tragus and mastoid, EAC's without erythema or swelling, TM's with good bony landmarks and cone of light. Non erythematous.     Nose: Nose normal.     Mouth/Throat:     Comments: Oral mucosa pink and moist, no tonsillar enlargement or exudate. Posterior pharynx patent and nonerythematous, no uvula deviation or swelling. Normal phonation.  Eyes:     Conjunctiva/sclera: Conjunctivae normal.  Cardiovascular:     Rate and Rhythm: Normal rate.  Pulmonary:     Effort: Pulmonary effort is normal. No respiratory distress.     Comments: Breathing comfortably at rest, CTABL, no wheezing, rales or other adventitious sounds auscultated  Abdominal:     General: There is no distension.  Musculoskeletal:        General: Normal range of motion.     Cervical back: Neck supple.  Skin:    General: Skin is warm and dry.  Neurological:     Mental Status: She is alert and oriented to person, place, and time.      UC Treatments / Results  Labs (all labs ordered are listed, but only abnormal results are displayed) Labs Reviewed  SARS CORONAVIRUS 2 (TAT 6-24 HRS)    EKG   Radiology No results found.  Procedures Procedures (including critical care time)  Medications Ordered in UC Medications - No data to display  Initial Impression / Assessment and Plan / UC Course  I have reviewed the triage vital signs and the nursing notes.  Pertinent labs & imaging results that were available  during my care of the patient were reviewed by me and considered in my medical decision making (see chart for details).     URI symptoms greater than 1 week, Covid test pending for screening.  Treating for sinusitis with doxycycline and also recommending to continue symptomatic and supportive care.  Rest  and fluids.  Lungs clear to auscultation, vital signs stable, low suspicion of pneumonia at this time.  Discussed strict return precautions. Patient verbalized understanding and is agreeable with plan.  Final Clinical Impressions(s) / UC Diagnoses   Final diagnoses:  Acute sinusitis with symptoms > 10 days     Discharge Instructions     Covid test pending, monitor MyChart for results Begin doxycycline twice daily for the next week to cover for infection in sinuses Continue Mucinex DM Tessalon/benzonatate every 8 hours for cough Rest and fluids Follow-up if not improving or worsening   ED Prescriptions    Medication Sig Dispense Auth. Provider   doxycycline (VIBRAMYCIN) 100 MG capsule Take 1 capsule (100 mg total) by mouth 2 (two) times daily for 7 days. 14 capsule Shephanie Romas C, PA-C   benzonatate (TESSALON) 200 MG capsule Take 1 capsule (200 mg total) by mouth 3 (three) times daily as needed for up to 7 days for cough. 28 capsule Cyntha Brickman, [place] C, PA-C     PDMP not reviewed this encounter.   Lew Dawes, [place] 06/22/20 [phone]

## 2020-06-27 ENCOUNTER — Other Ambulatory Visit: Payer: Self-pay | Admitting: Nurse Practitioner

## 2020-06-27 DIAGNOSIS — I10 Essential (primary) hypertension: Secondary | ICD-10-CM

## 2020-06-27 DIAGNOSIS — E782 Mixed hyperlipidemia: Secondary | ICD-10-CM

## 2020-08-17 ENCOUNTER — Encounter: Payer: Self-pay | Admitting: Nurse Practitioner

## 2020-08-17 ENCOUNTER — Other Ambulatory Visit: Payer: Self-pay

## 2020-08-17 ENCOUNTER — Ambulatory Visit: Payer: Medicare PPO | Admitting: Nurse Practitioner

## 2020-08-17 VITALS — BP 122/76 | HR 84 | Temp 98.5°F | Ht 64.4 in | Wt 200.0 lb

## 2020-08-17 DIAGNOSIS — F5102 Adjustment insomnia: Secondary | ICD-10-CM

## 2020-08-17 DIAGNOSIS — F4321 Adjustment disorder with depressed mood: Secondary | ICD-10-CM

## 2020-08-17 DIAGNOSIS — I10 Essential (primary) hypertension: Secondary | ICD-10-CM | POA: Diagnosis not present

## 2020-08-17 DIAGNOSIS — Z6833 Body mass index (BMI) 33.0-33.9, adult: Secondary | ICD-10-CM

## 2020-08-17 DIAGNOSIS — E2839 Other primary ovarian failure: Secondary | ICD-10-CM | POA: Diagnosis not present

## 2020-08-17 DIAGNOSIS — E782 Mixed hyperlipidemia: Secondary | ICD-10-CM

## 2020-08-17 DIAGNOSIS — Z23 Encounter for immunization: Secondary | ICD-10-CM | POA: Diagnosis not present

## 2020-08-17 DIAGNOSIS — R7303 Prediabetes: Secondary | ICD-10-CM

## 2020-08-17 MED ORDER — TRAZODONE HCL 50 MG PO TABS: 50.0000 mg | ORAL_TABLET | Freq: Every evening | ORAL | 2 refills | Status: DC | PRN

## 2020-08-17 MED ORDER — ALENDRONATE SODIUM 70 MG PO TABS: 70.0000 mg | ORAL_TABLET | ORAL | 2 refills | Status: DC

## 2020-08-17 MED ORDER — SHINGRIX 50 MCG/0.5ML IM SUSR: 0.5000 mL | Freq: Once | INTRAMUSCULAR | 0 refills | Status: AC

## 2020-08-17 NOTE — Patient Instructions (Addendum)
Hypertension, Adult Hypertension is another name for high blood pressure. High blood pressure forces your heart to work harder to pump blood. This can cause problems over time. There are two numbers in a blood pressure reading. There is a top number (systolic) over a bottom number (diastolic). It is best to have a blood pressure that is below 120/80. Healthy choices can help lower your blood pressure, or you may need medicine to help lower it. What are the causes? The cause of this condition is not known. Some conditions may be related to high blood pressure. What increases the risk?  Smoking.  Having type 2 diabetes mellitus, high cholesterol, or both.  Not getting enough exercise or physical activity.  Being overweight.  Having too much fat, sugar, calories, or salt (sodium) in your diet.  Drinking too much alcohol.  Having long-term (chronic) kidney disease.  Having a family history of high blood pressure.  Age. Risk increases with age.  Race. You may be at higher risk if you are African American.  Gender. Men are at higher risk than women before age 45. After age 65, women are at higher risk than men.  Having obstructive sleep apnea.  Stress. What are the signs or symptoms?  High blood pressure may not cause symptoms. Very high blood pressure (hypertensive crisis) may cause: ? Headache. ? Feelings of worry or nervousness (anxiety). ? Shortness of breath. ? Nosebleed. ? A feeling of being sick to your stomach (nausea). ? Throwing up (vomiting). ? Changes in how you see. ? Very bad chest pain. ? Seizures. How is this treated?  This condition is treated by making healthy lifestyle changes, such as: ? Eating healthy foods. ? Exercising more. ? Drinking less alcohol.  Your health care provider may prescribe medicine if lifestyle changes are not enough to get your blood pressure under control, and if: ? Your top number is above 130. ? Your bottom number is above  80.  Your personal target blood pressure may vary. Follow these instructions at home: Eating and drinking   If told, follow the DASH eating plan. To follow this plan: ? Fill one half of your plate at each meal with fruits and vegetables. ? Fill one fourth of your plate at each meal with whole grains. Whole grains include whole-wheat pasta, brown rice, and whole-grain bread. ? Eat or drink low-fat dairy products, such as skim milk or low-fat yogurt. ? Fill one fourth of your plate at each meal with low-fat (lean) proteins. Low-fat proteins include fish, chicken without skin, eggs, beans, and tofu. ? Avoid fatty meat, cured and processed meat, or chicken with skin. ? Avoid pre-made or processed food.  Eat less than 1,500 mg of salt each day.  Do not drink alcohol if: ? Your doctor tells you not to drink. ? You are pregnant, may be pregnant, or are planning to become pregnant.  If you drink alcohol: ? Limit how much you use to:  0-1 drink a day for women.  0-2 drinks a day for men. ? Be aware of how much alcohol is in your drink. In the [place] one drink equals one 12 oz bottle of beer (355 mL), one 5 oz glass of wine (148 mL), or one 1 oz glass of hard liquor (44 mL). Lifestyle   Work with your doctor to stay at a healthy weight or to lose weight. Ask your doctor what the best weight is for you.  Get at least 30 minutes of exercise most   days of the week. This may include walking, swimming, or biking.  Get at least 30 minutes of exercise that strengthens your muscles (resistance exercise) at least 3 days a week. This may include lifting weights or doing Pilates.  Do not use any products that contain nicotine or tobacco, such as cigarettes, e-cigarettes, and chewing tobacco. If you need help quitting, ask your doctor.  Check your blood pressure at home as told by your doctor.  Keep all follow-up visits as told by your doctor. This is important. Medicines  Take over-the-counter  and prescription medicines only as told by your doctor. Follow directions carefully.  Do not skip doses of blood pressure medicine. The medicine does not work as well if you skip doses. Skipping doses also puts you at risk for problems.  Ask your doctor about side effects or reactions to medicines that you should watch for. Contact a doctor if you:  Think you are having a reaction to the medicine you are taking.  Have headaches that keep coming back (recurring).  Feel dizzy.  Have swelling in your ankles.  Have trouble with your vision. Get help right away if you:  Get a very bad headache.  Start to feel mixed up (confused).  Feel weak or numb.  Feel faint.  Have very bad pain in your: ? Chest. ? Belly (abdomen).  Throw up more than once.  Have trouble breathing. Summary  Hypertension is another name for high blood pressure.  High blood pressure forces your heart to work harder to pump blood.  For most people, a normal blood pressure is less than 120/80.  Making healthy choices can help lower blood pressure. If your blood pressure does not get lower with healthy choices, you may need to take medicine. This information is not intended to replace advice given to you by your health care provider. Make sure you discuss any questions you have with your health care provider. Document Revised: 07/25/2018 Document Reviewed: 07/25/2018 Elsevier Patient Education  [place].  Influenza Virus Vaccine (Flucelvax) What is this medicine? INFLUENZA VIRUS VACCINE (in floo EN zuh VAHY ruhs vak SEEN) helps to reduce the risk of getting influenza also known as the flu. The vaccine only helps protect you against some strains of the flu. This medicine may be used for other purposes; ask your health care provider or pharmacist if you have questions. COMMON BRAND NAME(S): FLUCELVAX What should I tell my health care provider before I take this medicine? They need to know if you  have any of these conditions:  bleeding disorder like hemophilia  fever or infection  Guillain-Barre syndrome or other neurological problems  immune system problems  infection with the human immunodeficiency virus (HIV) or AIDS  low blood platelet counts  multiple sclerosis  an unusual or allergic reaction to influenza virus vaccine, other medicines, foods, dyes or preservatives  pregnant or trying to get pregnant  breast-feeding How should I use this medicine? This vaccine is for injection into a muscle. It is given by a health care professional. A copy of Vaccine Information Statements will be given before each vaccination. Read this sheet carefully each time. The sheet may change frequently. Talk to your pediatrician regarding the use of this medicine in children. Special care may be needed. Overdosage: If you think you've taken too much of this medicine contact a poison control center or emergency room at once. Overdosage: If you think you have taken too much of this medicine contact a poison control  center or emergency room at once. NOTE: This medicine is only for you. Do not share this medicine with others. What if I miss a dose? This does not apply. What may interact with this medicine?  chemotherapy or radiation therapy  medicines that lower your immune system like etanercept, anakinra, infliximab, and adalimumab  medicines that treat or prevent blood clots like warfarin  phenytoin  steroid medicines like prednisone or cortisone  theophylline  vaccines This list may not describe all possible interactions. Give your health care provider a list of all the medicines, herbs, non-prescription drugs, or dietary supplements you use. Also tell them if you smoke, drink alcohol, or use illegal drugs. Some items may interact with your medicine. What should I watch for while using this medicine? Report any side effects that do not go away within 3 days to your doctor or  health care professional. Call your health care provider if any unusual symptoms occur within 6 weeks of receiving this vaccine. You may still catch the flu, but the illness is not usually as bad. You cannot get the flu from the vaccine. The vaccine will not protect against colds or other illnesses that may cause fever. The vaccine is needed every year. What side effects may I notice from receiving this medicine? Side effects that you should report to your doctor or health care professional as soon as possible:  allergic reactions like skin rash, itching or hives, swelling of the face, lips, or tongue Side effects that usually do not require medical attention (Report these to your doctor or health care professional if they continue or are bothersome.):  fever  headache  muscle aches and pains  pain, tenderness, redness, or swelling at the injection site  tiredness This list may not describe all possible side effects. Call your doctor for medical advice about side effects. You may report side effects to FDA at 1-800-FDA-1088. Where should I keep my medicine? The vaccine will be given by a health care professional in a clinic, pharmacy, doctor's office, or other health care setting. You will not be given vaccine doses to store at home. NOTE: This sheet is a summary. It may not cover all possible information. If you have questions about this medicine, talk to your doctor, pharmacist, or health care provider.  2020 Elsevier/Gold Standard (2011-10-26 14:06:47)   Insomnia Insomnia is a sleep disorder that makes it difficult to fall asleep or stay asleep. Insomnia can cause fatigue, low energy, difficulty concentrating, mood swings, and poor performance at work or school. There are three different ways to classify insomnia:  Difficulty falling asleep.  Difficulty staying asleep.  Waking up too early in the morning. Any type of insomnia can be long-term (chronic) or short-term (acute). Both  are common. Short-term insomnia usually lasts for three months or less. Chronic insomnia occurs at least three times a week for longer than three months. What are the causes? Insomnia may be caused by another condition, situation, or substance, such as:  Anxiety.  Certain medicines.  Gastroesophageal reflux disease (GERD) or other gastrointestinal conditions.  Asthma or other breathing conditions.  Restless legs syndrome, sleep apnea, or other sleep disorders.  Chronic pain.  Menopause.  Stroke.  Abuse of alcohol, tobacco, or illegal drugs.  Mental health conditions, such as depression.  Caffeine.  Neurological disorders, such as Alzheimer's disease.  An overactive thyroid (hyperthyroidism). Sometimes, the cause of insomnia may not be known. What increases the risk? Risk factors for insomnia include:  Gender. Women are  affected more often than men.  Age. Insomnia is more common as you get older.  Stress.  Lack of exercise.  Irregular work schedule or working night shifts.  Traveling between different time zones.  Certain medical and mental health conditions. What are the signs or symptoms? If you have insomnia, the main symptom is having trouble falling asleep or having trouble staying asleep. This may lead to other symptoms, such as:  Feeling fatigued or having low energy.  Feeling nervous about going to sleep.  Not feeling rested in the morning.  Having trouble concentrating.  Feeling irritable, anxious, or depressed. How is this diagnosed? This condition may be diagnosed based on:  Your symptoms and medical history. Your health care provider may ask about: ? Your sleep habits. ? Any medical conditions you have. ? Your mental health.  A physical exam. How is this treated? Treatment for insomnia depends on the cause. Treatment may focus on treating an underlying condition that is causing insomnia. Treatment may also include:  Medicines to help  you sleep.  Counseling or therapy.  Lifestyle adjustments to help you sleep better. Follow these instructions at home: Eating and drinking   Limit or avoid alcohol, caffeinated beverages, and cigarettes, especially close to bedtime. These can disrupt your sleep.  Do not eat a large meal or eat spicy foods right before bedtime. This can lead to digestive discomfort that can make it hard for you to sleep. Sleep habits   Keep a sleep diary to help you and your health care provider figure out what could be causing your insomnia. Write down: ? When you sleep. ? When you wake up during the night. ? How well you sleep. ? How rested you feel the next day. ? Any side effects of medicines you are taking. ? What you eat and drink.  Make your bedroom a dark, comfortable place where it is easy to fall asleep. ? Put up shades or blackout curtains to block light from outside. ? Use a white noise machine to block noise. ? Keep the temperature cool.  Limit screen use before bedtime. This includes: ? Watching TV. ? Using your smartphone, tablet, or computer.  Stick to a routine that includes going to bed and waking up at the same times every day and night. This can help you fall asleep faster. Consider making a quiet activity, such as reading, part of your nighttime routine.  Try to avoid taking naps during the day so that you sleep better at night.  Get out of bed if you are still awake after 15 minutes of trying to sleep. Keep the lights down, but try reading or doing a quiet activity. When you feel sleepy, go back to bed. General instructions  Take over-the-counter and prescription medicines only as told by your health care provider.  Exercise regularly, as told by your health care provider. Avoid exercise starting several hours before bedtime.  Use relaxation techniques to manage stress. Ask your health care provider to suggest some techniques that may work well for you. These may  include: ? Breathing exercises. ? Routines to release muscle tension. ? Visualizing peaceful scenes.  Make sure that you drive carefully. Avoid driving if you feel very sleepy.  Keep all follow-up visits as told by your health care provider. This is important. Contact a health care provider if:  You are tired throughout the day.  You have trouble in your daily routine due to sleepiness.  You continue to have sleep problems, or  your sleep problems get worse. Get help right away if:  You have serious thoughts about hurting yourself or someone else. If you ever feel like you may hurt yourself or others, or have thoughts about taking your own life, get help right away. You can go to your nearest emergency department or call:  Your local emergency services (911 in the [place]  A suicide crisis helpline, such as the National Suicide Prevention Lifeline at (706)646-46801-(807)003-5832. This is open 24 hours a day. Summary  Insomnia is a sleep disorder that makes it difficult to fall asleep or stay asleep.  Insomnia can be long-term (chronic) or short-term (acute).  Treatment for insomnia depends on the cause. Treatment may focus on treating an underlying condition that is causing insomnia.  Keep a sleep diary to help you and your health care provider figure out what could be causing your insomnia. This information is not intended to replace advice given to you by your health care provider. Make sure you discuss any questions you have with your health care provider. Document Revised: 10/27/2017 Document Reviewed: 08/24/2017 Elsevier Patient Education  2020 [employer]   Managing Loss, Adult People experience loss in many different ways throughout their lives. Events such as moving, changing jobs, and losing friends can create a sense of loss. The loss may be as serious as a major health change, divorce, death of a pet, or death of a loved one. All of these types of loss are likely to create a  physical and emotional reaction known as grief. Grief is the result of a major change or an absence of something or someone that you count on. Grief is a normal reaction to loss. A variety of factors can affect your grieving experience, including:  The nature of your loss.  Your relationship to what or whom you lost.  Your understanding of grief and how to manage it.  Your support system. How to manage lifestyle changes Keep to your normal routine as much as possible.  If you have trouble focusing or doing normal activities, it is acceptable to take some time away from your normal routine.  Spend time with friends and loved ones.  Eat a healthy diet, get plenty of sleep, and rest when you feel tired. How to recognize changes  The way that you deal with your grief will affect your ability to function as you normally do. When grieving, you may experience these changes:  Numbness, shock, sadness, anxiety, anger, denial, and guilt.  Thoughts about death.  Unexpected crying.  A physical sensation of emptiness in your stomach.  Problems sleeping and eating.  Tiredness (fatigue).  Loss of interest in normal activities.  Dreaming about or imagining seeing the person who died.  A need to remember what or whom you lost.  Difficulty thinking about anything other than your loss for a period of time.  Relief. If you have been expecting the loss for a while, you may feel a sense of relief when it happens. Follow these instructions at home:  Activity Express your feelings in healthy ways, such as:  Talking with others about your loss. It may be helpful to find others who have had a similar loss, such as a support group.  Writing down your feelings in a journal.  Doing physical activities to release stress and emotional energy.  Doing creative activities like painting, sculpting, or playing or listening to music.  Practicing resilience. This is the ability to recover and adjust  after facing challenges.  Reading some resources that encourage resilience may help you to learn ways to practice those behaviors. General instructions  Be patient with yourself and others. Allow the grieving process to happen, and remember that grieving takes time. ? It is likely that you may never feel completely done with some grief. You may find a way to move on while still cherishing memories and feelings about your loss. ? Accepting your loss is a process. It can take months or longer to adjust.  Keep all follow-up visits as told by your health care provider. This is important. Where to find support To get support for managing loss:  Ask your health care provider for help and recommendations, such as grief counseling or therapy.  Think about joining a support group for people who are managing a loss. Where to find more information You can find more information about managing loss from:  American Society of Clinical Oncology: www.cancer.net  American Psychological Association: [employer] Contact a health care provider if:  Your grief is extreme and keeps getting worse.  You have ongoing grief that does not improve.  Your body shows symptoms of grief, such as illness.  You feel depressed, anxious, or lonely. Get help right away if:  You have thoughts about hurting yourself or others. If you ever feel like you may hurt yourself or others, or have thoughts about taking your own life, get help right away. You can go to your nearest emergency department or call:  Your local emergency services (911 in the [place]  A suicide crisis helpline, such as the National Suicide Prevention Lifeline at 906-017-0687. This is open 24 hours a day. Summary  Grief is the result of a major change or an absence of someone or something that you count on. Grief is a normal reaction to loss.  The depth of grief and the period of recovery depend on the type of loss and your ability to adjust to  the change and process your feelings.  Processing grief requires patience and a willingness to accept your feelings and talk about your loss with people who are supportive.  It is important to find resources that work for you and to realize that people experience grief differently. There is not one grieving process that works for everyone in the same way.  Be aware that when grief becomes extreme, it can lead to more severe issues like isolation, depression, anxiety, or suicidal thoughts. Talk with your health care provider if you have any of these issues. This information is not intended to replace advice given to you by your health care provider. Make sure you discuss any questions you have with your health care provider. Document Revised: 01/18/2019 Document Reviewed: 03/30/2017 Elsevier Patient Education  2020 [employer].

## 2020-08-17 NOTE — Progress Notes (Signed)
I,Yamilka Roman [employer] as a Education administrator for [employer], FNP.,have documented all relevant documentation on the behalf of [name], FNP,as directed by  [name], FNP while in the presence of [name], [place]. This visit occurred during the SARS-CoV-2 public health emergency.  Safety protocols were in place, including screening questions prior to the visit, additional usage of staff PPE, and extensive cleaning of exam room while observing appropriate contact time as indicated for disinfecting solutions.  Subjective:     Patient ID: [name] , female    DOB: 08/19/1951 , 69 y.o.   MRN: [id number]   Chief Complaint  Patient presents with  . Hypertension  . Hyperlipidemia    HPI  Insomnia Primary symptoms: sleep disturbance.  The onset quality is gradual. The problem is unchanged. PMH includes: associated symptoms present.     Past Medical History:  Diagnosis Date  . Arthritis   . Constipation   . GERD (gastroesophageal reflux disease)   . Hypertension      Family History  Problem Relation Age of Onset  . Diabetes Mother   . Heart attack Father   . Cancer Brother   . Anesthesia problems Neg Hx      Current Outpatient Medications:  .  alendronate (FOSAMAX) 70 MG tablet, Take 1 tablet (70 mg total) by mouth once a week. Take with a full glass of water on an empty stomach., Disp: 12 tablet, Rfl: 2 .  aspirin 81 MG tablet, Take 81 mg by mouth daily., Disp: , Rfl:  .  dexlansoprazole (DEXILANT) 60 MG capsule, Take 60 mg by mouth daily., Disp: , Rfl:  .  lisinopril-hydrochlorothiazide (ZESTORETIC) 20-12.5 MG tablet, TAKE 1 TABLET BY MOUTH EVERY DAY, Disp: 90 tablet, Rfl: 1 .  simvastatin (ZOCOR) 40 MG tablet, TAKE 1 TABLET BY MOUTH EVERY DAY, Disp: 90 tablet, Rfl: 1 .  traZODone (DESYREL) 50 MG tablet, Take 1 tablet (50 mg total) by mouth at bedtime as needed for sleep., Disp: 30 tablet, Rfl: 2   Allergies  Allergen Reactions  . Penicillins Hives     Review  of Systems  Constitutional: Negative.   Respiratory: Negative.   Cardiovascular: Negative.   Psychiatric/Behavioral: Positive for sleep disturbance. The patient has insomnia.      Today's Vitals   08/17/20 1414  BP: 122/76  Pulse: 84  Temp: 98.5 F (36.9 C)  TempSrc: Oral  Weight: 200 lb (90.7 kg)  Height: 5' 4.4" (1.636 m)  PainSc: 0-No pain   Body mass index is 33.9 kg/m.   Objective:  Physical Exam Vitals reviewed.  Constitutional:      General: She is not in acute distress. Cardiovascular:     Rate and Rhythm: Normal rate and regular rhythm.     Pulses: Normal pulses.     Heart sounds: Normal heart sounds. No murmur heard.   Pulmonary:     Effort: Pulmonary effort is normal. No respiratory distress.     Breath sounds: Normal breath sounds.  Neurological:     General: No focal deficit present.     Mental Status: She is alert and oriented to person, place, and time.     Cranial Nerves: No cranial nerve deficit.  Psychiatric:        Mood and Affect: Mood normal.        Behavior: Behavior normal.        Thought Content: Thought content normal.        Judgment: Judgment normal.  Assessment And Plan:     1. Mixed hyperlipidemia Chronic, continue current medications and avoid fried and fatty foods. Will check lipid panel - CMP14+EGFR - Lipid panel  2. Essential hypertension Chronic, good control Continue with current medications - CMP14+EGFR  3. BMI 33.0-33.9,adult She is encouraged to strive for BMI less than 30 to decrease cardiac risk. Advised to aim for at least 150 minutes of exercise per week.  4. Prediabetes  Chronic, controlled  no current medications  Encouraged to limit intake of sugary foods and drinks  Encouraged to increase physical activity to 150 minutes per week  5. Decreased estrogen level - alendronate (FOSAMAX) 70 MG tablet; Take 1 tablet (70 mg total) by mouth once a week. Take with a full glass of water on an empty  stomach.  Dispense: 12 tablet; Refill: 2 - [facility] Bone Density; Future  6. Adjustment insomnia She is to try trazodone she can start with a 1/2 tab as needed - traZODone (DESYREL) 50 MG tablet; Take 1 tablet (50 mg total) by mouth at bedtime as needed for sleep.  Dispense: 30 tablet; Refill: 2  7. Grief Offered counseling however not interested at this time - traZODone (DESYREL) 50 MG tablet; Take 1 tablet (50 mg total) by mouth at bedtime as needed for sleep.  Dispense: 30 tablet; Refill: 2  8. Encounter for immunization - Zoster Vaccine Adjuvanted [facility]) injection; Inject 0.5 mLs into the muscle once for 1 dose.  Dispense: 0.5 mL; Refill: 0  9. Need for influenza vaccination  Influenza vaccine administered  Encouraged to take Tylenol as needed for fever or muscle aches. - Flu Vaccine QUAD High Dose(Fluad)     Patient was given opportunity to ask questions. Patient verbalized understanding of the plan and was able to repeat key elements of the plan. All questions were answered to their satisfaction.   Teola Bradley, FNP, have reviewed all documentation for this visit. The documentation on 08/27/20 for the exam, diagnosis, procedures, and orders are all accurate and complete.  THE PATIENT IS ENCOURAGED TO PRACTICE SOCIAL DISTANCING DUE TO THE COVID-19 PANDEMIC.

## 2020-08-18 LAB — CMP14+EGFR
ALT: 15 IU/L (ref 0–32)
AST: 27 IU/L (ref 0–40)
Albumin/Globulin Ratio: 1.8 (ref 1.2–2.2)
Albumin: 4.5 g/dL (ref 3.8–4.8)
Alkaline Phosphatase: 99 IU/L (ref 44–121)
BUN/Creatinine Ratio: 15 (ref 12–28)
BUN: 15 mg/dL (ref 8–27)
Bilirubin Total: 0.2 mg/dL (ref 0.0–1.2)
CO2: 25 mmol/L (ref 20–29)
Calcium: 9.4 mg/dL (ref 8.7–10.3)
Chloride: 100 mmol/L (ref 96–106)
Creatinine, Ser: 0.98 mg/dL (ref 0.57–1.00)
GFR calc Af Amer: 68 mL/min/{1.73_m2} (ref >59)
GFR calc non Af Amer: 59 mL/min/{1.73_m2} — ABNORMAL LOW (ref >59)
Globulin, Total: 2.5 g/dL (ref 1.5–4.5)
Glucose: 77 mg/dL (ref 65–99)
Potassium: 4.3 mmol/L (ref 3.5–5.2)
Sodium: 138 mmol/L (ref 134–144)
Total Protein: 7 g/dL (ref 6.0–8.5)

## 2020-08-18 LAB — LIPID PANEL
Chol/HDL Ratio: 3.7 ratio (ref 0.0–4.4)
Cholesterol, Total: 195 mg/dL (ref 100–199)
HDL: 53 mg/dL (ref >39)
LDL Chol Calc (NIH): 117 mg/dL — ABNORMAL HIGH (ref 0–99)
Triglycerides: 141 mg/dL (ref 0–149)
VLDL Cholesterol Cal: 25 mg/dL (ref 5–40)

## 2020-09-10 ENCOUNTER — Other Ambulatory Visit: Payer: Self-pay | Admitting: Nurse Practitioner

## 2020-09-10 DIAGNOSIS — F5102 Adjustment insomnia: Secondary | ICD-10-CM

## 2020-09-10 DIAGNOSIS — F4321 Adjustment disorder with depressed mood: Secondary | ICD-10-CM

## 2020-09-12 NOTE — Telephone Encounter (Signed)
DX code needed.

## 2020-09-29 ENCOUNTER — Ambulatory Visit: Payer: Medicare PPO | Admitting: Nurse Practitioner

## 2020-09-30 NOTE — Telephone Encounter (Signed)
Spoke with pharmacy not sure why we received that but they said she just picked up a refill 10/22 and she has another refill as well. So we can discard that message about the DG:UYQI

## 2020-11-11 ENCOUNTER — Other Ambulatory Visit: Payer: Self-pay | Admitting: Nurse Practitioner

## 2020-11-11 DIAGNOSIS — F4321 Adjustment disorder with depressed mood: Secondary | ICD-10-CM

## 2020-11-11 DIAGNOSIS — F5102 Adjustment insomnia: Secondary | ICD-10-CM

## 2020-12-07 DIAGNOSIS — Z1152 Encounter for screening for COVID-19: Secondary | ICD-10-CM | POA: Diagnosis not present

## 2021-01-05 ENCOUNTER — Other Ambulatory Visit: Payer: Self-pay | Admitting: Nurse Practitioner

## 2021-01-05 DIAGNOSIS — E782 Mixed hyperlipidemia: Secondary | ICD-10-CM

## 2021-01-05 DIAGNOSIS — I10 Essential (primary) hypertension: Secondary | ICD-10-CM

## 2021-01-07 DIAGNOSIS — K219 Gastro-esophageal reflux disease without esophagitis: Secondary | ICD-10-CM | POA: Diagnosis not present

## 2021-01-07 DIAGNOSIS — E669 Obesity, unspecified: Secondary | ICD-10-CM | POA: Diagnosis not present

## 2021-01-12 DIAGNOSIS — Z1231 Encounter for screening mammogram for malignant neoplasm of breast: Secondary | ICD-10-CM | POA: Diagnosis not present

## 2021-01-12 LAB — HM MAMMOGRAPHY

## 2021-01-13 ENCOUNTER — Encounter: Payer: Self-pay | Admitting: Nurse Practitioner

## 2021-01-20 ENCOUNTER — Other Ambulatory Visit: Payer: Self-pay

## 2021-01-20 ENCOUNTER — Telehealth (INDEPENDENT_AMBULATORY_CARE_PROVIDER_SITE_OTHER): Payer: Medicare PPO | Admitting: Nurse Practitioner

## 2021-01-20 ENCOUNTER — Encounter: Payer: Self-pay | Admitting: Nurse Practitioner

## 2021-01-20 VITALS — Ht 67.0 in | Wt 202.4 lb

## 2021-01-20 DIAGNOSIS — R110 Nausea: Secondary | ICD-10-CM | POA: Diagnosis not present

## 2021-01-20 DIAGNOSIS — R197 Diarrhea, unspecified: Secondary | ICD-10-CM | POA: Diagnosis not present

## 2021-01-20 NOTE — Progress Notes (Signed)
Virtual Visit via MyChart   This visit type was conducted due to national recommendations for restrictions regarding the COVID-19 Pandemic (e.g. social distancing) in an effort to limit this patient's exposure and mitigate transmission in our community.  Due to her co-morbid illnesses, this patient is at least at moderate risk for complications without adequate follow up.  This format is felt to be most appropriate for this patient at this time.  All issues noted in this document were discussed and addressed.  A limited physical exam was performed with this format.    This visit type was conducted due to national recommendations for restrictions regarding the COVID-19 Pandemic (e.g. social distancing) in an effort to limit this patient's exposure and mitigate transmission in our community.  Patients identity confirmed using two different identifiers.  This format is felt to be most appropriate for this patient at this time.  All issues noted in this document were discussed and addressed.  No physical exam was performed (except for noted visual exam findings with Video Visits).    Date:  01/20/2021   ID:  [name], DOB June 06, 1951, MRN [id number]  Patient Location:    Provider location:   Office    Chief Complaint:  diarrhea  History of Present Illness:    [name] is a 70 y.o. female who presents via video conferencing for a telehealth visit today.    The patient does have symptoms concerning for COVID-19 infection (fever, chills, cough, or new shortness of breath). She is having diarrhea  After eating a deli sandwich on Sunday she had heart burn, she took antaacid. She is having diarrhea after anything she eats. She is not able to keep anything down. Sunday she took antidiarrheal medication.  She has been cramping in her legs over the last several.    She took an at home covid test which was negative yesterday.      Past Medical History:  Diagnosis Date  . Arthritis   .  Constipation   . GERD (gastroesophageal reflux disease)   . Hypertension    Past Surgical History:  Procedure Laterality Date  . ABDOMINAL HYSTERECTOMY    . BREAST SURGERY     bio  neg cancer  . EYE SURGERY  2019   bilateral cataract surgery     Current Meds  Medication Sig  . alendronate (FOSAMAX) 70 MG tablet Take 1 tablet (70 mg total) by mouth once a week. Take with a full glass of water on an empty stomach.  Marland Kitchen aspirin 81 MG tablet Take 81 mg by mouth daily.  Marland Kitchen dexlansoprazole (DEXILANT) 60 MG capsule Take 60 mg by mouth daily.  Marland Kitchen lisinopril-hydrochlorothiazide (ZESTORETIC) 20-12.5 MG tablet TAKE 1 TABLET BY MOUTH EVERY DAY  . simvastatin (ZOCOR) 40 MG tablet TAKE 1 TABLET BY MOUTH EVERY DAY     Allergies:   Penicillins   Social History   Tobacco Use  . Smoking status: Never Smoker  . Smokeless tobacco: Never Used  Vaping Use  . Vaping Use: Never used  Substance Use Topics  . Alcohol use: No  . Drug use: No     Family Hx: The patient's family history includes Cancer in her brother; Diabetes in her mother; Heart attack in her father. There is no history of Anesthesia problems.  ROS:   Please see the history of present illness.    Review of Systems  Constitutional: Positive for malaise/fatigue.  Respiratory: Negative.   Cardiovascular: Negative.   Gastrointestinal:  Positive for diarrhea, heartburn and nausea.  Neurological: Positive for headaches. Negative for tremors.  Psychiatric/Behavioral: Negative.     All other systems reviewed and are negative.   Labs/Other Tests and Data Reviewed:    Recent Labs: 02/12/2020: Hemoglobin 12.9; Platelets 384 08/17/2020: ALT 15; BUN 15; Creatinine, Ser 0.98; Potassium 4.3; Sodium 138   Recent Lipid Panel Lab Results  Component Value Date/Time   CHOL 195 08/17/2020 02:58 PM   TRIG 141 08/17/2020 02:58 PM   HDL 53 08/17/2020 02:58 PM   CHOLHDL 3.7 08/17/2020 02:58 PM   LDLCALC 117 (H) 08/17/2020 02:58 PM    Wt  Readings from Last 3 Encounters:  01/20/21 202 lb 6.4 oz (91.8 kg)  08/17/20 200 lb (90.7 kg)  02/12/20 200 lb (90.7 kg)     Exam:    Vital Signs:  Ht 5\' 7"  (1.702 m)   Wt 202 lb 6.4 oz (91.8 kg)   BMI 31.70 kg/m     Physical Exam Vitals reviewed.  Constitutional:      General: She is not in acute distress.    Appearance: She is obese.  Pulmonary:     Effort: Pulmonary effort is normal. No respiratory distress.  Neurological:     Mental Status: She is oriented to person, place, and time.     Cranial Nerves: No cranial nerve deficit.  Psychiatric:        Mood and Affect: Mood normal.        Behavior: Behavior normal.        Thought Content: Thought content normal.        Judgment: Judgment normal.     ASSESSMENT & PLAN:    1. Diarrhea, unspecified type  She is advised to eat a bland diet and to drink gatorade to help with electrolyte replacement  She will hold her blood pressure medication for couple days while having diarrhea   She will come tomorrow for a PCR covid test and to pick up stool cards.  - Ova and parasite examination - Clostridium difficile Toxin A/B - Culture, Stool - Novel Coronavirus, NAA ([facility])  2. Nausea  Intermittent, she is not interested in any medications for nausea at this time   COVID-19 Education: The signs and symptoms of COVID-19 were discussed with the patient and how to seek care for testing (follow up with PCP or arrange E-visit).  The importance of social distancing was discussed today.  Patient Risk:   After full review of this patients clinical status, I feel that they are at least moderate risk at this time.  Time:   Today, I have spent 12 minutes/ seconds with the patient with telehealth technology discussing above diagnoses.     Medication Adjustments/Labs and Tests Ordered: Current medicines are reviewed at length with the patient today.  Concerns regarding medicines are outlined above.   Tests Ordered: Orders  Placed This Encounter  Procedures  . Ova and parasite examination  . Clostridium difficile Toxin A/B  . Culture, Stool  . Novel Coronavirus, NAA ([facility])    Medication Changes: No orders of the defined types were placed in this encounter.   Disposition:  Follow up prn  Signed, , FNP

## 2021-01-21 DIAGNOSIS — R197 Diarrhea, unspecified: Secondary | ICD-10-CM | POA: Diagnosis not present

## 2021-01-22 LAB — NOVEL CORONAVIRUS, NAA: SARS-CoV-2, NAA: NOT DETECTED

## 2021-01-22 LAB — SARS-COV-2, NAA 2 DAY TAT

## 2021-01-25 DIAGNOSIS — R197 Diarrhea, unspecified: Secondary | ICD-10-CM | POA: Diagnosis not present

## 2021-01-27 LAB — CLOSTRIDIUM DIFFICILE EIA: C difficile Toxins A+B, EIA: NEGATIVE

## 2021-01-28 LAB — OVA AND PARASITE EXAMINATION

## 2021-01-29 LAB — STOOL CULTURE: E coli, Shiga toxin Assay: NEGATIVE

## 2021-02-04 ENCOUNTER — Other Ambulatory Visit: Payer: Self-pay | Admitting: Nurse Practitioner

## 2021-02-04 DIAGNOSIS — F5102 Adjustment insomnia: Secondary | ICD-10-CM

## 2021-02-04 DIAGNOSIS — F4321 Adjustment disorder with depressed mood: Secondary | ICD-10-CM

## 2021-02-04 NOTE — Telephone Encounter (Signed)
Trazadone last appt September

## 2021-02-17 ENCOUNTER — Ambulatory Visit: Payer: Medicare PPO | Admitting: Nurse Practitioner

## 2021-02-17 ENCOUNTER — Encounter: Payer: Self-pay | Admitting: Nurse Practitioner

## 2021-02-17 ENCOUNTER — Other Ambulatory Visit: Payer: Self-pay

## 2021-02-17 VITALS — BP 150/80 | HR 82 | Temp 98.2°F | Ht 67.0 in | Wt 206.0 lb

## 2021-02-17 DIAGNOSIS — E782 Mixed hyperlipidemia: Secondary | ICD-10-CM

## 2021-02-17 DIAGNOSIS — I10 Essential (primary) hypertension: Secondary | ICD-10-CM

## 2021-02-17 DIAGNOSIS — Z0000 Encounter for general adult medical examination without abnormal findings: Secondary | ICD-10-CM | POA: Diagnosis not present

## 2021-02-17 DIAGNOSIS — H9193 Unspecified hearing loss, bilateral: Secondary | ICD-10-CM

## 2021-02-17 DIAGNOSIS — H6123 Impacted cerumen, bilateral: Secondary | ICD-10-CM | POA: Diagnosis not present

## 2021-02-17 DIAGNOSIS — M25471 Effusion, right ankle: Secondary | ICD-10-CM | POA: Diagnosis not present

## 2021-02-17 LAB — POCT URINALYSIS DIPSTICK
Bilirubin, UA: NEGATIVE
Blood, UA: NEGATIVE
Glucose, UA: NEGATIVE
Ketones, UA: NEGATIVE
Nitrite, UA: NEGATIVE
Protein, UA: NEGATIVE
Spec Grav, UA: 1.02 (ref 1.010–1.025)
Urobilinogen, UA: 0.2 E.U./dL
pH, UA: 7.5 (ref 5.0–8.0)

## 2021-02-17 LAB — POCT UA - MICROALBUMIN
Albumin/Creatinine Ratio, Urine, POC: 30
Creatinine, POC: 50 mg/dL
Microalbumin Ur, POC: 10 mg/L

## 2021-02-17 NOTE — Patient Instructions (Signed)
Health Maintenance, Female Adopting a healthy lifestyle and getting preventive care are important in promoting health and wellness. Ask your health care provider about:  The right schedule for you to have regular tests and exams.  Things you can do on your own to prevent diseases and keep yourself healthy. What should I know about diet, weight, and exercise? Eat a healthy diet  Eat a diet that includes plenty of vegetables, fruits, low-fat dairy products, and lean protein.  Do not eat a lot of foods that are high in solid fats, added sugars, or sodium.   Maintain a healthy weight Body mass index (BMI) is used to identify weight problems. It estimates body fat based on height and weight. Your health care provider can help determine your BMI and help you achieve or maintain a healthy weight. Get regular exercise Get regular exercise. This is one of the most important things you can do for your health. Most adults should:  Exercise for at least 150 minutes each week. The exercise should increase your heart rate and make you sweat (moderate-intensity exercise).  Do strengthening exercises at least twice a week. This is in addition to the moderate-intensity exercise.  Spend less time sitting. Even light physical activity can be beneficial. Watch cholesterol and blood lipids Have your blood tested for lipids and cholesterol at 70 years of age, then have this test every 5 years. Have your cholesterol levels checked more often if:  Your lipid or cholesterol levels are high.  You are older than 70 years of age.  You are at high risk for heart disease. What should I know about cancer screening? Depending on your health history and family history, you may need to have cancer screening at various ages. This may include screening for:  Breast cancer.  Cervical cancer.  Colorectal cancer.  Skin cancer.  Lung cancer. What should I know about heart disease, diabetes, and high blood  pressure? Blood pressure and heart disease  High blood pressure causes heart disease and increases the risk of stroke. This is more likely to develop in people who have high blood pressure readings, are of African descent, or are overweight.  Have your blood pressure checked: ? Every 3-5 years if you are 18-39 years of age. ? Every year if you are 40 years old or older. Diabetes Have regular diabetes screenings. This checks your fasting blood sugar level. Have the screening done:  Once every three years after age 40 if you are at a normal weight and have a low risk for diabetes.  More often and at a younger age if you are overweight or have a high risk for diabetes. What should I know about preventing infection? Hepatitis B If you have a higher risk for hepatitis B, you should be screened for this virus. Talk with your health care provider to find out if you are at risk for hepatitis B infection. Hepatitis C Testing is recommended for:  Everyone born from 1945 through 1965.  Anyone with known risk factors for hepatitis C. Sexually transmitted infections (STIs)  Get screened for STIs, including gonorrhea and chlamydia, if: ? You are sexually active and are younger than 70 years of age. ? You are older than 70 years of age and your health care provider tells you that you are at risk for this type of infection. ? Your sexual activity has changed since you were last screened, and you are at increased risk for chlamydia or gonorrhea. Ask your health care provider   if you are at risk.  Ask your health care provider about whether you are at high risk for HIV. Your health care provider may recommend a prescription medicine to help prevent HIV infection. If you choose to take medicine to prevent HIV, you should first get tested for HIV. You should then be tested every 3 months for as long as you are taking the medicine. Pregnancy  If you are about to stop having your period (premenopausal) and  you may become pregnant, seek counseling before you get pregnant.  Take 400 to 800 micrograms (mcg) of folic acid every day if you become pregnant.  Ask for birth control (contraception) if you want to prevent pregnancy. Osteoporosis and menopause Osteoporosis is a disease in which the bones lose minerals and strength with aging. This can result in bone fractures. If you are 65 years old or older, or if you are at risk for osteoporosis and fractures, ask your health care provider if you should:  Be screened for bone loss.  Take a calcium or vitamin D supplement to lower your risk of fractures.  Be given hormone replacement therapy (HRT) to treat symptoms of menopause. Follow these instructions at home: Lifestyle  Do not use any products that contain nicotine or tobacco, such as cigarettes, e-cigarettes, and chewing tobacco. If you need help quitting, ask your health care provider.  Do not use street drugs.  Do not share needles.  Ask your health care provider for help if you need support or information about quitting drugs. Alcohol use  Do not drink alcohol if: ? Your health care provider tells you not to drink. ? You are pregnant, may be pregnant, or are planning to become pregnant.  If you drink alcohol: ? Limit how much you use to 0-1 drink a day. ? Limit intake if you are breastfeeding.  Be aware of how much alcohol is in your drink. In the [place] one drink equals one 12 oz bottle of beer (355 mL), one 5 oz glass of wine (148 mL), or one 1 oz glass of hard liquor (44 mL). General instructions  Schedule regular health, dental, and eye exams.  Stay current with your vaccines.  Tell your health care provider if: ? You often feel depressed. ? You have ever been abused or do not feel safe at home. Summary  Adopting a healthy lifestyle and getting preventive care are important in promoting health and wellness.  Follow your health care provider's instructions about healthy  diet, exercising, and getting tested or screened for diseases.  Follow your health care provider's instructions on monitoring your cholesterol and blood pressure. This information is not intended to replace advice given to you by your health care provider. Make sure you discuss any questions you have with your health care provider. Document Revised: 11/07/2018 Document Reviewed: 11/07/2018 Elsevier Patient Education  2021 Elsevier Inc.  

## 2021-02-17 NOTE — Progress Notes (Signed)
I,Yamilka Roman [employer] as a Education administrator for [employer], FNP.,have documented all relevant documentation on the behalf of [name], FNP,as directed by  [name], FNP while in the presence of [name], [place]. This visit occurred during the SARS-CoV-2 public health emergency.  Safety protocols were in place, including screening questions prior to the visit, additional usage of staff PPE, and extensive cleaning of exam room while observing appropriate contact time as indicated for disinfecting solutions.  Subjective:     Patient ID: [name] , female    DOB: 1950/12/22 , 70 y.o.   MRN: [id number]   Chief Complaint  Patient presents with  . Annual Exam    HPI  Patient here for hm  Wt Readings from Last 3 Encounters: 02/17/21 : 206 lb (93.4 kg) 01/20/21 : 202 lb 6.4 oz (91.8 kg) 08/17/20 : 200 lb (90.7 kg)    Past Medical History:  Diagnosis Date  . Arthritis   . Constipation   . GERD (gastroesophageal reflux disease)   . Hypertension      Family History  Problem Relation Age of Onset  . Diabetes Mother   . Heart attack Father   . Cancer Brother   . Anesthesia problems Neg Hx      Current Outpatient Medications:  .  alendronate (FOSAMAX) 70 MG tablet, Take 1 tablet (70 mg total) by mouth once a week. Take with a full glass of water on an empty stomach., Disp: 12 tablet, Rfl: 2 .  aspirin 81 MG tablet, Take 81 mg by mouth daily., Disp: , Rfl:  .  dexlansoprazole (DEXILANT) 60 MG capsule, Take 60 mg by mouth daily., Disp: , Rfl:  .  lisinopril-hydrochlorothiazide (ZESTORETIC) 20-12.5 MG tablet, TAKE 1 TABLET BY MOUTH EVERY DAY, Disp: 90 tablet, Rfl: 1 .  simvastatin (ZOCOR) 40 MG tablet, TAKE 1 TABLET BY MOUTH EVERY DAY, Disp: 90 tablet, Rfl: 1 .  traZODone (DESYREL) 50 MG tablet, TAKE 1 TABLET BY MOUTH EVERY DAY AS NEEDED FOR SLEEP, Disp: 30 tablet, Rfl: 2   Allergies  Allergen Reactions  . Penicillins Hives      The patient states she is status post  hysterectomy . Negative for: breast discharge, breast lump(s), breast pain and breast self exam. Associated symptoms include abnormal vaginal bleeding. Pertinent negatives include abnormal bleeding (hematology), anxiety, decreased libido, depression, difficulty falling sleep, dyspareunia, history of infertility, nocturia, sexual dysfunction, sleep disturbances, urinary incontinence, urinary urgency, vaginal discharge and vaginal itching. Diet regular. She is looking to do a keto diet. The patient states her exercise level is none. She plans to start back exercising.   The patient's tobacco use is:  Social History   Tobacco Use  Smoking Status Never Smoker  Smokeless Tobacco Never Used   She has been exposed to passive smoke. The patient's alcohol use is:  Social History   Substance and Sexual Activity  Alcohol Use No    Review of Systems  Constitutional: Negative.   HENT: Negative.   Eyes: Negative.   Respiratory: Negative.   Cardiovascular: Negative.   Gastrointestinal: Negative.   Endocrine: Negative.   Genitourinary: Negative.   Musculoskeletal: Negative.   Skin: Negative.   Allergic/Immunologic: Negative.   Neurological: Negative.   Hematological: Negative.   Psychiatric/Behavioral: Negative.      Today's Vitals   02/17/21 1417  BP: (!) 150/80  Pulse: 82  Temp: 98.2 F (36.8 C)  Weight: 206 lb (93.4 kg)  Height: $Remove'5\' 7"'hDNaLao$  (1.702 m)  PainSc: 0-No pain  Body mass index is 32.26 kg/m.   Objective:  Physical Exam Vitals reviewed.  Constitutional:      General: She is not in acute distress.    Appearance: Normal appearance.  Cardiovascular:     Rate and Rhythm: Normal rate and regular rhythm.     Pulses: Normal pulses.     Heart sounds: Normal heart sounds. No murmur heard.   Pulmonary:     Effort: Pulmonary effort is normal. No respiratory distress.     Breath sounds: Normal breath sounds.  Skin:    Capillary Refill: Capillary refill takes less than 2  seconds.  Neurological:     General: No focal deficit present.     Mental Status: She is alert and oriented to person, place, and time.     Cranial Nerves: No cranial nerve deficit.  Psychiatric:        Mood and Affect: Mood normal.        Behavior: Behavior normal.        Thought Content: Thought content normal.        Judgment: Judgment normal.         Assessment And Plan:     1. Encounter for general adult medical examination w/o abnormal findings . Behavior modifications discussed and diet history reviewed.   . Pt will continue to exercise regularly and modify diet with low GI, plant based foods and decrease intake of processed foods.  . Recommend intake of daily multivitamin, Vitamin D, and calcium.  . Recommend mammogram and colonoscopy (both are up to date) for preventive screenings, as well as recommend immunizations that include influenza, TDAP  2. Mixed hyperlipidemia  Chronic, stable.   She is tolerating statin well - Lipid panel  3. Essential hypertension  Chronic, blood pressure is slightly elevated  She is encouraged to continue a low salt diet - POCT Urinalysis Dipstick (81002) - POCT UA - Microalbumin - EKG 12-Lead - CMP14+EGFR - CBC  4. Right ankle swelling  She has been having intermittent swelling to her right ankle  Will check uric acid due to history of hypertension - Uric acid  5. Bilateral hearing loss, unspecified hearing loss type  Wax is removed by with lavage with elephant ear with 1/2 water and 1/2 peroxide. Instructions for home care to prevent wax buildup are given. - Ear Lavage     Patient was given opportunity to ask questions. Patient verbalized understanding of the plan and was able to repeat key elements of the plan. All questions were answered to their satisfaction.   [name], FNP   I, [name], FNP, have reviewed all documentation for this visit. The documentation on 02/17/21 for the exam, diagnosis, procedures,  and orders are all accurate and complete.   THE PATIENT IS ENCOURAGED TO PRACTICE SOCIAL DISTANCING DUE TO THE COVID-19 PANDEMIC.

## 2021-02-18 LAB — LIPID PANEL
Chol/HDL Ratio: 3.5 ratio (ref 0.0–4.4)
Cholesterol, Total: 195 mg/dL (ref 100–199)
HDL: 55 mg/dL (ref >39)
LDL Chol Calc (NIH): 119 mg/dL — ABNORMAL HIGH (ref 0–99)
Triglycerides: 117 mg/dL (ref 0–149)
VLDL Cholesterol Cal: 21 mg/dL (ref 5–40)

## 2021-02-18 LAB — CBC
Hematocrit: 37 % (ref 34.0–46.6)
Hemoglobin: 12 g/dL (ref 11.1–15.9)
MCH: 29.4 pg (ref 26.6–33.0)
MCHC: 32.4 g/dL (ref 31.5–35.7)
MCV: 91 fL (ref 79–97)
Platelets: 362 10*3/uL (ref 150–450)
RBC: 4.08 x10E6/uL (ref 3.77–5.28)
RDW: 12.9 % (ref 11.7–15.4)
WBC: 6.3 10*3/uL (ref 3.4–10.8)

## 2021-02-18 LAB — CMP14+EGFR
ALT: 13 IU/L (ref 0–32)
AST: 22 IU/L (ref 0–40)
Albumin/Globulin Ratio: 1.6 (ref 1.2–2.2)
Albumin: 4.5 g/dL (ref 3.8–4.8)
Alkaline Phosphatase: 81 IU/L (ref 44–121)
BUN/Creatinine Ratio: 17 (ref 12–28)
BUN: 19 mg/dL (ref 8–27)
Bilirubin Total: 0.4 mg/dL (ref 0.0–1.2)
CO2: 23 mmol/L (ref 20–29)
Calcium: 9.6 mg/dL (ref 8.7–10.3)
Chloride: 98 mmol/L (ref 96–106)
Creatinine, Ser: 1.14 mg/dL — ABNORMAL HIGH (ref 0.57–1.00)
Globulin, Total: 2.8 g/dL (ref 1.5–4.5)
Glucose: 75 mg/dL (ref 65–99)
Potassium: 4.2 mmol/L (ref 3.5–5.2)
Sodium: 140 mmol/L (ref 134–144)
Total Protein: 7.3 g/dL (ref 6.0–8.5)
eGFR: 52 mL/min/{1.73_m2} — ABNORMAL LOW (ref >59)

## 2021-02-18 LAB — URIC ACID: Uric Acid: 6.2 mg/dL (ref 3.0–7.2)

## 2021-03-17 ENCOUNTER — Other Ambulatory Visit: Payer: Self-pay

## 2021-03-17 ENCOUNTER — Encounter: Payer: Self-pay | Admitting: Nurse Practitioner

## 2021-03-17 ENCOUNTER — Ambulatory Visit: Payer: Medicare PPO | Admitting: Nurse Practitioner

## 2021-03-17 ENCOUNTER — Ambulatory Visit (INDEPENDENT_AMBULATORY_CARE_PROVIDER_SITE_OTHER): Payer: Medicare PPO

## 2021-03-17 VITALS — BP 122/76 | HR 76 | Temp 98.4°F | Ht 67.0 in | Wt 205.0 lb

## 2021-03-17 DIAGNOSIS — Z7189 Other specified counseling: Secondary | ICD-10-CM | POA: Diagnosis not present

## 2021-03-17 DIAGNOSIS — R7303 Prediabetes: Secondary | ICD-10-CM | POA: Diagnosis not present

## 2021-03-17 DIAGNOSIS — I10 Essential (primary) hypertension: Secondary | ICD-10-CM | POA: Diagnosis not present

## 2021-03-17 DIAGNOSIS — Z0000 Encounter for general adult medical examination without abnormal findings: Secondary | ICD-10-CM | POA: Diagnosis not present

## 2021-03-17 NOTE — Progress Notes (Signed)
This visit occurred during the SARS-CoV-2 public health emergency.  Safety protocols were in place, including screening questions prior to the visit, additional usage of staff PPE, and extensive cleaning of exam room while observing appropriate contact time as indicated for disinfecting solutions.  Subjective:   [name] is a 70 y.o. female who presents for Medicare Annual (Subsequent) preventive examination.  Review of Systems     Cardiac Risk Factors include: advanced age (>47men, >22 women);dyslipidemia;hypertension;obesity (BMI >30kg/m2);sedentary lifestyle     Objective:    Today's Vitals   03/17/21 1544 03/17/21 1558  BP: 122/76   Pulse: 76   Temp: 98.4 F (36.9 C)   TempSrc: Oral   Weight: 205 lb (93 kg)   Height: 5\' 7"  (1.702 m)   PainSc:  3    Body mass index is 32.11 kg/m.  Advanced Directives 03/17/2021 02/12/2020 01/23/2019 02/08/2012 02/08/2012 01/31/2012  Does Patient Have a Medical Advance Directive? No No No Patient does not have advance directive;Patient would not like information Patient does not have advance directive;Patient would not like information -  Would patient like information on creating a medical advance directive? No - Patient declined No - Patient declined Yes (MAU/Ambulatory/Procedural Areas - Information given) - - -  Pre-existing out of facility DNR order (yellow form or pink MOST form) - - - No - No    Current Medications (verified) Outpatient Encounter Medications as of 03/17/2021  Medication Sig  . alendronate (FOSAMAX) 70 MG tablet Take 1 tablet (70 mg total) by mouth once a week. Take with a full glass of water on an empty stomach.  03/19/2021 aspirin 81 MG tablet Take 81 mg by mouth daily.  Marland Kitchen dexlansoprazole (DEXILANT) 60 MG capsule Take 60 mg by mouth daily.  Marland Kitchen lisinopril-hydrochlorothiazide (ZESTORETIC) 20-12.5 MG tablet TAKE 1 TABLET BY MOUTH EVERY DAY  . simvastatin (ZOCOR) 40 MG tablet TAKE 1 TABLET BY MOUTH EVERY DAY   No  facility-administered encounter medications on file as of 03/17/2021.    Allergies (verified) Penicillins   History: Past Medical History:  Diagnosis Date  . Arthritis   . Constipation   . GERD (gastroesophageal reflux disease)   . Hypertension    Past Surgical History:  Procedure Laterality Date  . ABDOMINAL HYSTERECTOMY    . BREAST SURGERY     bio  neg cancer  . EYE SURGERY  2019   bilateral cataract surgery   Family History  Problem Relation Age of Onset  . Diabetes Mother   . Heart attack Father   . Cancer Brother   . Anesthesia problems Neg Hx    Social History   Socioeconomic History  . Marital status: Married    Spouse name: Not on file  . Number of children: Not on file  . Years of education: Not on file  . Highest education level: Not on file  Occupational History  . Occupation: retired  Tobacco Use  . Smoking status: Never Smoker  . Smokeless tobacco: Never Used  Vaping Use  . Vaping Use: Never used  Substance and Sexual Activity  . Alcohol use: No  . Drug use: No  . Sexual activity: Not Currently  Other Topics Concern  . Not on file  Social History Narrative  . Not on file   Social Determinants of Health   Financial Resource Strain: Low Risk   . Difficulty of Paying Living Expenses: Not hard at all  Food Insecurity: No Food Insecurity  . Worried About 2020 of  Food in the Last Year: Never true  . Ran Out of Food in the Last Year: Never true  Transportation Needs: No Transportation Needs  . Lack of Transportation (Medical): No  . Lack of Transportation (Non-Medical): No  Physical Activity: Inactive  . Days of Exercise per Week: 0 days  . Minutes of Exercise per Session: 0 min  Stress: No Stress Concern Present  . Feeling of Stress : Not at all  Social Connections: Not on file    Tobacco Counseling Counseling given: Not Answered   Clinical Intake:  Pre-visit preparation completed: Yes  Pain : 0-10 Pain Score: 3  Pain Type:  Chronic pain Pain Location: Shoulder Pain Orientation: Left Pain Descriptors / Indicators: Aching Pain Onset: More than a month ago Pain Frequency: Intermittent     Nutritional Status: BMI > 30  Obese Nutritional Risks: None Diabetes: No  How often do you need to have someone help you when you read instructions, pamphlets, or other written materials from your doctor or pharmacy?: 1 - Never What is the last grade level you completed in school?: 12th grade  Diabetic? no  Interpreter Needed?: No  Information entered by :: NAllen LPN   Activities of Daily Living In your present state of health, do you have any difficulty performing the following activities: 03/17/2021 03/17/2021  Hearing? [name]  Vision? N N  Difficulty concentrating or making decisions? N N  Walking or climbing stairs? N N  Dressing or bathing? N N  Doing errands, shopping? N N  Preparing Food and eating ? N -  Using the Toilet? N -  In the past six months, have you accidently leaked urine? N -  Do you have problems with loss of bowel control? N -  Managing your Finances? N -  Housekeeping or managing your Housekeeping? N -  Some recent data might be hidden    Patient Care Team: Arnette Felts, FNP as PCP - General (General Practice) Poudyal, Ritesh, OD (Optometry)  Indicate any recent Medical Services you may have received from other than [facility] providers in the past year (date may be approximate).     Assessment:   This is a routine wellness examination for [name].  Hearing/Vision screen No exam data present  Dietary issues and exercise activities discussed: Current Exercise Habits: The patient does not participate in regular exercise at present  Goals    .  Patient Stated      02/12/2020, wants to start exercising and eating healthier    .  Patient Stated      [facility]17/2021[facility]    .  Weight (lb) < 200 lb (90.7 kg) (pt-stated)      Wants to lose 5 pounds      Depression  Screen PHQ 2/9 Scores 03/17/2021 02/17/2021 02/12/2020 02/12/2020 07/24/2019 03/07/2019 01/23/2019  PHQ - 2 Score 0 1 0 0 0 1 0  PHQ- 9 Score - - 2 - - 9 1    Fall Risk Fall Risk  03/17/2021 03/17/2021 02/12/2020 02/12/2020 07/24/2019  Falls in the past year? 0 0 0 0 0  Number falls in past yr: - - - - -  Injury with Fall? - - - - -  Risk for fall due to : Medication side effect - Medication side effect - -  Follow up Falls evaluation completed;Education provided;Falls prevention discussed - Falls evaluation completed;Education provided;Falls prevention discussed - -    FALL RISK PREVENTION PERTAINING TO THE HOME:  Any stairs in or around the home? No  If so, are there any without handrails? n/a Home free of loose throw rugs in walkways, pet beds, electrical cords, etc? Yes  Adequate lighting in your home to reduce risk of falls? Yes   ASSISTIVE DEVICES UTILIZED TO PREVENT FALLS:  Life alert? No  Use of a cane, walker or w/c? No  Grab bars in the bathroom? No  Shower chair or bench in shower? No  Elevated toilet seat or a handicapped toilet? Yes   TIMED UP AND GO:  Was the test performed? No . .   Gait steady and fast without use of assistive device  Cognitive Function:     6CIT Screen 03/17/2021 02/12/2020 01/23/2019  What Year? 0 points 0 points 0 points  What month? 0 points 0 points 0 points  What time? 0 points 0 points 0 points  Count back from 20 0 points 0 points 0 points  Months in reverse 0 points 0 points 0 points  Repeat phrase 4 points 0 points 0 points  Total Score 4 0 0    Immunizations Immunization History  Administered Date(s) Administered  . Fluad Quad(high Dose 65+) 08/17/2020  . Influenza, High Dose Seasonal PF 08/18/2018, 07/24/2019  . PFIZER(Purple Top)SARS-COV-2 Vaccination 01/12/2020, 02/04/2020, 09/03/2020, 03/11/2021    TDAP status: Up to date  Flu Vaccine status: Up to date  Pneumococcal vaccine status: Due, Education has been provided regarding  the importance of this vaccine. Advised may receive this vaccine at local pharmacy or Health Dept. Aware to provide a copy of the vaccination record if obtained from local pharmacy or Health Dept. Verbalized acceptance and understanding.  Covid-19 vaccine status: Completed vaccines  Qualifies for Shingles Vaccine? Yes   Zostavax completed No   Shingrix Completed?: No.    Education has been provided regarding the importance of this vaccine. Patient has been advised to call insurance company to determine out of pocket expense if they have not yet received this vaccine. Advised may also receive vaccine at local pharmacy or Health Dept. Verbalized acceptance and understanding.  Screening Tests Health Maintenance  Topic Date Due  . PNA vac Low Risk Adult (1 of 2 - PCV13) Never done  . INFLUENZA VACCINE  06/28/2021  . MAMMOGRAM  01/12/2022  . COLONOSCOPY (Pts 45-2550yrs Insurance coverage will need to be confirmed)  01/19/2022  . TETANUS/TDAP  10/30/2022  . DEXA SCAN  Completed  . COVID-19 Vaccine  Completed  . Hepatitis C Screening  Completed  . HPV VACCINES  Aged Out    Health Maintenance  Health Maintenance Due  Topic Date Due  . PNA vac Low Risk Adult (1 of 2 - PCV13) Never done    Colorectal cancer screening: Type of screening: Colonoscopy. Completed 01/20/2012. Repeat every 10 years  Mammogram status: Completed 01/12/2021. Repeat every year  Bone Density status: Completed 06/15/2016.   Lung Cancer Screening: (Low Dose CT Chest recommended if Age 36-80 years, 30 pack-year currently smoking OR have quit w/in 15years.) does not qualify.   Lung Cancer Screening Referral: no  Additional Screening:  Hepatitis C Screening: does qualify; Completed 01/23/2019  Vision Screening: Recommended annual ophthalmology exams for early detection of glaucoma and other disorders of the eye. Is the patient up to date with their annual eye exam?  Yes  Who is the provider or what is the name of the  office in which the patient attends annual eye exams? Dr. Laurice RecordPudal If pt is not established with a  provider, would they like to be referred to a provider to establish care? No .   Dental Screening: Recommended annual dental exams for proper oral hygiene  Community Resource Referral / Chronic Care Management: CRR required this visit?  No   CCM required this visit?  No      Plan:     I have personally reviewed and noted the following in the patient's chart:   . Medical and social history . Use of alcohol, tobacco or illicit drugs  . Current medications and supplements . Functional ability and status . Nutritional status . Physical activity . Advanced directives . List of other physicians . Hospitalizations, surgeries, and ER visits in previous 12 months . Vitals . Screenings to include cognitive, depression, and falls . Referrals and appointments  In addition, I have reviewed and discussed with patient certain preventive protocols, quality metrics, and best practice recommendations. A written personalized care plan for preventive services as well as general preventive health recommendations were provided to patient.     Barb Merino, LPN   [id number]   Nurse Notes:

## 2021-03-17 NOTE — Patient Instructions (Signed)
[name] , Thank you for taking time to come for your Medicare Wellness Visit. I appreciate your ongoing commitment to your health goals. Please review the following plan we discussed and let me know if I can assist you in the future.   Screening recommendations/referrals: Colonoscopy: completed 01/20/2012 Mammogram: completed 01/12/2021 Bone Density: completed 06/15/2016 Recommended yearly ophthalmology/optometry visit for glaucoma screening and checkup Recommended yearly dental visit for hygiene and checkup  Vaccinations: Influenza vaccine: completed 08/17/2020 Pneumococcal vaccine: due Tdap vaccine: completed 10/30/2012, due 10/30/2022 Shingles vaccine: discussed   Covid-19: 03/11/2021, 02/04/2020, 01/12/2020  Advanced directives: Advance directive discussed with you today. Even though you declined this today please call our office should you change your mind and we can give you the proper paperwork for you to fill out.  Conditions/risks identified: none  Next appointment: Follow up in one year for your annual wellness visit    Preventive Care 65 Years and Older, Female Preventive care refers to lifestyle choices and visits with your health care provider that can promote health and wellness. What does preventive care include?  A yearly physical exam. This is also called an annual well check.  Dental exams once or twice a year.  Routine eye exams. Ask your health care provider how often you should have your eyes checked.  Personal lifestyle choices, including:  Daily care of your teeth and gums.  Regular physical activity.  Eating a healthy diet.  Avoiding tobacco and drug use.  Limiting alcohol use.  Practicing safe sex.  Taking low-dose aspirin every day.  Taking vitamin and mineral supplements as recommended by your health care provider. What happens during an annual well check? The services and screenings done by your health care provider during your annual well  check will depend on your age, overall health, lifestyle risk factors, and family history of disease. Counseling  Your health care provider may ask you questions about your:  Alcohol use.  Tobacco use.  Drug use.  Emotional well-being.  Home and relationship well-being.  Sexual activity.  Eating habits.  History of falls.  Memory and ability to understand (cognition).  Work and work Astronomer.  Reproductive health. Screening  You may have the following tests or measurements:  Height, weight, and BMI.  Blood pressure.  Lipid and cholesterol levels. These may be checked every 5 years, or more frequently if you are over 56 years old.  Skin check.  Lung cancer screening. You may have this screening every year starting at age 28 if you have a 30-pack-year history of smoking and currently smoke or have quit within the past 15 years.  Fecal occult blood test (FOBT) of the stool. You may have this test every year starting at age 60.  Flexible sigmoidoscopy or colonoscopy. You may have a sigmoidoscopy every 5 years or a colonoscopy every 10 years starting at age 76.  Hepatitis C blood test.  Hepatitis B blood test.  Sexually transmitted disease (STD) testing.  Diabetes screening. This is done by checking your blood sugar (glucose) after you have not eaten for a while (fasting). You may have this done every 1-3 years.  Bone density scan. This is done to screen for osteoporosis. You may have this done starting at age 67.  Mammogram. This may be done every 1-2 years. Talk to your health care provider about how often you should have regular mammograms. Talk with your health care provider about your test results, treatment options, and if necessary, the need for more tests. Vaccines  Your health care provider may recommend certain vaccines, such as:  Influenza vaccine. This is recommended every year.  Tetanus, diphtheria, and acellular pertussis (Tdap, Td) vaccine. You  may need a Td booster every 10 years.  Zoster vaccine. You may need this after age 71.  Pneumococcal 13-valent conjugate (PCV13) vaccine. One dose is recommended after age 69.  Pneumococcal polysaccharide (PPSV23) vaccine. One dose is recommended after age 29. Talk to your health care provider about which screenings and vaccines you need and how often you need them. This information is not intended to replace advice given to you by your health care provider. Make sure you discuss any questions you have with your health care provider. Document Released: 12/11/2015 Document Revised: 08/03/2016 Document Reviewed: 09/15/2015 Elsevier Interactive Patient Education  2017 [employer] Prevention in the Home Falls can cause injuries. They can happen to people of all ages. There are many things you can do to make your home safe and to help prevent falls. What can I do on the outside of my home?  Regularly fix the edges of walkways and driveways and fix any cracks.  Remove anything that might make you trip as you walk through a door, such as a raised step or threshold.  Trim any bushes or trees on the path to your home.  Use bright outdoor lighting.  Clear any walking paths of anything that might make someone trip, such as rocks or tools.  Regularly check to see if handrails are loose or broken. Make sure that both sides of any steps have handrails.  Any raised decks and porches should have guardrails on the edges.  Have any leaves, snow, or ice cleared regularly.  Use sand or salt on walking paths during winter.  Clean up any spills in your garage right away. This includes oil or grease spills. What can I do in the bathroom?  Use night lights.  Install grab bars by the toilet and in the tub and shower. Do not use towel bars as grab bars.  Use non-skid mats or decals in the tub or shower.  If you need to sit down in the shower, use a plastic, non-slip stool.  Keep the floor  dry. Clean up any water that spills on the floor as soon as it happens.  Remove soap buildup in the tub or shower regularly.  Attach bath mats securely with double-sided non-slip rug tape.  Do not have throw rugs and other things on the floor that can make you trip. What can I do in the bedroom?  Use night lights.  Make sure that you have a light by your bed that is easy to reach.  Do not use any sheets or blankets that are too big for your bed. They should not hang down onto the floor.  Have a firm chair that has side arms. You can use this for support while you get dressed.  Do not have throw rugs and other things on the floor that can make you trip. What can I do in the kitchen?  Clean up any spills right away.  Avoid walking on wet floors.  Keep items that you use a lot in easy-to-reach places.  If you need to reach something above you, use a strong step stool that has a grab bar.  Keep electrical cords out of the way.  Do not use floor polish or wax that makes floors slippery. If you must use wax, use non-skid floor wax.  Do  not have throw rugs and other things on the floor that can make you trip. What can I do with my stairs?  Do not leave any items on the stairs.  Make sure that there are handrails on both sides of the stairs and use them. Fix handrails that are broken or loose. Make sure that handrails are as long as the stairways.  Check any carpeting to make sure that it is firmly attached to the stairs. Fix any carpet that is loose or worn.  Avoid having throw rugs at the top or bottom of the stairs. If you do have throw rugs, attach them to the floor with carpet tape.  Make sure that you have a light switch at the top of the stairs and the bottom of the stairs. If you do not have them, ask someone to add them for you. What else can I do to help prevent falls?  Wear shoes that:  Do not have high heels.  Have rubber bottoms.  Are comfortable and fit you  well.  Are closed at the toe. Do not wear sandals.  If you use a stepladder:  Make sure that it is fully opened. Do not climb a closed stepladder.  Make sure that both sides of the stepladder are locked into place.  Ask someone to hold it for you, if possible.  Clearly mark and make sure that you can see:  Any grab bars or handrails.  First and last steps.  Where the edge of each step is.  Use tools that help you move around (mobility aids) if they are needed. These include:  Canes.  Walkers.  Scooters.  Crutches.  Turn on the lights when you go into a dark area. Replace any light bulbs as soon as they burn out.  Set up your furniture so you have a clear path. Avoid moving your furniture around.  If any of your floors are uneven, fix them.  If there are any pets around you, be aware of where they are.  Review your medicines with your doctor. Some medicines can make you feel dizzy. This can increase your chance of falling. Ask your doctor what other things that you can do to help prevent falls. This information is not intended to replace advice given to you by your health care provider. Make sure you discuss any questions you have with your health care provider. Document Released: 09/10/2009 Document Revised: 04/21/2016 Document Reviewed: 12/19/2014 Elsevier Interactive Patient Education  2017 [employer].

## 2021-03-17 NOTE — Progress Notes (Signed)
Tomasa Hose as a scribe for Arnette Felts, FNP.,have documented all relevant documentation on the behalf of Arnette Felts, FNP,as directed by  Arnette Felts, FNP while in the presence of Arnette Felts, FNP. This visit occurred during the SARS-CoV-2 public health emergency.  Safety protocols were in place, including screening questions prior to the visit, additional usage of staff PPE, and extensive cleaning of exam room while observing appropriate contact time as indicated for disinfecting solutions.  Subjective:     Patient ID: [name] , female    DOB: [birth year] , 70 y.o.   MRN: [id number]   Chief Complaint  Patient presents with  . Hypertension    HPI  Pt here today for b/p follow up. She would like to try lipo b injection for more energy, weight loss and mental clarity.  She has cut back on the carbohydrates and sugary foods. She has been working on this for the last month  Wt Readings from Last 3 Encounters: 03/17/21 : 205 lb (93 kg) 02/17/21 : 206 lb (93.4 kg) 01/20/21 : 202 lb 6.4 oz (91.8 kg)   Hypertension This is a chronic problem. The current episode started more than 1 year ago. The problem is unchanged. The problem is controlled. Pertinent negatives include no anxiety, chest pain, headaches or palpitations. There are no associated agents to hypertension. Risk factors for coronary artery disease include obesity and sedentary lifestyle. Past treatments include diuretics and ACE inhibitors. The current treatment provides significant improvement. There are no compliance problems.  There is no history of angina. There is no history of chronic renal disease.  Insomnia Primary symptoms: sleep disturbance.  The onset quality is gradual. The problem is unchanged. PMH includes: associated symptoms present.     Past Medical History:  Diagnosis Date  . Arthritis   . Constipation   . GERD (gastroesophageal reflux disease)   . Hypertension      Family History   Problem Relation Age of Onset  . Diabetes Mother   . Heart attack Father   . Cancer Brother   . Anesthesia problems Neg Hx      Current Outpatient Medications:  .  alendronate (FOSAMAX) 70 MG tablet, Take 1 tablet (70 mg total) by mouth once a week. Take with a full glass of water on an empty stomach., Disp: 12 tablet, Rfl: 2 .  aspirin 81 MG tablet, Take 81 mg by mouth daily., Disp: , Rfl:  .  dexlansoprazole (DEXILANT) 60 MG capsule, Take 60 mg by mouth daily., Disp: , Rfl:  .  lisinopril-hydrochlorothiazide (ZESTORETIC) 20-12.5 MG tablet, TAKE 1 TABLET BY MOUTH EVERY DAY, Disp: 90 tablet, Rfl: 1 .  simvastatin (ZOCOR) 40 MG tablet, TAKE 1 TABLET BY MOUTH EVERY DAY, Disp: 90 tablet, Rfl: 1   Allergies  Allergen Reactions  . Penicillins Hives     Review of Systems  Constitutional: Negative.  Negative for fatigue.  HENT: Negative.   Respiratory: Negative.   Cardiovascular: Negative.  Negative for chest pain, palpitations and leg swelling.  Endocrine: Negative for polydipsia, polyphagia and polyuria.  Musculoskeletal: Negative.   Skin: Negative.   Neurological: Negative for dizziness and headaches.  Psychiatric/Behavioral: Positive for sleep disturbance. The patient has insomnia.      Today's Vitals   03/17/21 1535  BP: 122/76  Pulse: 76  Temp: 98.4 F (36.9 C)  TempSrc: Oral  Weight: 205 lb (93 kg)  Height: 5\' 7"  (1.702 m)   Body mass index is 32.11 kg/m.  Wt  Readings from Last 3 Encounters:  03/17/21 205 lb (93 kg)  03/17/21 205 lb (93 kg)  02/17/21 206 lb (93.4 kg)   Objective:  Physical Exam Vitals reviewed.  Constitutional:      General: She is not in acute distress.    Appearance: Normal appearance. She is normal weight.  Cardiovascular:     Rate and Rhythm: Normal rate and regular rhythm.     Pulses: Normal pulses.     Heart sounds: Normal heart sounds. No murmur heard.   Pulmonary:     Effort: Pulmonary effort is normal. No respiratory distress.      Breath sounds: Normal breath sounds. No wheezing.  Neurological:     General: No focal deficit present.     Mental Status: She is alert and oriented to person, place, and time.     Cranial Nerves: No cranial nerve deficit.  Psychiatric:        Mood and Affect: Mood normal.        Behavior: Behavior normal.        Thought Content: Thought content normal.        Judgment: Judgment normal.         Assessment And Plan:     1. Essential hypertension . B/P is controlled.  . CMP ordered to check renal function.  . The importance of regular exercise and dietary modification was stressed to the patient.  . No labs this visit she had done in March  2. Prediabetes  Chronic, controlled  Continue with current medications  Encouraged to limit intake of sugary foods and drinks  Encouraged to increase physical activity to 150 minutes per week as tolerated  3. Advanced care planning/counseling discussion  I talked with her about advanced directives to explain what it is and how she can get this done    Patient was given opportunity to ask questions. Patient verbalized understanding of the plan and was able to repeat key elements of the plan. All questions were answered to their satisfaction.  Arnette Felts, FNP   I, Arnette Felts, FNP, have reviewed all documentation for this visit. The documentation on 03/17/21 for the exam, diagnosis, procedures, and orders are all accurate and complete.   IF YOU HAVE BEEN REFERRED TO A SPECIALIST, IT MAY TAKE 1-2 WEEKS TO SCHEDULE/PROCESS THE REFERRAL. IF YOU HAVE NOT HEARD FROM US/SPECIALIST IN TWO WEEKS, PLEASE GIVE [place] A CALL AT [phone].   THE PATIENT IS ENCOURAGED TO PRACTICE SOCIAL DISTANCING DUE TO THE COVID-19 PANDEMIC.

## 2021-03-17 NOTE — Patient Instructions (Addendum)
Hypertension, Adult Hypertension is another name for high blood pressure. High blood pressure forces your heart to work harder to pump blood. This can cause problems over time. There are two numbers in a blood pressure reading. There is a top number (systolic) over a bottom number (diastolic). It is best to have a blood pressure that is below 120/80. Healthy choices can help lower your blood pressure, or you may need medicine to help lower it. What are the causes? The cause of this condition is not known. Some conditions may be related to high blood pressure. What increases the risk?  Smoking.  Having type 2 diabetes mellitus, high cholesterol, or both.  Not getting enough exercise or physical activity.  Being overweight.  Having too much fat, sugar, calories, or salt (sodium) in your diet.  Drinking too much alcohol.  Having long-term (chronic) kidney disease.  Having a family history of high blood pressure.  Age. Risk increases with age.  Race. You may be at higher risk if you are African American.  Gender. Men are at higher risk than women before age 45. After age 65, women are at higher risk than men.  Having obstructive sleep apnea.  Stress. What are the signs or symptoms?  High blood pressure may not cause symptoms. Very high blood pressure (hypertensive crisis) may cause: ? Headache. ? Feelings of worry or nervousness (anxiety). ? Shortness of breath. ? Nosebleed. ? A feeling of being sick to your stomach (nausea). ? Throwing up (vomiting). ? Changes in how you see. ? Very bad chest pain. ? Seizures. How is this treated?  This condition is treated by making healthy lifestyle changes, such as: ? Eating healthy foods. ? Exercising more. ? Drinking less alcohol.  Your health care provider may prescribe medicine if lifestyle changes are not enough to get your blood pressure under control, and if: ? Your top number is above 130. ? Your bottom number is above  80.  Your personal target blood pressure may vary. Follow these instructions at home: Eating and drinking  If told, follow the DASH eating plan. To follow this plan: ? Fill one half of your plate at each meal with fruits and vegetables. ? Fill one fourth of your plate at each meal with whole grains. Whole grains include whole-wheat pasta, brown rice, and whole-grain bread. ? Eat or drink low-fat dairy products, such as skim milk or low-fat yogurt. ? Fill one fourth of your plate at each meal with low-fat (lean) proteins. Low-fat proteins include fish, chicken without skin, eggs, beans, and tofu. ? Avoid fatty meat, cured and processed meat, or chicken with skin. ? Avoid pre-made or processed food.  Eat less than 1,500 mg of salt each day.  Do not drink alcohol if: ? Your doctor tells you not to drink. ? You are pregnant, may be pregnant, or are planning to become pregnant.  If you drink alcohol: ? Limit how much you use to:  0-1 drink a day for women.  0-2 drinks a day for men. ? Be aware of how much alcohol is in your drink. In the [place] one drink equals one 12 oz bottle of beer (355 mL), one 5 oz glass of wine (148 mL), or one 1 oz glass of hard liquor (44 mL).   Lifestyle  Work with your doctor to stay at a healthy weight or to lose weight. Ask your doctor what the best weight is for you.  Get at least 30 minutes of exercise most   days of the week. This may include walking, swimming, or biking.  Get at least 30 minutes of exercise that strengthens your muscles (resistance exercise) at least 3 days a week. This may include lifting weights or doing Pilates.  Do not use any products that contain nicotine or tobacco, such as cigarettes, e-cigarettes, and chewing tobacco. If you need help quitting, ask your doctor.  Check your blood pressure at home as told by your doctor.  Keep all follow-up visits as told by your doctor. This is important.   Medicines  Take over-the-counter  and prescription medicines only as told by your doctor. Follow directions carefully.  Do not skip doses of blood pressure medicine. The medicine does not work as well if you skip doses. Skipping doses also puts you at risk for problems.  Ask your doctor about side effects or reactions to medicines that you should watch for. Contact a doctor if you:  Think you are having a reaction to the medicine you are taking.  Have headaches that keep coming back (recurring).  Feel dizzy.  Have swelling in your ankles.  Have trouble with your vision. Get help right away if you:  Get a very bad headache.  Start to feel mixed up (confused).  Feel weak or numb.  Feel faint.  Have very bad pain in your: ? Chest. ? Belly (abdomen).  Throw up more than once.  Have trouble breathing. Summary  Hypertension is another name for high blood pressure.  High blood pressure forces your heart to work harder to pump blood.  For most people, a normal blood pressure is less than 120/80.  Making healthy choices can help lower blood pressure. If your blood pressure does not get lower with healthy choices, you may need to take medicine. This information is not intended to replace advice given to you by your health care provider. Make sure you discuss any questions you have with your health care provider. Document Revised: 07/25/2018 Document Reviewed: 07/25/2018 Elsevier Patient Education  2021 [employer]      Critical care medicine: Principles of diagnosis and management in the adult (4th ed., pp. 9476-5465). Saunders."> Miller's anesthesia (8th ed., pp. 232-250). Saunders.">  Advance Directive  Advance directives are legal documents that allow you to make decisions about your health care and medical treatment in case you become unable to communicate for yourself. Advance directives let your wishes be known to family, friends, and health care providers. Discussing and writing advance  directives should happen over time rather than all at once. Advance directives can be changed and updated at any time. There are different types of advance directives, such as:  Medical power of attorney.  Living will.  Do not resuscitate (DNR) order or do not attempt resuscitation (DNAR) order. Health care proxy and medical power of attorney A health care proxy is also called a health care agent. This person is appointed to make medical decisions for you when you are unable to make decisions for yourself. Generally, people ask a trusted friend or family member to act as their proxy and represent their preferences. Make sure you have an agreement with your trusted person to act as your proxy. A proxy may have to make a medical decision on your behalf if your wishes are not known. A medical power of attorney, also called a durable power of attorney for health care, is a legal document that names your health care proxy. Depending on the laws in your state, the document may need  to be:  Signed.  Notarized.  Dated.  Copied.  Witnessed.  Incorporated into your medical record. You may also want to appoint a trusted person to manage your money in the event you are unable to do so. This is called a durable power of attorney for finances. It is a separate legal document from the durable power of attorney for health care. You may choose your health care proxy or someone different to act as your agent in money matters. If you do not appoint a proxy, or there is a concern that the proxy is not acting in your best interest, a court may appoint a guardian to act on your behalf. Living will A living will is a set of instructions that state your wishes about medical care when you cannot express them yourself. Health care providers should keep a copy of your living will in your medical record. You may want to give a copy to family members or friends. To alert caregivers in case of an emergency, you can place  a card in your wallet to let them know that you have a living will and where they can find it. A living will is used if you become:  Terminally ill.  Disabled.  Unable to communicate or make decisions. The following decisions should be included in your living will:  To use or not to use life support equipment, such as dialysis machines and breathing machines (ventilators).  Whether you want a DNR or DNAR order. This tells health care providers not to use cardiopulmonary resuscitation (CPR) if breathing or heartbeat stops.  To use or not to use tube feeding.  To be given or not to be given food and fluids.  Whether you want comfort (palliative) care when the goal becomes comfort rather than a cure.  Whether you want to donate your organs and tissues. A living will does not give instructions for distributing your money and property if you should pass away. DNR or DNAR A DNR or DNAR order is a request not to have CPR in the event that your heart stops beating or you stop breathing. If a DNR or DNAR order has not been made and shared, a health care provider will try to help any patient whose heart has stopped or who has stopped breathing. If you plan to have surgery, talk with your health care provider about how your DNR or DNAR order will be followed if problems occur. What if I do not have an advance directive? Some states assign family decision makers to act on your behalf if you do not have an advance directive. Each state has its own laws about advance directives. You may want to check with your health care provider, attorney, or state representative about the laws in your state. Summary  Advance directives are legal documents that allow you to make decisions about your health care and medical treatment in case you become unable to communicate for yourself.  The process of discussing and writing advance directives should happen over time. You can change and update advance directives at  any time.  Advance directives may include a medical power of attorney, a living will, and a DNR or DNAR order. This information is not intended to replace advice given to you by your health care provider. Make sure you discuss any questions you have with your health care provider. Document Revised: 08/18/2020 Document Reviewed: 08/18/2020 Elsevier Patient Education  2021 [employer].

## 2021-03-24 ENCOUNTER — Ambulatory Visit: Payer: Medicare PPO | Admitting: Nurse Practitioner

## 2021-04-16 DIAGNOSIS — M545 Low back pain, unspecified: Secondary | ICD-10-CM | POA: Diagnosis not present

## 2021-05-14 DIAGNOSIS — M545 Low back pain, unspecified: Secondary | ICD-10-CM | POA: Diagnosis not present

## 2021-05-21 DIAGNOSIS — S335XXD Sprain of ligaments of lumbar spine, subsequent encounter: Secondary | ICD-10-CM | POA: Diagnosis not present

## 2021-05-26 DIAGNOSIS — S335XXD Sprain of ligaments of lumbar spine, subsequent encounter: Secondary | ICD-10-CM | POA: Diagnosis not present

## 2021-06-02 DIAGNOSIS — S335XXD Sprain of ligaments of lumbar spine, subsequent encounter: Secondary | ICD-10-CM | POA: Diagnosis not present

## 2021-06-04 DIAGNOSIS — S335XXD Sprain of ligaments of lumbar spine, subsequent encounter: Secondary | ICD-10-CM | POA: Diagnosis not present

## 2021-06-08 DIAGNOSIS — S335XXD Sprain of ligaments of lumbar spine, subsequent encounter: Secondary | ICD-10-CM | POA: Diagnosis not present

## 2021-06-11 DIAGNOSIS — S335XXD Sprain of ligaments of lumbar spine, subsequent encounter: Secondary | ICD-10-CM | POA: Diagnosis not present

## 2021-06-14 DIAGNOSIS — S335XXD Sprain of ligaments of lumbar spine, subsequent encounter: Secondary | ICD-10-CM | POA: Diagnosis not present

## 2021-06-16 ENCOUNTER — Ambulatory Visit (INDEPENDENT_AMBULATORY_CARE_PROVIDER_SITE_OTHER): Payer: Medicare PPO

## 2021-06-16 ENCOUNTER — Encounter (HOSPITAL_COMMUNITY): Payer: Self-pay

## 2021-06-16 ENCOUNTER — Other Ambulatory Visit: Payer: Self-pay

## 2021-06-16 ENCOUNTER — Ambulatory Visit (HOSPITAL_COMMUNITY)
Admission: EM | Admit: 2021-06-16 | Discharge: 2021-06-16 | Disposition: A | Discharge status: Home / Self Care | Payer: Medicare PPO | Attending: Internal Medicine | Admitting: Internal Medicine

## 2021-06-16 DIAGNOSIS — M545 Low back pain, unspecified: Secondary | ICD-10-CM | POA: Diagnosis not present

## 2021-06-16 DIAGNOSIS — M546 Pain in thoracic spine: Secondary | ICD-10-CM

## 2021-06-16 DIAGNOSIS — M5432 Sciatica, left side: Secondary | ICD-10-CM | POA: Diagnosis not present

## 2021-06-16 DIAGNOSIS — G8929 Other chronic pain: Secondary | ICD-10-CM | POA: Diagnosis not present

## 2021-06-16 DIAGNOSIS — M5431 Sciatica, right side: Secondary | ICD-10-CM | POA: Diagnosis not present

## 2021-06-16 MED ORDER — KETOROLAC TROMETHAMINE 30 MG/ML IJ SOLN
INTRAMUSCULAR | Status: AC
  Filled 2021-06-16: qty 1

## 2021-06-16 MED ORDER — PREDNISONE 20 MG PO TABS: 40.0000 mg | ORAL_TABLET | Freq: Every day | ORAL | 0 refills | Status: DC

## 2021-06-16 MED ORDER — KETOROLAC TROMETHAMINE 30 MG/ML IJ SOLN
30.0000 mg | Freq: Once | INTRAMUSCULAR | Status: AC
  Administered 2021-06-16: 30 mg via INTRAMUSCULAR

## 2021-06-16 NOTE — Discharge Instructions (Addendum)
Your x-rays were negative for any acute abnormality including fracture or dislocation.  You are given ketorolac injection in office today to decrease pain and inflammation.  Please start taking prednisone tomorrow due to receiving ketorolac injection in urgent care today.  Please do not take any over-the-counter Advil, ibuprofen, Aleve while taking prednisone.  Please alternate ice and heat application to affected area of pain.  Please call [facility] today to set up appointment for further evaluation and management.  If not able to be seen by [facility] soon, please call [facility] provided contact information to be evaluated by them.

## 2021-06-16 NOTE — ED Triage Notes (Signed)
Pt presents with chronic back pain; pt states she has been doing physical therapy X 1 month but has been having more severe back pain X 4 days.

## 2021-06-16 NOTE — ED Provider Notes (Signed)
[facility]    CSN: [id number] Arrival date & time: 06/16/21  6301      History   Chief Complaint Chief Complaint  Patient presents with   Back Pain    HPI [name] is a 70 y.o. female.   Patient presents for evaluation of 4-day history of severe back pain.  Patient states that she does have chronic back pain but thinks that she exacerbated it approximately 4 days ago when she bent over to pick something up.  Was seen by orthopedics about a month ago where she was prescribed muscle relaxers.  Has been taking methocarbamol and Flexeril as needed for back pain with no relief of back pain.  Was also prescribed prednisone by PCP approximately 2 months ago for chronic back pain.  [facility] ordered physical therapy about 3 weeks ago where she was showing signs of improvement in back pain until about 4 days ago when she bent over to pick something up.  Denies any urinary burning or frequency.  Denies any urinary or bowel incontinence.  Has also taken over-the-counter Advil and Tylenol without any relief of pain.  Has also used ice application.  Pain is exacerbated with any movement.   Back Pain  Past Medical History:  Diagnosis Date   Arthritis    Constipation    GERD (gastroesophageal reflux disease)    Hypertension     Patient Active Problem List   Diagnosis Date Noted   Adjustment insomnia 08/17/2020   Grief 08/17/2020   Mixed hyperlipidemia 07/24/2019   Prediabetes 07/24/2019   Gastroesophageal reflux disease without esophagitis 10/15/2018   Essential hypertension 10/15/2018    Past Surgical History:  Procedure Laterality Date   ABDOMINAL HYSTERECTOMY     BREAST SURGERY     bio  neg cancer   EYE SURGERY  2019   bilateral cataract surgery    OB History   No obstetric history on file.      Home Medications    Prior to Admission medications   Medication Sig Start Date End Date Taking? Authorizing Provider  predniSONE (DELTASONE) 20 MG  tablet Take 2 tablets (40 mg total) by mouth daily for 5 days. 06/16/21 06/21/21 Yes Lance Muss, FNP  alendronate (FOSAMAX) 70 MG tablet Take 1 tablet (70 mg total) by mouth once a week. Take with a full glass of water on an empty stomach. 08/17/20   Arnette Felts, FNP  aspirin 81 MG tablet Take 81 mg by mouth daily.    [provider]  dexlansoprazole (DEXILANT) 60 MG capsule Take 60 mg by mouth daily.    [provider]  lisinopril-hydrochlorothiazide (ZESTORETIC) 20-12.5 MG tablet TAKE 1 TABLET BY MOUTH EVERY DAY 01/05/21   Arnette Felts, FNP  simvastatin (ZOCOR) 40 MG tablet TAKE 1 TABLET BY MOUTH EVERY DAY 01/05/21   Arnette Felts, FNP    Family History Family History  Problem Relation Age of Onset   Diabetes Mother    Heart attack Father    Cancer Brother    Anesthesia problems Neg Hx     Social History Social History   Tobacco Use   Smoking status: Never   Smokeless tobacco: Never  Vaping Use   Vaping Use: Never used  Substance Use Topics   Alcohol use: No   Drug use: No     Allergies   Penicillins   Review of Systems Review of Systems  Musculoskeletal:  Positive for back pain.  Per HPI  Physical Exam  Triage Vital Signs ED Triage Vitals  Enc Vitals Group     BP 06/16/21 1024 125/79     Pulse Rate 06/16/21 1024 77     Resp 06/16/21 1024 17     Temp 06/16/21 1024 98.4 F (36.9 C)     Temp Source 06/16/21 1024 Oral     SpO2 06/16/21 1024 97 %     Weight --      Height --      Head Circumference --      Peak Flow --      Pain Score 06/16/21 1025 10     Pain Loc --      Pain Edu? --      Excl. in GC? --    No data found.  Updated Vital Signs BP 125/79 (BP Location: Left Arm)   Pulse 77   Temp 98.4 F (36.9 C) (Oral)   Resp 17   SpO2 97%   Visual Acuity Right Eye Distance:   Left Eye Distance:   Bilateral Distance:    Right Eye Near:   Left Eye Near:    Bilateral Near:     Physical Exam Constitutional:      General: She  is not in acute distress.    Appearance: Normal appearance.  HENT:     Head: Normocephalic and atraumatic.  Eyes:     Extraocular Movements: Extraocular movements intact.     Conjunctiva/sclera: Conjunctivae normal.  Pulmonary:     Effort: Pulmonary effort is normal. No respiratory distress.  Musculoskeletal:     Cervical back: Normal. No tenderness.     Thoracic back: Normal. No tenderness.     Lumbar back: Normal. No tenderness.     Comments: No tenderness to palpation throughout back.  Patient states that tenderness occurs with movement only.  Patient is unable to lie flat to test for straight leg raise.  Skin:    General: Skin is warm and dry.  Neurological:     General: No focal deficit present.     Mental Status: She is alert and oriented to person, place, and time. Mental status is at baseline.     Deep Tendon Reflexes: Reflexes are normal and symmetric.  Psychiatric:        Mood and Affect: Mood normal.        Behavior: Behavior normal.        Thought Content: Thought content normal.        Judgment: Judgment normal.     UC Treatments / Results  Labs (all labs ordered are listed, but only abnormal results are displayed) Labs Reviewed - No data to display  EKG   Radiology DG Thoracic Spine 2 View  Result Date: 06/16/2021 CLINICAL DATA:  Chronic back pain EXAM: THORACIC SPINE 2 VIEWS COMPARISON:  01/31/2012 FINDINGS: Vertebral body height is well maintained. Mild osteophytic changes are seen. No paraspinal mass lesion is noted. No pedicle abnormality is noted. Visualize ribcage is within normal limits. IMPRESSION: Mild degenerative change without acute abnormality. Electronically Signed   By: Alcide Clever M.D.   On: 06/16/2021 12:00   DG Lumbar Spine Complete  Result Date: 06/16/2021 CLINICAL DATA:  Chronic back pain EXAM: LUMBAR SPINE - COMPLETE 4+ VIEW COMPARISON:  04/16/2021 FINDINGS: Postsurgical changes are noted at L4-5 with interbody fusion and posterior  fixation. Some mild anterolisthesis of L4 on L5 is noted stable from the prior exam. No pars defects are seen. No other anterolisthesis is noted. IMPRESSION: Postsurgical changes as  described stable from the prior study. No acute abnormality is noted. Electronically Signed   By: Alcide Clever M.D.   On: 06/16/2021 11:59    Procedures Procedures (including critical care time)  Medications Ordered in UC Medications  ketorolac (TORADOL) 30 MG/ML injection 30 mg (30 mg Intramuscular Given 06/16/21 1143)    Initial Impression / Assessment and Plan / UC Course  I have reviewed the triage vital signs and the nursing notes.  Pertinent labs & imaging results that were available during my care of the patient were reviewed by me and considered in my medical decision making (see chart for details).     Ketorolac injection administered in urgent care today to help decrease inflammation and pain.  Prednisone x5 days prescribed as well.  Patient advised to not take prednisone until tomorrow due to receiving ketorolac injection today in urgent care.  Patient advised to avoid any over-the-counter NSAIDs as well while taking prednisone.  May take Tylenol as needed for pain.  Patient to alternate ice and heat application.  Patient was advised x-ray was not warranted at this time but patient insisted on x-ray.  Thoracic and lumbar spine x-rays completed.  X-ray showing degenerative changes as well as postsurgical changes but no acute abnormalities.  Patient advised to follow-up with [facility] today for further evaluation and management.  Patient also given [facility] contact information in case she is not able to be seen by orthopedics soon.  Patient advised to go to the hospital if any bowel or bladder incontinence develop or if pain significantly worsens. Discussed strict return precautions. Patient verbalized understanding and is agreeable with plan.  Final Clinical Impressions(s) / UC Diagnoses   Final  diagnoses:  Bilateral sciatica  Acute bilateral thoracic back pain     Discharge Instructions      Your x-rays were negative for any acute abnormality including fracture or dislocation.  You are given ketorolac injection in office today to decrease pain and inflammation.  Please start taking prednisone tomorrow due to receiving ketorolac injection in urgent care today.  Please do not take any over-the-counter Advil, ibuprofen, Aleve while taking prednisone.  Please alternate ice and heat application to affected area of pain.  Please call [facility] today to set up appointment for further evaluation and management.  If not able to be seen by [facility] orthopedics soon, please call [facility] provided contact information to be evaluated by them.     ED Prescriptions     Medication Sig Dispense Auth. Provider   predniSONE (DELTASONE) 20 MG tablet Take 2 tablets (40 mg total) by mouth daily for 5 days. 10 tablet Lance Muss, FNP      PDMP not reviewed this encounter.   Lance Muss, FNP 06/16/21 1239

## 2021-06-18 ENCOUNTER — Encounter: Payer: Self-pay | Admitting: Nurse Practitioner

## 2021-06-18 DIAGNOSIS — M545 Low back pain, unspecified: Secondary | ICD-10-CM | POA: Diagnosis not present

## 2021-06-18 DIAGNOSIS — M5416 Radiculopathy, lumbar region: Secondary | ICD-10-CM | POA: Diagnosis not present

## 2021-07-04 ENCOUNTER — Other Ambulatory Visit: Payer: Self-pay | Admitting: Nurse Practitioner

## 2021-07-04 DIAGNOSIS — E782 Mixed hyperlipidemia: Secondary | ICD-10-CM

## 2021-07-04 DIAGNOSIS — I10 Essential (primary) hypertension: Secondary | ICD-10-CM

## 2021-07-09 DIAGNOSIS — M48061 Spinal stenosis, lumbar region without neurogenic claudication: Secondary | ICD-10-CM | POA: Diagnosis not present

## 2021-07-19 DIAGNOSIS — M48062 Spinal stenosis, lumbar region with neurogenic claudication: Secondary | ICD-10-CM | POA: Diagnosis not present

## 2021-07-25 ENCOUNTER — Other Ambulatory Visit: Payer: Self-pay | Admitting: Nurse Practitioner

## 2021-07-25 DIAGNOSIS — E2839 Other primary ovarian failure: Secondary | ICD-10-CM

## 2021-07-29 DIAGNOSIS — M48062 Spinal stenosis, lumbar region with neurogenic claudication: Secondary | ICD-10-CM | POA: Diagnosis not present

## 2021-08-20 DIAGNOSIS — M48062 Spinal stenosis, lumbar region with neurogenic claudication: Secondary | ICD-10-CM | POA: Diagnosis not present

## 2021-08-23 ENCOUNTER — Encounter: Payer: Self-pay | Admitting: Nurse Practitioner

## 2021-08-23 ENCOUNTER — Other Ambulatory Visit: Payer: Self-pay

## 2021-08-23 ENCOUNTER — Ambulatory Visit: Payer: Medicare PPO | Admitting: Nurse Practitioner

## 2021-08-23 VITALS — BP 120/62 | HR 79 | Temp 98.2°F | Ht 65.4 in | Wt 203.4 lb

## 2021-08-23 DIAGNOSIS — M5442 Lumbago with sciatica, left side: Secondary | ICD-10-CM

## 2021-08-23 DIAGNOSIS — M5441 Lumbago with sciatica, right side: Secondary | ICD-10-CM

## 2021-08-23 DIAGNOSIS — Z6833 Body mass index (BMI) 33.0-33.9, adult: Secondary | ICD-10-CM | POA: Diagnosis not present

## 2021-08-23 DIAGNOSIS — R7303 Prediabetes: Secondary | ICD-10-CM | POA: Diagnosis not present

## 2021-08-23 DIAGNOSIS — G8929 Other chronic pain: Secondary | ICD-10-CM

## 2021-08-23 DIAGNOSIS — E6609 Other obesity due to excess calories: Secondary | ICD-10-CM

## 2021-08-23 DIAGNOSIS — I10 Essential (primary) hypertension: Secondary | ICD-10-CM

## 2021-08-23 DIAGNOSIS — Z23 Encounter for immunization: Secondary | ICD-10-CM

## 2021-08-23 MED ORDER — ZOSTER VAC RECOMB ADJUVANTED 50 MCG/0.5ML IM SUSR: 0.5000 mL | Freq: Once | INTRAMUSCULAR | 1 refills | Status: AC

## 2021-08-23 NOTE — Progress Notes (Signed)
I,Tianna Badgett,acting as a Education administrator for [employer], FNP.,have documented all relevant documentation on the behalf of [name], FNP,as directed by  [name], FNP while in the presence of [name], [place].  This visit occurred during the SARS-CoV-2 public health emergency.  Safety protocols were in place, including screening questions prior to the visit, additional usage of staff PPE, and extensive cleaning of exam room while observing appropriate contact time as indicated for disinfecting solutions.  Subjective:     Patient ID: [name] , female    DOB: 1951-10-13 , 70 y.o.   MRN: [id number]   Chief Complaint  Patient presents with   Hypertension    HPI  Pt here today for b/p follow up.  She is being seen with Dr. Audree Bane at [place] for her back. She was told she had inflammation to her back and is taking meloxicam and gabapentin (last week). She has had an epidural injection. She will follow up in 5 weeks.   Wt Readings from Last 3 Encounters: 08/23/21 : 203 lb 6.4 oz (92.3 kg) 03/17/21 : 205 lb (93 kg) 03/17/21 : 205 lb (93 kg)    Hypertension This is a chronic problem. The current episode started more than 1 year ago. The problem is unchanged. The problem is controlled. Pertinent negatives include no anxiety, chest pain, headaches or palpitations. There are no associated agents to hypertension. Risk factors for coronary artery disease include obesity and sedentary lifestyle. Past treatments include diuretics and ACE inhibitors. The current treatment provides significant improvement. There are no compliance problems.  There is no history of angina. There is no history of chronic renal disease.    Past Medical History:  Diagnosis Date   Arthritis    Constipation    GERD (gastroesophageal reflux disease)    Hypertension      Family History  Problem Relation Age of Onset   Diabetes Mother    Heart attack Father    Cancer Brother    Anesthesia  problems Neg Hx      Current Outpatient Medications:    Zoster Vaccine Adjuvanted [facility]) injection, Inject 0.5 mLs into the muscle once for 1 dose. Administer 2nd dose in 2-6 months Please fax when each dose administered [phone], Disp: 0.5 mL, Rfl: 1   alendronate (FOSAMAX) 70 MG tablet, TAKE 1 TABLET (70 MG TOTAL) BY MOUTH ONCE A WEEK. TAKE WITH A FULL GLASS OF WATER ON AN EMPTY STOMACH., Disp: 12 tablet, Rfl: 2   aspirin 81 MG tablet, Take 81 mg by mouth daily., Disp: , Rfl:    dexlansoprazole (DEXILANT) 60 MG capsule, Take 60 mg by mouth daily., Disp: , Rfl:    gabapentin (NEURONTIN) 300 MG capsule, Take 300 mg by mouth at bedtime., Disp: , Rfl:    lisinopril-hydrochlorothiazide (ZESTORETIC) 20-12.5 MG tablet, TAKE 1 TABLET BY MOUTH EVERY DAY, Disp: 90 tablet, Rfl: 1   meloxicam (MOBIC) 15 MG tablet, Take 15 mg by mouth daily., Disp: , Rfl:    simvastatin (ZOCOR) 40 MG tablet, TAKE 1 TABLET BY MOUTH EVERY DAY, Disp: 90 tablet, Rfl: 1   Allergies  Allergen Reactions   Penicillins Hives     Review of Systems  Constitutional: Negative.   Respiratory: Negative.    Cardiovascular: Negative.  Negative for chest pain, palpitations and leg swelling.  Gastrointestinal: Negative.   Neurological: Negative.  Negative for dizziness and headaches.  Psychiatric/Behavioral:  The patient has insomnia.     Today's Vitals   08/23/21 1411  BP: 120/62  Pulse: 79  Temp: 98.2 F (36.8 C)  TempSrc: Oral  Weight: 203 lb 6.4 oz (92.3 kg)  Height: 5' 5.4" (1.661 m)   Body mass index is 33.43 kg/m.  Wt Readings from Last 3 Encounters:  08/23/21 203 lb 6.4 oz (92.3 kg)  03/17/21 205 lb (93 kg)  03/17/21 205 lb (93 kg)    Objective:  Physical Exam Vitals reviewed.  Constitutional:      General: She is not in acute distress.    Appearance: Normal appearance. She is obese.  Cardiovascular:     Rate and Rhythm: Normal rate and regular rhythm.     Pulses: Normal pulses.     Heart  sounds: Normal heart sounds. No murmur heard. Pulmonary:     Effort: Pulmonary effort is normal. No respiratory distress.     Breath sounds: Normal breath sounds. No wheezing.  Neurological:     General: No focal deficit present.     Mental Status: She is alert and oriented to person, place, and time.     Cranial Nerves: No cranial nerve deficit.  Psychiatric:        Mood and Affect: Mood normal.        Behavior: Behavior normal.        Thought Content: Thought content normal.        Judgment: Judgment normal.        Assessment And Plan:     1. Essential hypertension Comments: Blood pressure is well controlled, continue current medications - BMP8+eGFR  2. Prediabetes Comments: Stable, no current medications. Diet controlled - Lipid panel - BMP8+eGFR - Hemoglobin A1c  3. Need for influenza vaccination Influenza vaccine administered Encouraged to take Tylenol as needed for fever or muscle aches. - Flu Vaccine QUAD High Dose(Fluad)  4. Class 1 obesity due to excess calories with serious comorbidity and body mass index (BMI) of 33.0 to 33.9 in adult Chronic Discussed healthy diet and regular exercise options  Encouraged to exercise at least 150 minutes per week with 2 days of strength training as tolerated  5. Chronic bilateral low back pain with bilateral sciatica Continue follow up with Dr. Lynann Bologna, will request records  6. Encounter for immunization - Zoster Vaccine Adjuvanted [facility]) injection; Inject 0.5 mLs into the muscle once for 1 dose. Administer 2nd dose in 2-6 months Please fax when each dose administered [phone]  Dispense: 0.5 mL; Refill: 1  She is encouraged to strive for BMI less than 30 to decrease cardiac risk. Advised to aim for at least 150 minutes of exercise per week.    Patient was given opportunity to ask questions. Patient verbalized understanding of the plan and was able to repeat key elements of the plan. All questions were answered to  their satisfaction.  [name], FNP   I, [name], FNP, have reviewed all documentation for this visit. The documentation on 08/23/21 for the exam, diagnosis, procedures, and orders are all accurate and complete.   IF YOU HAVE BEEN REFERRED TO A SPECIALIST, IT MAY TAKE 1-2 WEEKS TO SCHEDULE/PROCESS THE REFERRAL. IF YOU HAVE NOT HEARD FROM US/SPECIALIST IN TWO WEEKS, PLEASE GIVE [place] A CALL AT [phone].   THE PATIENT IS ENCOURAGED TO PRACTICE SOCIAL DISTANCING DUE TO THE COVID-19 PANDEMIC.

## 2021-08-23 NOTE — Patient Instructions (Signed)

## 2021-08-24 LAB — BMP8+EGFR
BUN/Creatinine Ratio: 19 (ref 12–28)
BUN: 22 mg/dL (ref 8–27)
CO2: 24 mmol/L (ref 20–29)
Calcium: 9.2 mg/dL (ref 8.7–10.3)
Chloride: 100 mmol/L (ref 96–106)
Creatinine, Ser: 1.17 mg/dL — ABNORMAL HIGH (ref 0.57–1.00)
Glucose: 96 mg/dL (ref 70–99)
Potassium: 4.4 mmol/L (ref 3.5–5.2)
Sodium: 139 mmol/L (ref 134–144)
eGFR: 50 mL/min/{1.73_m2} — ABNORMAL LOW (ref >59)

## 2021-08-24 LAB — LIPID PANEL
Chol/HDL Ratio: 3.7 ratio (ref 0.0–4.4)
Cholesterol, Total: 207 mg/dL — ABNORMAL HIGH (ref 100–199)
HDL: 56 mg/dL (ref >39)
LDL Chol Calc (NIH): 119 mg/dL — ABNORMAL HIGH (ref 0–99)
Triglycerides: 183 mg/dL — ABNORMAL HIGH (ref 0–149)
VLDL Cholesterol Cal: 32 mg/dL (ref 5–40)

## 2021-08-24 LAB — HEMOGLOBIN A1C
Est. average glucose Bld gHb Est-mCnc: 120 mg/dL
Hgb A1c MFr Bld: 5.8 % — ABNORMAL HIGH (ref 4.8–5.6)

## 2021-10-01 DIAGNOSIS — M48062 Spinal stenosis, lumbar region with neurogenic claudication: Secondary | ICD-10-CM | POA: Diagnosis not present

## 2021-11-17 DIAGNOSIS — G629 Polyneuropathy, unspecified: Secondary | ICD-10-CM | POA: Diagnosis not present

## 2021-11-17 DIAGNOSIS — M810 Age-related osteoporosis without current pathological fracture: Secondary | ICD-10-CM | POA: Diagnosis not present

## 2021-11-17 DIAGNOSIS — E785 Hyperlipidemia, unspecified: Secondary | ICD-10-CM | POA: Diagnosis not present

## 2021-11-17 DIAGNOSIS — G8929 Other chronic pain: Secondary | ICD-10-CM | POA: Diagnosis not present

## 2021-11-17 DIAGNOSIS — M1990 Unspecified osteoarthritis, unspecified site: Secondary | ICD-10-CM | POA: Diagnosis not present

## 2021-11-17 DIAGNOSIS — E669 Obesity, unspecified: Secondary | ICD-10-CM | POA: Diagnosis not present

## 2021-11-17 DIAGNOSIS — I10 Essential (primary) hypertension: Secondary | ICD-10-CM | POA: Diagnosis not present

## 2021-11-17 DIAGNOSIS — M5430 Sciatica, unspecified side: Secondary | ICD-10-CM | POA: Diagnosis not present

## 2021-11-17 DIAGNOSIS — K219 Gastro-esophageal reflux disease without esophagitis: Secondary | ICD-10-CM | POA: Diagnosis not present

## 2021-12-02 ENCOUNTER — Ambulatory Visit: Payer: Medicare PPO | Admitting: Nurse Practitioner

## 2021-12-02 ENCOUNTER — Encounter: Payer: Self-pay | Admitting: Nurse Practitioner

## 2021-12-02 ENCOUNTER — Other Ambulatory Visit: Payer: Self-pay

## 2021-12-02 VITALS — BP 122/80 | HR 78 | Temp 98.1°F | Wt 203.4 lb

## 2021-12-02 DIAGNOSIS — E782 Mixed hyperlipidemia: Secondary | ICD-10-CM | POA: Diagnosis not present

## 2021-12-02 DIAGNOSIS — M25512 Pain in left shoulder: Secondary | ICD-10-CM | POA: Diagnosis not present

## 2021-12-02 DIAGNOSIS — M5441 Lumbago with sciatica, right side: Secondary | ICD-10-CM | POA: Diagnosis not present

## 2021-12-02 DIAGNOSIS — N183 Chronic kidney disease, stage 3 unspecified: Secondary | ICD-10-CM

## 2021-12-02 DIAGNOSIS — M542 Cervicalgia: Secondary | ICD-10-CM | POA: Diagnosis not present

## 2021-12-02 DIAGNOSIS — R7303 Prediabetes: Secondary | ICD-10-CM | POA: Diagnosis not present

## 2021-12-02 DIAGNOSIS — M25511 Pain in right shoulder: Secondary | ICD-10-CM | POA: Diagnosis not present

## 2021-12-02 DIAGNOSIS — M5442 Lumbago with sciatica, left side: Secondary | ICD-10-CM | POA: Diagnosis not present

## 2021-12-02 DIAGNOSIS — I129 Hypertensive chronic kidney disease with stage 1 through stage 4 chronic kidney disease, or unspecified chronic kidney disease: Secondary | ICD-10-CM

## 2021-12-02 DIAGNOSIS — Z6833 Body mass index (BMI) 33.0-33.9, adult: Secondary | ICD-10-CM

## 2021-12-02 DIAGNOSIS — G8929 Other chronic pain: Secondary | ICD-10-CM

## 2021-12-02 DIAGNOSIS — E6609 Other obesity due to excess calories: Secondary | ICD-10-CM

## 2021-12-02 NOTE — Progress Notes (Signed)
I,Yamilka J [place],acting as a Education administrator for [employer], FNP.,have documented all relevant documentation on the behalf of [name], FNP,as directed by  [name], FNP while in the presence of [name], [place].   This visit occurred during the SARS-CoV-2 public health emergency.  Safety protocols were in place, including screening questions prior to the visit, additional usage of staff PPE, and extensive cleaning of exam room while observing appropriate contact time as indicated for disinfecting solutions.  Subjective:     Patient ID: [name] , female    DOB: May 07, 1951 , 71 y.o.   MRN: [id number]   Chief Complaint  Patient presents with   Hypertension    HPI  Pt here today for b/p follow up. Patient reports having some back pain. She is currently taking gabapentin but it is not working for her it is making her sleepy.  She has been seeing Dr. Jacelyn Grip for her back pain. She is interested in seeing a chiropractor.   Wt Readings from Last 3 Encounters: 12/02/21 : 203 lb 6.4 oz (92.3 kg) 08/23/21 : 203 lb 6.4 oz (92.3 kg) 03/17/21 : 205 lb (93 kg)       Hypertension This is a chronic problem. The current episode started more than 1 year ago. The problem is unchanged. The problem is controlled. Pertinent negatives include no anxiety, chest pain, headaches or palpitations. There are no associated agents to hypertension. Risk factors for coronary artery disease include obesity and sedentary lifestyle. Past treatments include diuretics and ACE inhibitors. The current treatment provides significant improvement. There are no compliance problems.  There is no history of angina. There is no history of chronic renal disease.    Past Medical History:  Diagnosis Date   Arthritis    Constipation    GERD (gastroesophageal reflux disease)    Hypertension      Family History  Problem Relation Age of Onset   Diabetes Mother    Heart attack Father    Cancer Brother    Anesthesia  problems Neg Hx      Current Outpatient Medications:    alendronate (FOSAMAX) 70 MG tablet, TAKE 1 TABLET (70 MG TOTAL) BY MOUTH ONCE A WEEK. TAKE WITH A FULL GLASS OF WATER ON AN EMPTY STOMACH., Disp: 12 tablet, Rfl: 2   aspirin 81 MG tablet, Take 81 mg by mouth daily., Disp: , Rfl:    dexlansoprazole (DEXILANT) 60 MG capsule, Take 60 mg by mouth daily., Disp: , Rfl:    gabapentin (NEURONTIN) 300 MG capsule, Take 300 mg by mouth at bedtime., Disp: , Rfl:    lisinopril-hydrochlorothiazide (ZESTORETIC) 20-12.5 MG tablet, TAKE 1 TABLET BY MOUTH EVERY DAY, Disp: 90 tablet, Rfl: 1   meloxicam (MOBIC) 15 MG tablet, Take 15 mg by mouth daily., Disp: , Rfl:    simvastatin (ZOCOR) 40 MG tablet, TAKE 1 TABLET BY MOUTH EVERY DAY, Disp: 90 tablet, Rfl: 1   Allergies  Allergen Reactions   Penicillins Hives     Review of Systems  Constitutional: Negative.   Respiratory: Negative.    Cardiovascular: Negative.  Negative for chest pain, palpitations and leg swelling.  Musculoskeletal:  Positive for arthralgias and back pain.       Neck and bilateral shoulder with the left shoulder being worse.  Neurological: Negative.  Negative for dizziness and headaches.  Psychiatric/Behavioral: Negative.      Today's Vitals   12/02/21 1442  BP: 122/80  Pulse: 78  Temp: 98.1 F (36.7 C)  Weight: 203 lb 6.4 oz (92.3 kg)  PainSc: 0-No pain   Body mass index is 33.43 kg/m.   Objective:  Physical Exam Vitals reviewed.  Constitutional:      General: She is not in acute distress.    Appearance: Normal appearance. She is obese.  Cardiovascular:     Rate and Rhythm: Normal rate and regular rhythm.     Pulses: Normal pulses.     Heart sounds: Normal heart sounds. No murmur heard. Pulmonary:     Effort: Pulmonary effort is normal. No respiratory distress.     Breath sounds: Normal breath sounds. No wheezing.  Musculoskeletal:        General: Tenderness (bilateral shoulders with limited range of motion  with stiffness) present.     Comments: Neck tenderness to posterior neck with rotation right worse than left   Skin:    General: Skin is warm and dry.     Capillary Refill: Capillary refill takes less than 2 seconds.  Neurological:     General: No focal deficit present.     Mental Status: She is alert and oriented to person, place, and time.     Cranial Nerves: No cranial nerve deficit.     Motor: No weakness.  Psychiatric:        Mood and Affect: Mood normal.        Behavior: Behavior normal.        Thought Content: Thought content normal.        Judgment: Judgment normal.        Assessment And Plan:     1. Benign hypertension with CKD (chronic kidney disease) stage III (HCC) Comments: Blood pressure is well controlled, continue current medications. Discussed CKD - CMP14+EGFR  2. Prediabetes Comments: Stable, diet controlled.  - Hemoglobin A1c  3. Mixed hyperlipidemia Comments: Cholesterol levels are stable. Continue current medications - Lipid panel  4. Cervicalgia Comments: Mild tension to posterior neck, will refer to chiropractor - Ambulatory referral to Chiropractic  5. Chronic bilateral low back pain with bilateral sciatica Comments: Pain continues to worsen and ibuprofen not effective.  - Ambulatory referral to Chiropractic  6. Acute pain of both shoulders Comments: one month history of shoulder discomfort may be related to neck tension.   7. Class 1 obesity due to excess calories with serious comorbidity and body mass index (BMI) of 33.0 to 33.9 in adult Chronic Discussed healthy diet and regular exercise options  Encouraged to exercise at least 150 minutes per week with 2 days of strength training   Patient was given opportunity to ask questions. Patient verbalized understanding of the plan and was able to repeat key elements of the plan. All questions were answered to their satisfaction.  [name], FNP   I, [name], FNP, have reviewed all  documentation for this visit. The documentation on 12/03/21 for the exam, diagnosis, procedures, and orders are all accurate and complete.   IF YOU HAVE BEEN REFERRED TO A SPECIALIST, IT MAY TAKE 1-2 WEEKS TO SCHEDULE/PROCESS THE REFERRAL. IF YOU HAVE NOT HEARD FROM US/SPECIALIST IN TWO WEEKS, PLEASE GIVE [place] A CALL AT [phone] X [phone].   THE PATIENT IS ENCOURAGED TO PRACTICE SOCIAL DISTANCING DUE TO THE COVID-19 PANDEMIC.

## 2021-12-02 NOTE — Patient Instructions (Addendum)
Hypertension, Adult °Hypertension is another name for high blood pressure. High blood pressure forces your heart to work harder to pump blood. This can cause problems over time. °There are two numbers in a blood pressure reading. There is a top number (systolic) over a bottom number (diastolic). It is best to have a blood pressure that is below 120/80. Healthy choices can help lower your blood pressure, or you may need medicine to help lower it. °What are the causes? °The cause of this condition is not known. Some conditions may be related to high blood pressure. °What increases the risk? °Smoking. °Having type 2 diabetes mellitus, high cholesterol, or both. °Not getting enough exercise or physical activity. °Being overweight. °Having too much fat, sugar, calories, or salt (sodium) in your diet. °Drinking too much alcohol. °Having long-term (chronic) kidney disease. °Having a family history of high blood pressure. °Age. Risk increases with age. °Race. You may be at higher risk if you are African American. °Gender. Men are at higher risk than women before age 45. After age 65, women are at higher risk than men. °Having obstructive sleep apnea. °Stress. °What are the signs or symptoms? °High blood pressure may not cause symptoms. Very high blood pressure (hypertensive crisis) may cause: °Headache. °Feelings of worry or nervousness (anxiety). °Shortness of breath. °Nosebleed. °A feeling of being sick to your stomach (nausea). °Throwing up (vomiting). °Changes in how you see. °Very bad chest pain. °Seizures. °How is this treated? °This condition is treated by making healthy lifestyle changes, such as: °Eating healthy foods. °Exercising more. °Drinking less alcohol. °Your health care provider may prescribe medicine if lifestyle changes are not enough to get your blood pressure under control, and if: °Your top number is above 130. °Your bottom number is above 80. °Your personal target blood pressure may vary. °Follow  these instructions at home: °Eating and drinking ° °If told, follow the DASH eating plan. To follow this plan: °Fill one half of your plate at each meal with fruits and vegetables. °Fill one fourth of your plate at each meal with whole grains. Whole grains include whole-wheat pasta, brown rice, and whole-grain bread. °Eat or drink low-fat dairy products, such as skim milk or low-fat yogurt. °Fill one fourth of your plate at each meal with low-fat (lean) proteins. Low-fat proteins include fish, chicken without skin, eggs, beans, and tofu. °Avoid fatty meat, cured and processed meat, or chicken with skin. °Avoid pre-made or processed food. °Eat less than 1,500 mg of salt each day. °Do not drink alcohol if: °Your doctor tells you not to drink. °You are pregnant, may be pregnant, or are planning to become pregnant. °If you drink alcohol: °Limit how much you use to: °0-1 drink a day for women. °0-2 drinks a day for men. °Be aware of how much alcohol is in your drink. In the [place] one drink equals one 12 oz bottle of beer (355 mL), one 5 oz glass of wine (148 mL), or one 1½ oz glass of hard liquor (44 mL). °Lifestyle ° °Work with your doctor to stay at a healthy weight or to lose weight. Ask your doctor what the best weight is for you. °Get at least 30 minutes of exercise most days of the week. This may include walking, swimming, or biking. °Get at least 30 minutes of exercise that strengthens your muscles (resistance exercise) at least 3 days a week. This may include lifting weights or doing Pilates. °Do not use any products that contain nicotine or tobacco, such   as cigarettes, e-cigarettes, and chewing tobacco. If you need help quitting, ask your doctor. Check your blood pressure at home as told by your doctor. Keep all follow-up visits as told by your doctor. This is important. Medicines Take over-the-counter and prescription medicines only as told by your doctor. Follow directions carefully. Do not skip doses of  blood pressure medicine. The medicine does not work as well if you skip doses. Skipping doses also puts you at risk for problems. Ask your doctor about side effects or reactions to medicines that you should watch for. Contact a doctor if you: Think you are having a reaction to the medicine you are taking. Have headaches that keep coming back (recurring). Feel dizzy. Have swelling in your ankles. Have trouble with your vision. Get help right away if you: Get a very bad headache. Start to feel mixed up (confused). Feel weak or numb. Feel faint. Have very bad pain in your: Chest. Belly (abdomen). Throw up more than once. Have trouble breathing. Summary Hypertension is another name for high blood pressure. High blood pressure forces your heart to work harder to pump blood. For most people, a normal blood pressure is less than 120/80. Making healthy choices can help lower blood pressure. If your blood pressure does not get lower with healthy choices, you may need to take medicine. This information is not intended to replace advice given to you by your health care provider. Make sure you discuss any questions you have with your health care provider. Document Revised: 07/25/2018 Document Reviewed: 07/25/2018 Elsevier Patient Education  2022 [place].   Chronic Kidney Disease, Adult Chronic kidney disease is when lasting damage happens to the kidneys slowly over a long time. The kidneys help to: Make pee (urine). Make hormones. Keep the right amount of fluids and chemicals in the body. Most often, this disease does not go away. You must take steps to help keep the kidney damage from getting worse. If steps are not taken, the kidneys might stop working forever. What are the causes? Diabetes. High blood pressure. Diseases that affect the heart and blood vessels. Other kidney diseases. Diseases of the body's disease-fighting system. A problem with the flow of pee. Infections of the  organs that make pee, store it, and take it out of the body. Swelling or irritation of your blood vessels. What increases the risk? Getting older. Having someone in your family who has kidney disease or kidney failure. Having a disease caused by genes. Taking medicines often that harm the kidneys. Being near or having contact with harmful substances. Being very overweight. Using tobacco now or in the past. What are the signs or symptoms? Feeling very tired. Having a swollen face, legs, ankles, or feet. Feeling like you may vomit or vomiting. Not feeling hungry. Being confused or not able to focus. Twitches and cramps in the leg muscles or other muscles. Dry, itchy skin. A taste of metal in your mouth. Making less pee, or making more pee. Shortness of breath. Trouble sleeping. You may also become anemic or get weak bones. Anemic means there is not enough red blood cells or hemoglobin in your blood. You may get symptoms slowly. You may not notice them until the kidney damage gets very bad. How is this treated? Often, there is no cure for this disease. Treatment can help with symptoms and help keep the disease from getting worse. You may need to: Avoid alcohol. Avoid foods that are high in salt, potassium, phosphorous, and protein. Take medicines  for symptoms and to help control other conditions. Have dialysis. This treatment gets harmful waste out of your body. Treat other problems that cause your kidney disease or make it worse. Follow these instructions at home: Medicines Take over-the-counter and prescription medicines only as told by your doctor. Do not take any new medicines, vitamins, or supplements unless your doctor says it is okay. Lifestyle  Do not smoke or use any products that contain nicotine or tobacco. If you need help quitting, ask your doctor. If you drink alcohol: Limit how much you use to: 0-1 drink a day for women who are not pregnant. 0-2 drinks a day for  men. Know how much alcohol is in your drink. In the [place] one drink equals one 12 oz bottle of beer (355 mL), one 5 oz glass of wine (148 mL), or one 1 oz glass of hard liquor (44 mL). Stay at a healthy weight. If you need help losing weight, ask your doctor. General instructions  Follow instructions from your doctor about what you cannot eat or drink. Track your blood pressure at home. Tell your doctor about any changes. If you have diabetes, track your blood sugar. Exercise at least 30 minutes a day, 5 days a week. Keep your shots (vaccinations) up to date. Keep all follow-up visits. Where to find more information American Association of Kidney Patients: BombTimer.gl National Kidney Foundation: www.kidney.[place]: https://mathis.com/ Life Options: www.lifeoptions.org Kidney School: www.kidneyschool.org Contact a doctor if: Your symptoms get worse. You get new symptoms. Get help right away if: You get symptoms of end-stage kidney disease. These include: Headaches. Losing feeling in your hands or feet. Easy bruising. Having hiccups often. Chest pain. Shortness of breath. Lack of menstrual periods, in women. You have a fever. You make less pee than normal. You have pain or you bleed when you pee or poop. These symptoms may be an emergency. Get help right away. Call your local emergency services (911 in the [place] Do not wait to see if the symptoms will go away. Do not drive yourself to the hospital. Summary Chronic kidney disease is when lasting damage happens to the kidneys slowly over a long time. Causes of this disease include diabetes and high blood pressure. Often, there is no cure for this disease. Treatment can help symptoms and help keep the disease from getting worse. Treatment may involve lifestyle changes, medicines, and dialysis. This information is not intended to replace advice given to you by your health care provider. Make sure you discuss any  questions you have with your health care provider. Document Revised: 02/19/2020 Document Reviewed: 02/19/2020 Elsevier Patient Education  [place].

## 2021-12-03 LAB — CMP14+EGFR
ALT: 13 IU/L (ref 0–32)
AST: 26 IU/L (ref 0–40)
Albumin/Globulin Ratio: 1.6 (ref 1.2–2.2)
Albumin: 4.6 g/dL (ref 3.8–4.8)
Alkaline Phosphatase: 87 IU/L (ref 44–121)
BUN/Creatinine Ratio: 14 (ref 12–28)
BUN: 14 mg/dL (ref 8–27)
Bilirubin Total: <0.2 mg/dL (ref 0.0–1.2)
CO2: 26 mmol/L (ref 20–29)
Calcium: 9.7 mg/dL (ref 8.7–10.3)
Chloride: 99 mmol/L (ref 96–106)
Creatinine, Ser: 1.02 mg/dL — ABNORMAL HIGH (ref 0.57–1.00)
Globulin, Total: 2.8 g/dL (ref 1.5–4.5)
Glucose: 92 mg/dL (ref 70–99)
Potassium: 4.5 mmol/L (ref 3.5–5.2)
Sodium: 137 mmol/L (ref 134–144)
Total Protein: 7.4 g/dL (ref 6.0–8.5)
eGFR: 59 mL/min/{1.73_m2} — ABNORMAL LOW (ref >59)

## 2021-12-03 LAB — LIPID PANEL
Chol/HDL Ratio: 4.2 ratio (ref 0.0–4.4)
Cholesterol, Total: 208 mg/dL — ABNORMAL HIGH (ref 100–199)
HDL: 49 mg/dL (ref >39)
LDL Chol Calc (NIH): 129 mg/dL — ABNORMAL HIGH (ref 0–99)
Triglycerides: 166 mg/dL — ABNORMAL HIGH (ref 0–149)
VLDL Cholesterol Cal: 30 mg/dL (ref 5–40)

## 2021-12-03 LAB — HEMOGLOBIN A1C
Est. average glucose Bld gHb Est-mCnc: 111 mg/dL
Hgb A1c MFr Bld: 5.5 % (ref 4.8–5.6)

## 2021-12-06 DIAGNOSIS — M48061 Spinal stenosis, lumbar region without neurogenic claudication: Secondary | ICD-10-CM | POA: Diagnosis not present

## 2021-12-10 DIAGNOSIS — S29012A Strain of muscle and tendon of back wall of thorax, initial encounter: Secondary | ICD-10-CM | POA: Diagnosis not present

## 2021-12-10 DIAGNOSIS — M9902 Segmental and somatic dysfunction of thoracic region: Secondary | ICD-10-CM | POA: Diagnosis not present

## 2021-12-10 DIAGNOSIS — S39012A Strain of muscle, fascia and tendon of lower back, initial encounter: Secondary | ICD-10-CM | POA: Diagnosis not present

## 2021-12-10 DIAGNOSIS — M9903 Segmental and somatic dysfunction of lumbar region: Secondary | ICD-10-CM | POA: Diagnosis not present

## 2021-12-10 DIAGNOSIS — S338XXA Sprain of other parts of lumbar spine and pelvis, initial encounter: Secondary | ICD-10-CM | POA: Diagnosis not present

## 2021-12-10 DIAGNOSIS — M9904 Segmental and somatic dysfunction of sacral region: Secondary | ICD-10-CM | POA: Diagnosis not present

## 2021-12-14 DIAGNOSIS — M5136 Other intervertebral disc degeneration, lumbar region: Secondary | ICD-10-CM | POA: Diagnosis not present

## 2021-12-14 DIAGNOSIS — M9904 Segmental and somatic dysfunction of sacral region: Secondary | ICD-10-CM | POA: Diagnosis not present

## 2021-12-14 DIAGNOSIS — M9902 Segmental and somatic dysfunction of thoracic region: Secondary | ICD-10-CM | POA: Diagnosis not present

## 2021-12-14 DIAGNOSIS — M9903 Segmental and somatic dysfunction of lumbar region: Secondary | ICD-10-CM | POA: Diagnosis not present

## 2021-12-14 DIAGNOSIS — S29012A Strain of muscle and tendon of back wall of thorax, initial encounter: Secondary | ICD-10-CM | POA: Diagnosis not present

## 2021-12-14 DIAGNOSIS — M48061 Spinal stenosis, lumbar region without neurogenic claudication: Secondary | ICD-10-CM | POA: Diagnosis not present

## 2021-12-15 DIAGNOSIS — M9902 Segmental and somatic dysfunction of thoracic region: Secondary | ICD-10-CM | POA: Diagnosis not present

## 2021-12-15 DIAGNOSIS — M9903 Segmental and somatic dysfunction of lumbar region: Secondary | ICD-10-CM | POA: Diagnosis not present

## 2021-12-15 DIAGNOSIS — S29012A Strain of muscle and tendon of back wall of thorax, initial encounter: Secondary | ICD-10-CM | POA: Diagnosis not present

## 2021-12-15 DIAGNOSIS — M9904 Segmental and somatic dysfunction of sacral region: Secondary | ICD-10-CM | POA: Diagnosis not present

## 2021-12-15 DIAGNOSIS — M48061 Spinal stenosis, lumbar region without neurogenic claudication: Secondary | ICD-10-CM | POA: Diagnosis not present

## 2021-12-15 DIAGNOSIS — M5136 Other intervertebral disc degeneration, lumbar region: Secondary | ICD-10-CM | POA: Diagnosis not present

## 2021-12-16 DIAGNOSIS — M9904 Segmental and somatic dysfunction of sacral region: Secondary | ICD-10-CM | POA: Diagnosis not present

## 2021-12-16 DIAGNOSIS — M48061 Spinal stenosis, lumbar region without neurogenic claudication: Secondary | ICD-10-CM | POA: Diagnosis not present

## 2021-12-16 DIAGNOSIS — M5136 Other intervertebral disc degeneration, lumbar region: Secondary | ICD-10-CM | POA: Diagnosis not present

## 2021-12-16 DIAGNOSIS — M9902 Segmental and somatic dysfunction of thoracic region: Secondary | ICD-10-CM | POA: Diagnosis not present

## 2021-12-16 DIAGNOSIS — S29012A Strain of muscle and tendon of back wall of thorax, initial encounter: Secondary | ICD-10-CM | POA: Diagnosis not present

## 2021-12-16 DIAGNOSIS — M9903 Segmental and somatic dysfunction of lumbar region: Secondary | ICD-10-CM | POA: Diagnosis not present

## 2021-12-21 DIAGNOSIS — M5136 Other intervertebral disc degeneration, lumbar region: Secondary | ICD-10-CM | POA: Diagnosis not present

## 2021-12-21 DIAGNOSIS — M9903 Segmental and somatic dysfunction of lumbar region: Secondary | ICD-10-CM | POA: Diagnosis not present

## 2021-12-21 DIAGNOSIS — M9904 Segmental and somatic dysfunction of sacral region: Secondary | ICD-10-CM | POA: Diagnosis not present

## 2021-12-21 DIAGNOSIS — S29012A Strain of muscle and tendon of back wall of thorax, initial encounter: Secondary | ICD-10-CM | POA: Diagnosis not present

## 2021-12-21 DIAGNOSIS — M48061 Spinal stenosis, lumbar region without neurogenic claudication: Secondary | ICD-10-CM | POA: Diagnosis not present

## 2021-12-21 DIAGNOSIS — M9902 Segmental and somatic dysfunction of thoracic region: Secondary | ICD-10-CM | POA: Diagnosis not present

## 2021-12-23 DIAGNOSIS — S29012A Strain of muscle and tendon of back wall of thorax, initial encounter: Secondary | ICD-10-CM | POA: Diagnosis not present

## 2021-12-23 DIAGNOSIS — M48061 Spinal stenosis, lumbar region without neurogenic claudication: Secondary | ICD-10-CM | POA: Diagnosis not present

## 2021-12-23 DIAGNOSIS — M5136 Other intervertebral disc degeneration, lumbar region: Secondary | ICD-10-CM | POA: Diagnosis not present

## 2021-12-23 DIAGNOSIS — M9904 Segmental and somatic dysfunction of sacral region: Secondary | ICD-10-CM | POA: Diagnosis not present

## 2021-12-23 DIAGNOSIS — M9903 Segmental and somatic dysfunction of lumbar region: Secondary | ICD-10-CM | POA: Diagnosis not present

## 2021-12-23 DIAGNOSIS — M9902 Segmental and somatic dysfunction of thoracic region: Secondary | ICD-10-CM | POA: Diagnosis not present

## 2021-12-29 DIAGNOSIS — S29012A Strain of muscle and tendon of back wall of thorax, initial encounter: Secondary | ICD-10-CM | POA: Diagnosis not present

## 2021-12-29 DIAGNOSIS — M9903 Segmental and somatic dysfunction of lumbar region: Secondary | ICD-10-CM | POA: Diagnosis not present

## 2021-12-29 DIAGNOSIS — M9904 Segmental and somatic dysfunction of sacral region: Secondary | ICD-10-CM | POA: Diagnosis not present

## 2021-12-29 DIAGNOSIS — M48061 Spinal stenosis, lumbar region without neurogenic claudication: Secondary | ICD-10-CM | POA: Diagnosis not present

## 2021-12-29 DIAGNOSIS — M5136 Other intervertebral disc degeneration, lumbar region: Secondary | ICD-10-CM | POA: Diagnosis not present

## 2021-12-29 DIAGNOSIS — M9902 Segmental and somatic dysfunction of thoracic region: Secondary | ICD-10-CM | POA: Diagnosis not present

## 2021-12-31 DIAGNOSIS — M9903 Segmental and somatic dysfunction of lumbar region: Secondary | ICD-10-CM | POA: Diagnosis not present

## 2021-12-31 DIAGNOSIS — S29012A Strain of muscle and tendon of back wall of thorax, initial encounter: Secondary | ICD-10-CM | POA: Diagnosis not present

## 2021-12-31 DIAGNOSIS — M48061 Spinal stenosis, lumbar region without neurogenic claudication: Secondary | ICD-10-CM | POA: Diagnosis not present

## 2021-12-31 DIAGNOSIS — M5136 Other intervertebral disc degeneration, lumbar region: Secondary | ICD-10-CM | POA: Diagnosis not present

## 2021-12-31 DIAGNOSIS — M9904 Segmental and somatic dysfunction of sacral region: Secondary | ICD-10-CM | POA: Diagnosis not present

## 2021-12-31 DIAGNOSIS — M9902 Segmental and somatic dysfunction of thoracic region: Secondary | ICD-10-CM | POA: Diagnosis not present

## 2022-01-01 ENCOUNTER — Other Ambulatory Visit: Payer: Self-pay | Admitting: Nurse Practitioner

## 2022-01-01 DIAGNOSIS — E782 Mixed hyperlipidemia: Secondary | ICD-10-CM

## 2022-01-01 DIAGNOSIS — I10 Essential (primary) hypertension: Secondary | ICD-10-CM

## 2022-01-07 DIAGNOSIS — M5136 Other intervertebral disc degeneration, lumbar region: Secondary | ICD-10-CM | POA: Diagnosis not present

## 2022-01-07 DIAGNOSIS — M9904 Segmental and somatic dysfunction of sacral region: Secondary | ICD-10-CM | POA: Diagnosis not present

## 2022-01-07 DIAGNOSIS — M48061 Spinal stenosis, lumbar region without neurogenic claudication: Secondary | ICD-10-CM | POA: Diagnosis not present

## 2022-01-07 DIAGNOSIS — S29012A Strain of muscle and tendon of back wall of thorax, initial encounter: Secondary | ICD-10-CM | POA: Diagnosis not present

## 2022-01-07 DIAGNOSIS — M9902 Segmental and somatic dysfunction of thoracic region: Secondary | ICD-10-CM | POA: Diagnosis not present

## 2022-01-07 DIAGNOSIS — M9903 Segmental and somatic dysfunction of lumbar region: Secondary | ICD-10-CM | POA: Diagnosis not present

## 2022-01-14 DIAGNOSIS — M9904 Segmental and somatic dysfunction of sacral region: Secondary | ICD-10-CM | POA: Diagnosis not present

## 2022-01-14 DIAGNOSIS — M9902 Segmental and somatic dysfunction of thoracic region: Secondary | ICD-10-CM | POA: Diagnosis not present

## 2022-01-14 DIAGNOSIS — M9903 Segmental and somatic dysfunction of lumbar region: Secondary | ICD-10-CM | POA: Diagnosis not present

## 2022-01-14 DIAGNOSIS — M48061 Spinal stenosis, lumbar region without neurogenic claudication: Secondary | ICD-10-CM | POA: Diagnosis not present

## 2022-01-14 DIAGNOSIS — S29012A Strain of muscle and tendon of back wall of thorax, initial encounter: Secondary | ICD-10-CM | POA: Diagnosis not present

## 2022-01-14 DIAGNOSIS — M5136 Other intervertebral disc degeneration, lumbar region: Secondary | ICD-10-CM | POA: Diagnosis not present

## 2022-01-18 DIAGNOSIS — Z1231 Encounter for screening mammogram for malignant neoplasm of breast: Secondary | ICD-10-CM | POA: Diagnosis not present

## 2022-01-18 LAB — HM MAMMOGRAPHY

## 2022-01-19 DIAGNOSIS — M5136 Other intervertebral disc degeneration, lumbar region: Secondary | ICD-10-CM | POA: Diagnosis not present

## 2022-01-19 DIAGNOSIS — M9903 Segmental and somatic dysfunction of lumbar region: Secondary | ICD-10-CM | POA: Diagnosis not present

## 2022-01-19 DIAGNOSIS — M48061 Spinal stenosis, lumbar region without neurogenic claudication: Secondary | ICD-10-CM | POA: Diagnosis not present

## 2022-01-19 DIAGNOSIS — M9902 Segmental and somatic dysfunction of thoracic region: Secondary | ICD-10-CM | POA: Diagnosis not present

## 2022-01-19 DIAGNOSIS — S29012A Strain of muscle and tendon of back wall of thorax, initial encounter: Secondary | ICD-10-CM | POA: Diagnosis not present

## 2022-01-19 DIAGNOSIS — M9904 Segmental and somatic dysfunction of sacral region: Secondary | ICD-10-CM | POA: Diagnosis not present

## 2022-01-21 ENCOUNTER — Encounter: Payer: Self-pay | Admitting: Nurse Practitioner

## 2022-02-02 DIAGNOSIS — M5136 Other intervertebral disc degeneration, lumbar region: Secondary | ICD-10-CM | POA: Diagnosis not present

## 2022-02-02 DIAGNOSIS — M9902 Segmental and somatic dysfunction of thoracic region: Secondary | ICD-10-CM | POA: Diagnosis not present

## 2022-02-02 DIAGNOSIS — M9904 Segmental and somatic dysfunction of sacral region: Secondary | ICD-10-CM | POA: Diagnosis not present

## 2022-02-02 DIAGNOSIS — M9903 Segmental and somatic dysfunction of lumbar region: Secondary | ICD-10-CM | POA: Diagnosis not present

## 2022-02-02 DIAGNOSIS — S29012A Strain of muscle and tendon of back wall of thorax, initial encounter: Secondary | ICD-10-CM | POA: Diagnosis not present

## 2022-02-02 DIAGNOSIS — M48061 Spinal stenosis, lumbar region without neurogenic claudication: Secondary | ICD-10-CM | POA: Diagnosis not present

## 2022-02-22 ENCOUNTER — Encounter: Payer: Self-pay | Admitting: Nurse Practitioner

## 2022-02-22 ENCOUNTER — Other Ambulatory Visit: Payer: Self-pay

## 2022-02-22 ENCOUNTER — Ambulatory Visit (INDEPENDENT_AMBULATORY_CARE_PROVIDER_SITE_OTHER): Payer: Medicare PPO | Admitting: Nurse Practitioner

## 2022-02-22 VITALS — BP 128/78 | HR 78 | Temp 98.3°F | Ht 66.6 in | Wt 203.0 lb

## 2022-02-22 DIAGNOSIS — N183 Chronic kidney disease, stage 3 unspecified: Secondary | ICD-10-CM | POA: Diagnosis not present

## 2022-02-22 DIAGNOSIS — Z23 Encounter for immunization: Secondary | ICD-10-CM

## 2022-02-22 DIAGNOSIS — R82998 Other abnormal findings in urine: Secondary | ICD-10-CM | POA: Diagnosis not present

## 2022-02-22 DIAGNOSIS — Z1211 Encounter for screening for malignant neoplasm of colon: Secondary | ICD-10-CM

## 2022-02-22 DIAGNOSIS — L659 Nonscarring hair loss, unspecified: Secondary | ICD-10-CM | POA: Diagnosis not present

## 2022-02-22 DIAGNOSIS — E782 Mixed hyperlipidemia: Secondary | ICD-10-CM

## 2022-02-22 DIAGNOSIS — R7303 Prediabetes: Secondary | ICD-10-CM

## 2022-02-22 DIAGNOSIS — E6609 Other obesity due to excess calories: Secondary | ICD-10-CM

## 2022-02-22 DIAGNOSIS — Z0000 Encounter for general adult medical examination without abnormal findings: Secondary | ICD-10-CM

## 2022-02-22 DIAGNOSIS — I129 Hypertensive chronic kidney disease with stage 1 through stage 4 chronic kidney disease, or unspecified chronic kidney disease: Secondary | ICD-10-CM | POA: Diagnosis not present

## 2022-02-22 DIAGNOSIS — Z6832 Body mass index (BMI) 32.0-32.9, adult: Secondary | ICD-10-CM

## 2022-02-22 LAB — POCT URINALYSIS DIPSTICK
Bilirubin, UA: NEGATIVE
Glucose, UA: NEGATIVE
Ketones, UA: NEGATIVE
Nitrite, UA: NEGATIVE
Protein, UA: NEGATIVE
Spec Grav, UA: 1.01 (ref 1.010–1.025)
Urobilinogen, UA: 0.2 E.U./dL
pH, UA: 7 (ref 5.0–8.0)

## 2022-02-22 NOTE — Progress Notes (Signed)
?Rich Brave [place],acting as a Education administrator for [employer], FNP.,have documented all relevant documentation on the behalf of [name], FNP,as directed by  [name], FNP while in the presence of [name], [place].  ?This visit occurred during the SARS-CoV-2 public health emergency.  Safety protocols were in place, including screening questions prior to the visit, additional usage of staff PPE, and extensive cleaning of exam room while observing appropriate contact time as indicated for disinfecting solutions. ? ?Subjective:  ?  ? Patient ID: [name] , female    DOB: 1951-01-16 , 71 y.o.   MRN: [id number] ? ? ?Chief Complaint  ?Patient presents with  ? Annual Exam  ? ? ?HPI ? ?Patient presents today for a physical. She reports compliance with her medications. Patient does not have any questions or concerns at this time. She is now seeing the chiropractor once a month.  ? ?Wt Readings from Last 3 Encounters: ?02/22/22 : 203 lb (92.1 kg) ?12/02/21 : 203 lb 6.4 oz (92.3 kg) ?08/23/21 : 203 lb 6.4 oz (92.3 kg) ?  ?  ? ?Past Medical History:  ?Diagnosis Date  ? Arthritis   ? Constipation   ? GERD (gastroesophageal reflux disease)   ? Hypertension   ?  ? ?Family History  ?Problem Relation Age of Onset  ? Diabetes Mother   ? Heart attack Father   ? Cancer Brother   ? Anesthesia problems Neg Hx   ? ? ? ?Current Outpatient Medications:  ?  alendronate (FOSAMAX) 70 MG tablet, TAKE 1 TABLET (70 MG TOTAL) BY MOUTH ONCE A WEEK. TAKE WITH A FULL GLASS OF WATER ON AN EMPTY STOMACH., Disp: 12 tablet, Rfl: 2 ?  aspirin 81 MG tablet, Take 81 mg by mouth daily., Disp: , Rfl:  ?  dexlansoprazole (DEXILANT) 60 MG capsule, Take 60 mg by mouth daily., Disp: , Rfl:  ?  gabapentin (NEURONTIN) 300 MG capsule, Take 300 mg by mouth at bedtime., Disp: , Rfl:  ?  lisinopril-hydrochlorothiazide (ZESTORETIC) 20-12.5 MG tablet, TAKE 1 TABLET BY MOUTH EVERY DAY, Disp: 90 tablet, Rfl: 1 ?  meloxicam (MOBIC) 15 MG tablet, Take 15 mg by  mouth daily., Disp: , Rfl:  ?  simvastatin (ZOCOR) 40 MG tablet, TAKE 1 TABLET BY MOUTH EVERY DAY, Disp: 90 tablet, Rfl: 1  ? ?Allergies  ?Allergen Reactions  ? Penicillins Hives  ?  ? ? ?The patient states she is status post hysterectomy.  No LMP recorded. Patient has had a hysterectomy.. Negative for Dysmenorrhea and Negative for Menorrhagia. Negative for: breast discharge, breast lump(s), breast pain and breast self exam. Associated symptoms include abnormal vaginal bleeding. Pertinent negatives include abnormal bleeding (hematology), anxiety, decreased libido, depression, difficulty falling sleep, dyspareunia, history of infertility, nocturia, sexual dysfunction, sleep disturbances, urinary incontinence, urinary urgency, vaginal discharge and vaginal itching. Diet regular; fried foods and sweets. The patient states her exercise level is minimal with 2 days a week with chair exercises.  ? ?The patient's tobacco use is:  ?Social History  ? ?Tobacco Use  ?Smoking Status Never  ?Smokeless Tobacco Never  ?Marland Kitchen She has been exposed to passive smoke. The patient's alcohol use is:  ?Social History  ? ?Substance and Sexual Activity  ?Alcohol Use No  ? ? ?Review of Systems  ?Constitutional: Negative.   ?HENT: Negative.    ?Eyes: Negative.   ?Respiratory: Negative.    ?Cardiovascular: Negative.   ?Gastrointestinal: Negative.   ?Endocrine: Negative.   ?Genitourinary: Negative.   ?Musculoskeletal: Negative.   ?  Skin: Negative.   ?Allergic/Immunologic: Negative.   ?Neurological: Negative.   ?Hematological: Negative.   ?Psychiatric/Behavioral: Negative.     ? ?Today's Vitals  ? 02/22/22 1436  ?BP: 128/78  ?Pulse: 78  ?Temp: 98.3 ?F (36.8 ?C)  ?Weight: 203 lb (92.1 kg)  ?Height: 5' 6.6" (1.692 m)  ?PainSc: 0-No pain  ? ?Body mass index is 32.18 kg/m?.  ? ?Objective:  ?Physical Exam ?Vitals reviewed.  ?Constitutional:   ?   General: She is not in acute distress. ?   Appearance: Normal appearance. She is well-developed. She is obese.   ?HENT:  ?   Head: Normocephalic and atraumatic.  ?   Right Ear: Hearing, tympanic membrane, ear canal and external ear normal. There is no impacted cerumen.  ?   Left Ear: Hearing, tympanic membrane, ear canal and external ear normal. There is no impacted cerumen.  ?   Nose:  ?   Comments: Deferred - masked ?   Mouth/Throat:  ?   Comments: Deferred - masked ?Eyes:  ?   General: Lids are normal.  ?   Extraocular Movements: Extraocular movements intact.  ?   Conjunctiva/sclera: Conjunctivae normal.  ?   Pupils: Pupils are equal, round, and reactive to light.  ?   Funduscopic exam: ?   Right eye: No papilledema.     ?   Left eye: No papilledema.  ?Neck:  ?   Thyroid: No thyroid mass.  ?   Vascular: No carotid bruit.  ?Cardiovascular:  ?   Rate and Rhythm: Normal rate and regular rhythm.  ?   Pulses: Normal pulses.  ?   Heart sounds: Normal heart sounds. No murmur heard. ?Pulmonary:  ?   Effort: Pulmonary effort is normal. No respiratory distress.  ?   Breath sounds: Normal breath sounds. No wheezing.  ?Chest:  ?   Chest wall: No mass.  ?Breasts: ?   Tanner Score is 5.  ?   Right: Normal. No mass or tenderness.  ?   Left: Normal. No mass or tenderness.  ?Abdominal:  ?   General: Abdomen is flat. Bowel sounds are normal. There is no distension.  ?   Palpations: Abdomen is soft.  ?   Tenderness: There is no abdominal tenderness.  ?Musculoskeletal:     ?   General: No swelling. Normal range of motion.  ?   Cervical back: Full passive range of motion without pain, normal range of motion and neck supple.  ?   Right lower leg: No edema.  ?   Left lower leg: No edema.  ?Lymphadenopathy:  ?   Upper Body:  ?   Right upper body: No supraclavicular, axillary or pectoral adenopathy.  ?   Left upper body: No supraclavicular, axillary or pectoral adenopathy.  ?Skin: ?   General: Skin is warm and dry.  ?   Capillary Refill: Capillary refill takes less than 2 seconds.  ?Neurological:  ?   General: No focal deficit present.  ?   Mental  Status: She is alert and oriented to person, place, and time.  ?   Cranial Nerves: No cranial nerve deficit.  ?   Sensory: No sensory deficit.  ?Psychiatric:     ?   Mood and Affect: Mood normal.     ?   Behavior: Behavior normal.     ?   Thought Content: Thought content normal.     ?   Judgment: Judgment normal.  ?  ? ?   ?Assessment And  Plan:  ?   ?1. Encounter for general adult medical examination w/o abnormal findings ? ?2. Benign hypertension with CKD (chronic kidney disease) stage III (HCC) ?Comments: Blood pressure is well controlled, continue current medications ?- POCT Urinalysis Dipstick (49449) ?- EKG 12-Lead ?- CBC ?- BMP8+eGFR ?- Microalbumin / Creatinine Urine Ratio ? ?3. Prediabetes ?Comments: Stable, diet controlled, continue current medications ? ?4. Mixed hyperlipidemia ?Comments: Continue statin, tolerating well.  ?- Lipid panel ?- BMP8+eGFR ? ?5. Immunization due ?- Pneumococcal conjugate vaccine 20-valent (Prevnar-20) ? ?6. Class 1 obesity due to excess calories with body mass index (BMI) of 32.0 to 32.9 in adult, unspecified whether serious comorbidity present ?Chronic ?Discussed healthy diet and regular exercise options  ?Encouraged to exercise at least 150 minutes per week with 2 days of strength training ? ?7. Encounter for screening colonoscopy ?According to USPTF Colorectal cancer Screening guidelines. Colonoscopy is recommended every 10 years, starting at age 61 years. ?Will refer to GI for colon cancer screening. ?- Ambulatory referral to Gastroenterology ? ?8. Hair loss ?Comments: Will check thyroid levels and magnesium, pending results will consider referral to dermatology for evaluation ?- TSH ?- Magnesium ? ?9. Urine white blood cells increased ?Comments: Will send for culture, no symptoms. Pending results will start antibiotic.  ?- Culture, Urine ? ? ? ? ?Patient was given opportunity to ask questions. Patient verbalized understanding of the plan and was able to repeat key elements  of the plan. All questions were answered to their satisfaction.  ? ?[name], FNP  ? ?I, [name], FNP, have reviewed all documentation for this visit. The documentation on 03/05/22 for the exam

## 2022-02-22 NOTE — Patient Instructions (Signed)

## 2022-02-23 LAB — CBC
Hematocrit: 38.1 % (ref 34.0–46.6)
Hemoglobin: 12.4 g/dL (ref 11.1–15.9)
MCH: 29.1 pg (ref 26.6–33.0)
MCHC: 32.5 g/dL (ref 31.5–35.7)
MCV: 89 fL (ref 79–97)
Platelets: 314 10*3/uL (ref 150–450)
RBC: 4.26 x10E6/uL (ref 3.77–5.28)
RDW: 12.7 % (ref 11.7–15.4)
WBC: 5.4 10*3/uL (ref 3.4–10.8)

## 2022-02-23 LAB — LIPID PANEL
Chol/HDL Ratio: 3.2 ratio (ref 0.0–4.4)
Cholesterol, Total: 175 mg/dL (ref 100–199)
HDL: 55 mg/dL (ref >39)
LDL Chol Calc (NIH): 102 mg/dL — ABNORMAL HIGH (ref 0–99)
Triglycerides: 98 mg/dL (ref 0–149)
VLDL Cholesterol Cal: 18 mg/dL (ref 5–40)

## 2022-02-23 LAB — BMP8+EGFR
BUN/Creatinine Ratio: 20 (ref 12–28)
BUN: 19 mg/dL (ref 8–27)
CO2: 22 mmol/L (ref 20–29)
Calcium: 9.7 mg/dL (ref 8.7–10.3)
Chloride: 94 mmol/L — ABNORMAL LOW (ref 96–106)
Creatinine, Ser: 0.97 mg/dL (ref 0.57–1.00)
Glucose: 82 mg/dL (ref 70–99)
Potassium: 4.3 mmol/L (ref 3.5–5.2)
Sodium: 135 mmol/L (ref 134–144)
eGFR: 62 mL/min/{1.73_m2} (ref >59)

## 2022-02-23 LAB — MAGNESIUM: Magnesium: 1.9 mg/dL (ref 1.6–2.3)

## 2022-02-23 LAB — MICROALBUMIN / CREATININE URINE RATIO
Creatinine, Urine: 39.3 mg/dL
Microalb/Creat Ratio: <8 mg/g creat (ref 0–29)
Microalbumin, Urine: <3 ug/mL

## 2022-02-23 LAB — TSH: TSH: 0.582 u[IU]/mL (ref 0.450–4.500)

## 2022-02-24 LAB — URINE CULTURE

## 2022-03-02 DIAGNOSIS — S29012A Strain of muscle and tendon of back wall of thorax, initial encounter: Secondary | ICD-10-CM | POA: Diagnosis not present

## 2022-03-02 DIAGNOSIS — M9904 Segmental and somatic dysfunction of sacral region: Secondary | ICD-10-CM | POA: Diagnosis not present

## 2022-03-02 DIAGNOSIS — M9903 Segmental and somatic dysfunction of lumbar region: Secondary | ICD-10-CM | POA: Diagnosis not present

## 2022-03-02 DIAGNOSIS — M9902 Segmental and somatic dysfunction of thoracic region: Secondary | ICD-10-CM | POA: Diagnosis not present

## 2022-03-02 DIAGNOSIS — M48061 Spinal stenosis, lumbar region without neurogenic claudication: Secondary | ICD-10-CM | POA: Diagnosis not present

## 2022-03-02 DIAGNOSIS — M5136 Other intervertebral disc degeneration, lumbar region: Secondary | ICD-10-CM | POA: Diagnosis not present

## 2022-03-17 ENCOUNTER — Ambulatory Visit: Payer: Medicare PPO | Admitting: Nurse Practitioner

## 2022-03-17 ENCOUNTER — Ambulatory Visit: Payer: Medicare PPO

## 2022-03-23 ENCOUNTER — Ambulatory Visit (INDEPENDENT_AMBULATORY_CARE_PROVIDER_SITE_OTHER): Payer: Medicare PPO

## 2022-03-23 VITALS — BP 130/70 | HR 89 | Temp 98.7°F | Ht 67.4 in | Wt 200.4 lb

## 2022-03-23 DIAGNOSIS — Z0000 Encounter for general adult medical examination without abnormal findings: Secondary | ICD-10-CM

## 2022-03-23 NOTE — Patient Instructions (Signed)
[name] , ?Thank you for taking time to come for your Medicare Wellness Visit. I appreciate your ongoing commitment to your health goals. Please review the following plan we discussed and let me know if I can assist you in the future.  ? ?Screening recommendations/referrals: ?Colonoscopy: due ?Mammogram: completed 01/18/2022, due 2/22/204 ?Bone Density: completed 06/15/2016 ?Recommended yearly ophthalmology/optometry visit for glaucoma screening and checkup ?Recommended yearly dental visit for hygiene and checkup ? ?Vaccinations: ?Influenza vaccine: due 06/28/2022 ?Pneumococcal vaccine: completed 02/22/2022 ?Tdap vaccine: completed 10/30/2012, due 10/30/2022 ?Shingles vaccine: completed    ?Covid-19:08/12/2021, 03/11/2021, 09/03/2020, 02/04/2020, 01/12/2020 ? ?Advanced directives: Advance directive discussed with you today. I have provided a copy for you to complete at home and have notarized. Once this is complete please bring a copy in to our office so we can scan it into your chart. ? ?Conditions/risks identified: none ? ?Next appointment: Follow up in one year for your annual wellness visit  ? ? ?Preventive Care 48 Years and Older, Female ?Preventive care refers to lifestyle choices and visits with your health care provider that can promote health and wellness. ?What does preventive care include? ?A yearly physical exam. This is also called an annual well check. ?Dental exams once or twice a year. ?Routine eye exams. Ask your health care provider how often you should have your eyes checked. ?Personal lifestyle choices, including: ?Daily care of your teeth and gums. ?Regular physical activity. ?Eating a healthy diet. ?Avoiding tobacco and drug use. ?Limiting alcohol use. ?Practicing safe sex. ?Taking low-dose aspirin every day. ?Taking vitamin and mineral supplements as recommended by your health care provider. ?What happens during an annual well check? ?The services and screenings done by your health care provider during  your annual well check will depend on your age, overall health, lifestyle risk factors, and family history of disease. ?Counseling  ?Your health care provider may ask you questions about your: ?Alcohol use. ?Tobacco use. ?Drug use. ?Emotional well-being. ?Home and relationship well-being. ?Sexual activity. ?Eating habits. ?History of falls. ?Memory and ability to understand (cognition). ?Work and work Astronomer. ?Reproductive health. ?Screening  ?You may have the following tests or measurements: ?Height, weight, and BMI. ?Blood pressure. ?Lipid and cholesterol levels. These may be checked every 5 years, or more frequently if you are over 6 years old. ?Skin check. ?Lung cancer screening. You may have this screening every year starting at age 72 if you have a 30-pack-year history of smoking and currently smoke or have quit within the past 15 years. ?Fecal occult blood test (FOBT) of the stool. You may have this test every year starting at age 82. ?Flexible sigmoidoscopy or colonoscopy. You may have a sigmoidoscopy every 5 years or a colonoscopy every 10 years starting at age 54. ?Hepatitis C blood test. ?Hepatitis B blood test. ?Sexually transmitted disease (STD) testing. ?Diabetes screening. This is done by checking your blood sugar (glucose) after you have not eaten for a while (fasting). You may have this done every 1-3 years. ?Bone density scan. This is done to screen for osteoporosis. You may have this done starting at age 15. ?Mammogram. This may be done every 1-2 years. Talk to your health care provider about how often you should have regular mammograms. ?Talk with your health care provider about your test results, treatment options, and if necessary, the need for more tests. ?Vaccines  ?Your health care provider may recommend certain vaccines, such as: ?Influenza vaccine. This is recommended every year. ?Tetanus, diphtheria, and acellular pertussis (Tdap, Td) vaccine.  You may need a Td booster every 10  years. ?Zoster vaccine. You may need this after age 41. ?Pneumococcal 13-valent conjugate (PCV13) vaccine. One dose is recommended after age 55. ?Pneumococcal polysaccharide (PPSV23) vaccine. One dose is recommended after age 47. ?Talk to your health care provider about which screenings and vaccines you need and how often you need them. ?This information is not intended to replace advice given to you by your health care provider. Make sure you discuss any questions you have with your health care provider. ?Document Released: 12/11/2015 Document Revised: 08/03/2016 Document Reviewed: 09/15/2015 ?Elsevier Interactive Patient Education ? 2017 [place]. ? ?Fall Prevention in the Home ?Falls can cause injuries. They can happen to people of all ages. There are many things you can do to make your home safe and to help prevent falls. ?What can I do on the outside of my home? ?Regularly fix the edges of walkways and driveways and fix any cracks. ?Remove anything that might make you trip as you walk through a door, such as a raised step or threshold. ?Trim any bushes or trees on the path to your home. ?Use bright outdoor lighting. ?Clear any walking paths of anything that might make someone trip, such as rocks or tools. ?Regularly check to see if handrails are loose or broken. Make sure that both sides of any steps have handrails. ?Any raised decks and porches should have guardrails on the edges. ?Have any leaves, snow, or ice cleared regularly. ?Use sand or salt on walking paths during winter. ?Clean up any spills in your garage right away. This includes oil or grease spills. ?What can I do in the bathroom? ?Use night lights. ?Install grab bars by the toilet and in the tub and shower. Do not use towel bars as grab bars. ?Use non-skid mats or decals in the tub or shower. ?If you need to sit down in the shower, use a plastic, non-slip stool. ?Keep the floor dry. Clean up any water that spills on the floor as soon as it  happens. ?Remove soap buildup in the tub or shower regularly. ?Attach bath mats securely with double-sided non-slip rug tape. ?Do not have throw rugs and other things on the floor that can make you trip. ?What can I do in the bedroom? ?Use night lights. ?Make sure that you have a light by your bed that is easy to reach. ?Do not use any sheets or blankets that are too big for your bed. They should not hang down onto the floor. ?Have a firm chair that has side arms. You can use this for support while you get dressed. ?Do not have throw rugs and other things on the floor that can make you trip. ?What can I do in the kitchen? ?Clean up any spills right away. ?Avoid walking on wet floors. ?Keep items that you use a lot in easy-to-reach places. ?If you need to reach something above you, use a strong step stool that has a grab bar. ?Keep electrical cords out of the way. ?Do not use floor polish or wax that makes floors slippery. If you must use wax, use non-skid floor wax. ?Do not have throw rugs and other things on the floor that can make you trip. ?What can I do with my stairs? ?Do not leave any items on the stairs. ?Make sure that there are handrails on both sides of the stairs and use them. Fix handrails that are broken or loose. Make sure that handrails are as long as  the stairways. ?Check any carpeting to make sure that it is firmly attached to the stairs. Fix any carpet that is loose or worn. ?Avoid having throw rugs at the top or bottom of the stairs. If you do have throw rugs, attach them to the floor with carpet tape. ?Make sure that you have a light switch at the top of the stairs and the bottom of the stairs. If you do not have them, ask someone to add them for you. ?What else can I do to help prevent falls? ?Wear shoes that: ?Do not have high heels. ?Have rubber bottoms. ?Are comfortable and fit you well. ?Are closed at the toe. Do not wear sandals. ?If you use a stepladder: ?Make sure that it is fully opened.  Do not climb a closed stepladder. ?Make sure that both sides of the stepladder are locked into place. ?Ask someone to hold it for you, if possible. ?Clearly mark and make sure that you can see: ?Any grab ba

## 2022-03-23 NOTE — Progress Notes (Signed)
?This visit occurred during the SARS-CoV-2 public health emergency.  Safety protocols were in place, including screening questions prior to the visit, additional usage of staff PPE, and extensive cleaning of exam room while observing appropriate contact time as indicated for disinfecting solutions. ? ?Subjective:  ? [name] is a 71 y.o. female who presents for Medicare Annual (Subsequent) preventive examination. ? ?Review of Systems    ? ?Cardiac Risk Factors include: advanced age (>17men, >51 women);dyslipidemia;hypertension;obesity (BMI >30kg/m2) ? ?   ?Objective:  ?  ?Today's Vitals  ? 03/23/22 1210 03/23/22 1215  ?BP: 130/70   ?Pulse: 89   ?Temp: 98.7 ?F (37.1 ?C)   ?TempSrc: Oral   ?SpO2: 98%   ?Weight: 200 lb 6.4 oz (90.9 kg)   ?Height: 5' 7.4" (1.712 m)   ?PainSc:  4   ? ?Body mass index is 31.02 kg/m?. ? ? ?  03/23/2022  ? 12:19 PM 03/17/2021  ?  4:01 PM 02/12/2020  ?  3:17 PM 01/23/2019  ?  3:00 PM 02/08/2012  ?  5:19 PM 02/08/2012  ?  7:14 AM 01/31/2012  ?  8:30 AM  ?Advanced Directives  ?Does Patient Have a Medical Advance Directive? No No No No Patient does not have advance directive;Patient would not like information Patient does not have advance directive;Patient would not like information   ?Would patient like information on creating a medical advance directive? Yes (MAU/Ambulatory/Procedural Areas - Information given) No - Patient declined No - Patient declined Yes (MAU/Ambulatory/Procedural Areas - Information given)     ?Pre-existing out of facility DNR order (yellow form or pink MOST form)     No  No  ? ? ?Current Medications (verified) ?Outpatient Encounter Medications as of 03/23/2022  ?Medication Sig  ? alendronate (FOSAMAX) 70 MG tablet TAKE 1 TABLET (70 MG TOTAL) BY MOUTH ONCE A WEEK. TAKE WITH A FULL GLASS OF WATER ON AN EMPTY STOMACH.  ? aspirin 81 MG tablet Take 81 mg by mouth daily.  ? dexlansoprazole (DEXILANT) 60 MG capsule Take 60 mg by mouth daily.  ? lisinopril-hydrochlorothiazide  (ZESTORETIC) 20-12.5 MG tablet TAKE 1 TABLET BY MOUTH EVERY DAY  ? simvastatin (ZOCOR) 40 MG tablet TAKE 1 TABLET BY MOUTH EVERY DAY  ? gabapentin (NEURONTIN) 300 MG capsule Take 300 mg by mouth at bedtime. (Patient not taking: Reported on 03/23/2022)  ? meloxicam (MOBIC) 15 MG tablet Take 15 mg by mouth daily. (Patient not taking: Reported on 03/23/2022)  ? ?No facility-administered encounter medications on file as of 03/23/2022.  ? ? ?Allergies (verified) ?Penicillins  ? ?History: ?Past Medical History:  ?Diagnosis Date  ? Arthritis   ? Constipation   ? GERD (gastroesophageal reflux disease)   ? Hypertension   ? ?Past Surgical History:  ?Procedure Laterality Date  ? ABDOMINAL HYSTERECTOMY    ? BREAST SURGERY    ? bio  neg cancer  ? EYE SURGERY  2019  ? bilateral cataract surgery  ? ?Family History  ?Problem Relation Age of Onset  ? Diabetes Mother   ? Heart attack Father   ? Cancer Brother   ? Anesthesia problems Neg Hx   ? ?Social History  ? ?Socioeconomic History  ? Marital status: Married  ?  Spouse name: Not on file  ? Number of children: Not on file  ? Years of education: Not on file  ? Highest education level: Not on file  ?Occupational History  ? Occupation: retired  ?Tobacco Use  ? Smoking status: Never  ?  Smokeless tobacco: Never  ?Vaping Use  ? Vaping Use: Never used  ?Substance and Sexual Activity  ? Alcohol use: No  ? Drug use: No  ? Sexual activity: Not Currently  ?Other Topics Concern  ? Not on file  ?Social History Narrative  ? Not on file  ? ?Social Determinants of Health  ? ?Financial Resource Strain: Low Risk   ? Difficulty of Paying Living Expenses: Not hard at all  ?Food Insecurity: No Food Insecurity  ? Worried About Charity fundraiser in the Last Year: Never true  ? Ran Out of Food in the Last Year: Never true  ?Transportation Needs: No Transportation Needs  ? Lack of Transportation (Medical): No  ? Lack of Transportation (Non-Medical): No  ?Physical Activity: Insufficiently Active  ? Days of  Exercise per Week: 2 days  ? Minutes of Exercise per Session: 50 min  ?Stress: No Stress Concern Present  ? Feeling of Stress : Not at all  ?Social Connections: Not on file  ? ? ?Tobacco Counseling ?Counseling given: Not Answered ? ? ?Clinical Intake: ? ?Pre-visit preparation completed: Yes ? ?Pain : 0-10 ?Pain Score: 4  ?Pain Type: Chronic pain ?Pain Location: Neck ?Pain Radiating Towards: down right shoulder ?Pain Descriptors / Indicators: Aching, Sore ?Pain Onset: More than a month ago ?Pain Frequency: Intermittent ? ?  ? ?Nutritional Status: BMI > 30  Obese ?Nutritional Risks: None ?Diabetes: No ? ?How often do you need to have someone help you when you read instructions, pamphlets, or other written materials from your doctor or pharmacy?: 1 - Never ?What is the last grade level you completed in school?: 12th grade ? ?Diabetic? no ? ?Interpreter Needed?: No ? ?Information entered by :: NAllen LPN ? ? ?Activities of Daily Living ? ?  03/23/2022  ? 12:21 PM  ?In your present state of health, do you have any difficulty performing the following activities:  ?Hearing? 1  ?Comment decreased  ?Vision? 0  ?Difficulty concentrating or making decisions? 0  ?Walking or climbing stairs? 1  ?Comment slower  ?Dressing or bathing? 0  ?Doing errands, shopping? 0  ?Preparing Food and eating ? N  ?Using the Toilet? N  ?In the past six months, have you accidently leaked urine? N  ?Do you have problems with loss of bowel control? N  ?Managing your Medications? N  ?Managing your Finances? N  ?Housekeeping or managing your Housekeeping? N  ? ? ?Patient Care Team: ?[name], FNP as PCP - General (General Practice) ?Poudyal, Ritesh, OD (Optometry) ? ?Indicate any recent Medical Services you may have received from other than [facility] providers in the past year (date may be approximate). ? ?   ?Assessment:  ? This is a routine wellness examination for [name]. ? ?Hearing/Vision screen ?Vision Screening - Comments:: Regular eye exams, Dr.  Dorcas Carrow ? ?Dietary issues and exercise activities discussed: ?Current Exercise Habits: Home exercise routine, Type of exercise: yoga, Time (Minutes): 45 ? ? Goals Addressed   ? ?  ?  ?  ?  ? This Visit's Progress  ?  Patient Stated     ?  03/23/2022, wants to lose weight and be more active ?  ? ?  ? ?Depression Screen ? ?  03/23/2022  ? 12:21 PM 03/17/2021  ?  4:02 PM 02/17/2021  ?  2:39 PM 02/12/2020  ?  3:18 PM 02/12/2020  ?  2:43 PM 07/24/2019  ?  2:12 PM 03/07/2019  ? 10:11 AM  ?PHQ 2/9 Scores  ?  PHQ - 2 Score 0 0 1 0 0 0 1  ?PHQ- 9 Score    2   9  ?  ?Fall Risk ? ?  03/23/2022  ? 12:21 PM 03/17/2021  ?  4:01 PM 03/17/2021  ?  3:33 PM 02/12/2020  ?  3:18 PM 02/12/2020  ?  2:43 PM  ?Fall Risk   ?Falls in the past year? 0 0 0 0 0  ?Number falls in past yr: 0      ?Injury with Fall? 0      ?Risk for fall due to : Medication side effect Medication side effect  Medication side effect   ?Follow up Falls evaluation completed;Education provided;Falls prevention discussed Falls evaluation completed;Education provided;Falls prevention discussed  Falls evaluation completed;Education provided;Falls prevention discussed   ? ? ?FALL RISK PREVENTION PERTAINING TO THE HOME: ? ?Any stairs in or around the home? No  ?If so, are there any without handrails?  N/a ?Home free of loose throw rugs in walkways, pet beds, electrical cords, etc? Yes  ?Adequate lighting in your home to reduce risk of falls? Yes  ? ?ASSISTIVE DEVICES UTILIZED TO PREVENT FALLS: ? ?Life alert? No  ?Use of a cane, walker or w/c? No  ?Grab bars in the bathroom? Yes  ?Shower chair or bench in shower? No  ?Elevated toilet seat or a handicapped toilet? Yes  ? ?TIMED UP AND GO: ? ?Was the test performed? No .  ? ? ?Gait steady and fast without use of assistive device ? ?Cognitive Function: ?  ?  ? ?  03/23/2022  ? 12:23 PM 03/17/2021  ?  4:03 PM 02/12/2020  ?  3:19 PM 01/23/2019  ?  3:04 PM  ?6CIT Screen  ?What Year? 0 points 0 points 0 points 0 points  ?What month? 0 points 0 points  0 points 0 points  ?What time? 0 points 0 points 0 points 0 points  ?Count back from 20 0 points 0 points 0 points 0 points  ?Months in reverse 0 points 0 points 0 points 0 points  ?Repeat phrase 0 points 4

## 2022-04-01 DIAGNOSIS — M9904 Segmental and somatic dysfunction of sacral region: Secondary | ICD-10-CM | POA: Diagnosis not present

## 2022-04-01 DIAGNOSIS — M48061 Spinal stenosis, lumbar region without neurogenic claudication: Secondary | ICD-10-CM | POA: Diagnosis not present

## 2022-04-01 DIAGNOSIS — S29012A Strain of muscle and tendon of back wall of thorax, initial encounter: Secondary | ICD-10-CM | POA: Diagnosis not present

## 2022-04-01 DIAGNOSIS — M5136 Other intervertebral disc degeneration, lumbar region: Secondary | ICD-10-CM | POA: Diagnosis not present

## 2022-04-01 DIAGNOSIS — M9903 Segmental and somatic dysfunction of lumbar region: Secondary | ICD-10-CM | POA: Diagnosis not present

## 2022-04-01 DIAGNOSIS — M9902 Segmental and somatic dysfunction of thoracic region: Secondary | ICD-10-CM | POA: Diagnosis not present

## 2022-04-12 DIAGNOSIS — Z1211 Encounter for screening for malignant neoplasm of colon: Secondary | ICD-10-CM | POA: Diagnosis not present

## 2022-04-12 DIAGNOSIS — K219 Gastro-esophageal reflux disease without esophagitis: Secondary | ICD-10-CM | POA: Diagnosis not present

## 2022-04-12 DIAGNOSIS — E669 Obesity, unspecified: Secondary | ICD-10-CM | POA: Diagnosis not present

## 2022-04-12 DIAGNOSIS — E785 Hyperlipidemia, unspecified: Secondary | ICD-10-CM | POA: Diagnosis not present

## 2022-04-12 DIAGNOSIS — I10 Essential (primary) hypertension: Secondary | ICD-10-CM | POA: Diagnosis not present

## 2022-04-15 ENCOUNTER — Other Ambulatory Visit: Payer: Self-pay | Admitting: Nurse Practitioner

## 2022-04-15 DIAGNOSIS — E2839 Other primary ovarian failure: Secondary | ICD-10-CM

## 2022-05-03 DIAGNOSIS — Z1211 Encounter for screening for malignant neoplasm of colon: Secondary | ICD-10-CM | POA: Diagnosis not present

## 2022-05-03 LAB — HM COLONOSCOPY

## 2022-05-05 DIAGNOSIS — M9902 Segmental and somatic dysfunction of thoracic region: Secondary | ICD-10-CM | POA: Diagnosis not present

## 2022-05-05 DIAGNOSIS — M48061 Spinal stenosis, lumbar region without neurogenic claudication: Secondary | ICD-10-CM | POA: Diagnosis not present

## 2022-05-05 DIAGNOSIS — M9903 Segmental and somatic dysfunction of lumbar region: Secondary | ICD-10-CM | POA: Diagnosis not present

## 2022-05-05 DIAGNOSIS — M5136 Other intervertebral disc degeneration, lumbar region: Secondary | ICD-10-CM | POA: Diagnosis not present

## 2022-05-05 DIAGNOSIS — S29012A Strain of muscle and tendon of back wall of thorax, initial encounter: Secondary | ICD-10-CM | POA: Diagnosis not present

## 2022-05-05 DIAGNOSIS — M9904 Segmental and somatic dysfunction of sacral region: Secondary | ICD-10-CM | POA: Diagnosis not present

## 2022-06-08 DIAGNOSIS — M48061 Spinal stenosis, lumbar region without neurogenic claudication: Secondary | ICD-10-CM | POA: Diagnosis not present

## 2022-06-08 DIAGNOSIS — S29012A Strain of muscle and tendon of back wall of thorax, initial encounter: Secondary | ICD-10-CM | POA: Diagnosis not present

## 2022-06-08 DIAGNOSIS — M9902 Segmental and somatic dysfunction of thoracic region: Secondary | ICD-10-CM | POA: Diagnosis not present

## 2022-06-08 DIAGNOSIS — M9904 Segmental and somatic dysfunction of sacral region: Secondary | ICD-10-CM | POA: Diagnosis not present

## 2022-06-08 DIAGNOSIS — M9903 Segmental and somatic dysfunction of lumbar region: Secondary | ICD-10-CM | POA: Diagnosis not present

## 2022-06-08 DIAGNOSIS — M5136 Other intervertebral disc degeneration, lumbar region: Secondary | ICD-10-CM | POA: Diagnosis not present

## 2022-07-01 ENCOUNTER — Other Ambulatory Visit: Payer: Self-pay | Admitting: Nurse Practitioner

## 2022-07-01 DIAGNOSIS — I10 Essential (primary) hypertension: Secondary | ICD-10-CM

## 2022-07-01 DIAGNOSIS — E782 Mixed hyperlipidemia: Secondary | ICD-10-CM

## 2022-07-06 DIAGNOSIS — M48061 Spinal stenosis, lumbar region without neurogenic claudication: Secondary | ICD-10-CM | POA: Diagnosis not present

## 2022-07-06 DIAGNOSIS — S29012A Strain of muscle and tendon of back wall of thorax, initial encounter: Secondary | ICD-10-CM | POA: Diagnosis not present

## 2022-07-06 DIAGNOSIS — M5136 Other intervertebral disc degeneration, lumbar region: Secondary | ICD-10-CM | POA: Diagnosis not present

## 2022-07-06 DIAGNOSIS — M9902 Segmental and somatic dysfunction of thoracic region: Secondary | ICD-10-CM | POA: Diagnosis not present

## 2022-07-06 DIAGNOSIS — M9904 Segmental and somatic dysfunction of sacral region: Secondary | ICD-10-CM | POA: Diagnosis not present

## 2022-07-06 DIAGNOSIS — M9903 Segmental and somatic dysfunction of lumbar region: Secondary | ICD-10-CM | POA: Diagnosis not present

## 2022-07-08 ENCOUNTER — Encounter: Payer: Self-pay | Admitting: Nurse Practitioner

## 2022-07-08 DIAGNOSIS — M1990 Unspecified osteoarthritis, unspecified site: Secondary | ICD-10-CM | POA: Diagnosis not present

## 2022-07-08 DIAGNOSIS — Z6830 Body mass index (BMI) 30.0-30.9, adult: Secondary | ICD-10-CM | POA: Diagnosis not present

## 2022-07-08 DIAGNOSIS — E785 Hyperlipidemia, unspecified: Secondary | ICD-10-CM | POA: Diagnosis not present

## 2022-07-08 DIAGNOSIS — K219 Gastro-esophageal reflux disease without esophagitis: Secondary | ICD-10-CM | POA: Diagnosis not present

## 2022-07-08 DIAGNOSIS — M810 Age-related osteoporosis without current pathological fracture: Secondary | ICD-10-CM | POA: Diagnosis not present

## 2022-07-08 DIAGNOSIS — N182 Chronic kidney disease, stage 2 (mild): Secondary | ICD-10-CM | POA: Diagnosis not present

## 2022-07-08 DIAGNOSIS — E669 Obesity, unspecified: Secondary | ICD-10-CM | POA: Diagnosis not present

## 2022-07-08 DIAGNOSIS — I129 Hypertensive chronic kidney disease with stage 1 through stage 4 chronic kidney disease, or unspecified chronic kidney disease: Secondary | ICD-10-CM | POA: Diagnosis not present

## 2022-07-08 DIAGNOSIS — Z7983 Long term (current) use of bisphosphonates: Secondary | ICD-10-CM | POA: Diagnosis not present

## 2022-07-11 DIAGNOSIS — M9903 Segmental and somatic dysfunction of lumbar region: Secondary | ICD-10-CM | POA: Diagnosis not present

## 2022-07-11 DIAGNOSIS — M9902 Segmental and somatic dysfunction of thoracic region: Secondary | ICD-10-CM | POA: Diagnosis not present

## 2022-07-11 DIAGNOSIS — M5136 Other intervertebral disc degeneration, lumbar region: Secondary | ICD-10-CM | POA: Diagnosis not present

## 2022-07-11 DIAGNOSIS — M9904 Segmental and somatic dysfunction of sacral region: Secondary | ICD-10-CM | POA: Diagnosis not present

## 2022-07-11 DIAGNOSIS — M48061 Spinal stenosis, lumbar region without neurogenic claudication: Secondary | ICD-10-CM | POA: Diagnosis not present

## 2022-07-11 DIAGNOSIS — S29012A Strain of muscle and tendon of back wall of thorax, initial encounter: Secondary | ICD-10-CM | POA: Diagnosis not present

## 2022-07-13 DIAGNOSIS — M9903 Segmental and somatic dysfunction of lumbar region: Secondary | ICD-10-CM | POA: Diagnosis not present

## 2022-07-13 DIAGNOSIS — M9902 Segmental and somatic dysfunction of thoracic region: Secondary | ICD-10-CM | POA: Diagnosis not present

## 2022-07-13 DIAGNOSIS — M48061 Spinal stenosis, lumbar region without neurogenic claudication: Secondary | ICD-10-CM | POA: Diagnosis not present

## 2022-07-13 DIAGNOSIS — S29012A Strain of muscle and tendon of back wall of thorax, initial encounter: Secondary | ICD-10-CM | POA: Diagnosis not present

## 2022-07-13 DIAGNOSIS — M5136 Other intervertebral disc degeneration, lumbar region: Secondary | ICD-10-CM | POA: Diagnosis not present

## 2022-07-13 DIAGNOSIS — M9904 Segmental and somatic dysfunction of sacral region: Secondary | ICD-10-CM | POA: Diagnosis not present

## 2022-07-18 DIAGNOSIS — M9903 Segmental and somatic dysfunction of lumbar region: Secondary | ICD-10-CM | POA: Diagnosis not present

## 2022-07-18 DIAGNOSIS — S29012A Strain of muscle and tendon of back wall of thorax, initial encounter: Secondary | ICD-10-CM | POA: Diagnosis not present

## 2022-07-18 DIAGNOSIS — M9902 Segmental and somatic dysfunction of thoracic region: Secondary | ICD-10-CM | POA: Diagnosis not present

## 2022-07-18 DIAGNOSIS — M48061 Spinal stenosis, lumbar region without neurogenic claudication: Secondary | ICD-10-CM | POA: Diagnosis not present

## 2022-07-18 DIAGNOSIS — M9904 Segmental and somatic dysfunction of sacral region: Secondary | ICD-10-CM | POA: Diagnosis not present

## 2022-07-18 DIAGNOSIS — M5136 Other intervertebral disc degeneration, lumbar region: Secondary | ICD-10-CM | POA: Diagnosis not present

## 2022-07-20 DIAGNOSIS — S29012A Strain of muscle and tendon of back wall of thorax, initial encounter: Secondary | ICD-10-CM | POA: Diagnosis not present

## 2022-07-20 DIAGNOSIS — M9904 Segmental and somatic dysfunction of sacral region: Secondary | ICD-10-CM | POA: Diagnosis not present

## 2022-07-20 DIAGNOSIS — M9903 Segmental and somatic dysfunction of lumbar region: Secondary | ICD-10-CM | POA: Diagnosis not present

## 2022-07-20 DIAGNOSIS — M9902 Segmental and somatic dysfunction of thoracic region: Secondary | ICD-10-CM | POA: Diagnosis not present

## 2022-07-20 DIAGNOSIS — M48061 Spinal stenosis, lumbar region without neurogenic claudication: Secondary | ICD-10-CM | POA: Diagnosis not present

## 2022-07-20 DIAGNOSIS — M5136 Other intervertebral disc degeneration, lumbar region: Secondary | ICD-10-CM | POA: Diagnosis not present

## 2022-07-25 DIAGNOSIS — M9903 Segmental and somatic dysfunction of lumbar region: Secondary | ICD-10-CM | POA: Diagnosis not present

## 2022-07-25 DIAGNOSIS — M48061 Spinal stenosis, lumbar region without neurogenic claudication: Secondary | ICD-10-CM | POA: Diagnosis not present

## 2022-07-25 DIAGNOSIS — M9904 Segmental and somatic dysfunction of sacral region: Secondary | ICD-10-CM | POA: Diagnosis not present

## 2022-07-25 DIAGNOSIS — S29012A Strain of muscle and tendon of back wall of thorax, initial encounter: Secondary | ICD-10-CM | POA: Diagnosis not present

## 2022-07-25 DIAGNOSIS — M9902 Segmental and somatic dysfunction of thoracic region: Secondary | ICD-10-CM | POA: Diagnosis not present

## 2022-07-25 DIAGNOSIS — M5136 Other intervertebral disc degeneration, lumbar region: Secondary | ICD-10-CM | POA: Diagnosis not present

## 2022-07-27 DIAGNOSIS — M48061 Spinal stenosis, lumbar region without neurogenic claudication: Secondary | ICD-10-CM | POA: Diagnosis not present

## 2022-07-27 DIAGNOSIS — S138XXA Sprain of joints and ligaments of other parts of neck, initial encounter: Secondary | ICD-10-CM | POA: Diagnosis not present

## 2022-07-27 DIAGNOSIS — S29012A Strain of muscle and tendon of back wall of thorax, initial encounter: Secondary | ICD-10-CM | POA: Diagnosis not present

## 2022-07-27 DIAGNOSIS — M5136 Other intervertebral disc degeneration, lumbar region: Secondary | ICD-10-CM | POA: Diagnosis not present

## 2022-07-27 DIAGNOSIS — M9904 Segmental and somatic dysfunction of sacral region: Secondary | ICD-10-CM | POA: Diagnosis not present

## 2022-07-27 DIAGNOSIS — M9903 Segmental and somatic dysfunction of lumbar region: Secondary | ICD-10-CM | POA: Diagnosis not present

## 2022-07-27 DIAGNOSIS — M9901 Segmental and somatic dysfunction of cervical region: Secondary | ICD-10-CM | POA: Diagnosis not present

## 2022-07-27 DIAGNOSIS — M9902 Segmental and somatic dysfunction of thoracic region: Secondary | ICD-10-CM | POA: Diagnosis not present

## 2022-08-03 DIAGNOSIS — M48061 Spinal stenosis, lumbar region without neurogenic claudication: Secondary | ICD-10-CM | POA: Diagnosis not present

## 2022-08-03 DIAGNOSIS — S29012A Strain of muscle and tendon of back wall of thorax, initial encounter: Secondary | ICD-10-CM | POA: Diagnosis not present

## 2022-08-03 DIAGNOSIS — M5136 Other intervertebral disc degeneration, lumbar region: Secondary | ICD-10-CM | POA: Diagnosis not present

## 2022-08-03 DIAGNOSIS — M9904 Segmental and somatic dysfunction of sacral region: Secondary | ICD-10-CM | POA: Diagnosis not present

## 2022-08-03 DIAGNOSIS — M9901 Segmental and somatic dysfunction of cervical region: Secondary | ICD-10-CM | POA: Diagnosis not present

## 2022-08-03 DIAGNOSIS — M9903 Segmental and somatic dysfunction of lumbar region: Secondary | ICD-10-CM | POA: Diagnosis not present

## 2022-08-03 DIAGNOSIS — S138XXA Sprain of joints and ligaments of other parts of neck, initial encounter: Secondary | ICD-10-CM | POA: Diagnosis not present

## 2022-08-03 DIAGNOSIS — M9902 Segmental and somatic dysfunction of thoracic region: Secondary | ICD-10-CM | POA: Diagnosis not present

## 2022-08-05 DIAGNOSIS — M5136 Other intervertebral disc degeneration, lumbar region: Secondary | ICD-10-CM | POA: Diagnosis not present

## 2022-08-05 DIAGNOSIS — M9904 Segmental and somatic dysfunction of sacral region: Secondary | ICD-10-CM | POA: Diagnosis not present

## 2022-08-05 DIAGNOSIS — M9903 Segmental and somatic dysfunction of lumbar region: Secondary | ICD-10-CM | POA: Diagnosis not present

## 2022-08-05 DIAGNOSIS — S138XXA Sprain of joints and ligaments of other parts of neck, initial encounter: Secondary | ICD-10-CM | POA: Diagnosis not present

## 2022-08-05 DIAGNOSIS — S29012A Strain of muscle and tendon of back wall of thorax, initial encounter: Secondary | ICD-10-CM | POA: Diagnosis not present

## 2022-08-05 DIAGNOSIS — M9901 Segmental and somatic dysfunction of cervical region: Secondary | ICD-10-CM | POA: Diagnosis not present

## 2022-08-05 DIAGNOSIS — M48061 Spinal stenosis, lumbar region without neurogenic claudication: Secondary | ICD-10-CM | POA: Diagnosis not present

## 2022-08-05 DIAGNOSIS — M9902 Segmental and somatic dysfunction of thoracic region: Secondary | ICD-10-CM | POA: Diagnosis not present

## 2022-08-10 DIAGNOSIS — M9903 Segmental and somatic dysfunction of lumbar region: Secondary | ICD-10-CM | POA: Diagnosis not present

## 2022-08-10 DIAGNOSIS — S138XXA Sprain of joints and ligaments of other parts of neck, initial encounter: Secondary | ICD-10-CM | POA: Diagnosis not present

## 2022-08-10 DIAGNOSIS — S29012A Strain of muscle and tendon of back wall of thorax, initial encounter: Secondary | ICD-10-CM | POA: Diagnosis not present

## 2022-08-10 DIAGNOSIS — M9902 Segmental and somatic dysfunction of thoracic region: Secondary | ICD-10-CM | POA: Diagnosis not present

## 2022-08-10 DIAGNOSIS — M9901 Segmental and somatic dysfunction of cervical region: Secondary | ICD-10-CM | POA: Diagnosis not present

## 2022-08-10 DIAGNOSIS — M9904 Segmental and somatic dysfunction of sacral region: Secondary | ICD-10-CM | POA: Diagnosis not present

## 2022-08-10 DIAGNOSIS — M48061 Spinal stenosis, lumbar region without neurogenic claudication: Secondary | ICD-10-CM | POA: Diagnosis not present

## 2022-08-10 DIAGNOSIS — M5136 Other intervertebral disc degeneration, lumbar region: Secondary | ICD-10-CM | POA: Diagnosis not present

## 2022-08-17 DIAGNOSIS — M5136 Other intervertebral disc degeneration, lumbar region: Secondary | ICD-10-CM | POA: Diagnosis not present

## 2022-08-17 DIAGNOSIS — M9902 Segmental and somatic dysfunction of thoracic region: Secondary | ICD-10-CM | POA: Diagnosis not present

## 2022-08-17 DIAGNOSIS — S29012A Strain of muscle and tendon of back wall of thorax, initial encounter: Secondary | ICD-10-CM | POA: Diagnosis not present

## 2022-08-17 DIAGNOSIS — M9903 Segmental and somatic dysfunction of lumbar region: Secondary | ICD-10-CM | POA: Diagnosis not present

## 2022-08-17 DIAGNOSIS — M48061 Spinal stenosis, lumbar region without neurogenic claudication: Secondary | ICD-10-CM | POA: Diagnosis not present

## 2022-08-17 DIAGNOSIS — M9901 Segmental and somatic dysfunction of cervical region: Secondary | ICD-10-CM | POA: Diagnosis not present

## 2022-08-17 DIAGNOSIS — M9904 Segmental and somatic dysfunction of sacral region: Secondary | ICD-10-CM | POA: Diagnosis not present

## 2022-08-17 DIAGNOSIS — S138XXA Sprain of joints and ligaments of other parts of neck, initial encounter: Secondary | ICD-10-CM | POA: Diagnosis not present

## 2022-08-20 ENCOUNTER — Encounter: Payer: Self-pay | Admitting: Nurse Practitioner

## 2022-08-21 IMAGING — DX DG LUMBAR SPINE COMPLETE 4+V
5 series · 5 of 5 positions shown · non-contrast
Comparison: 04/16/2021

CLINICAL DATA: Chronic back pain

EXAM:
LUMBAR SPINE - COMPLETE 4+ VIEW

[l-spine ap]
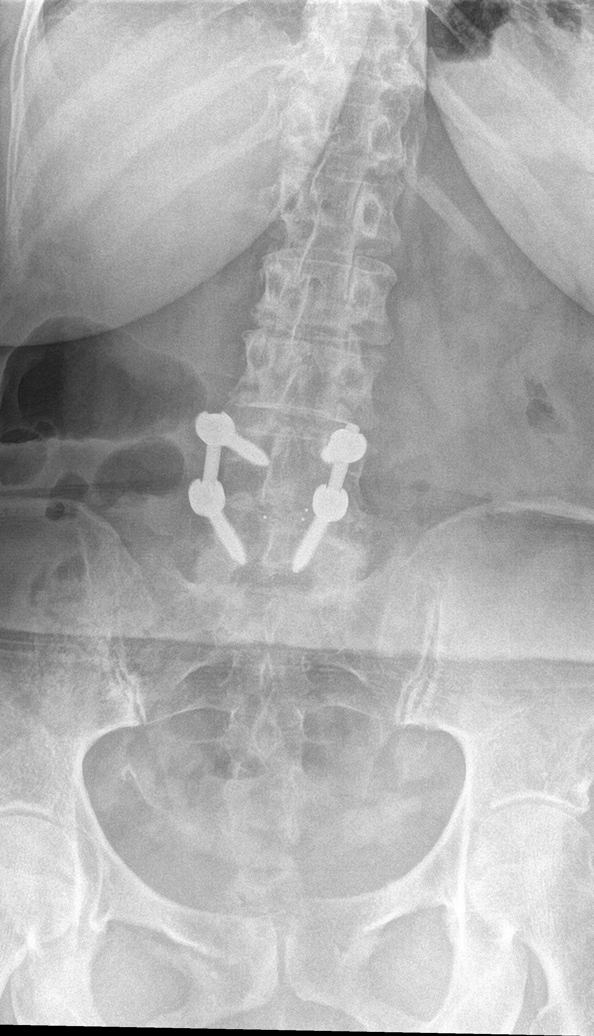

[l-spine obl (1 of 3)]
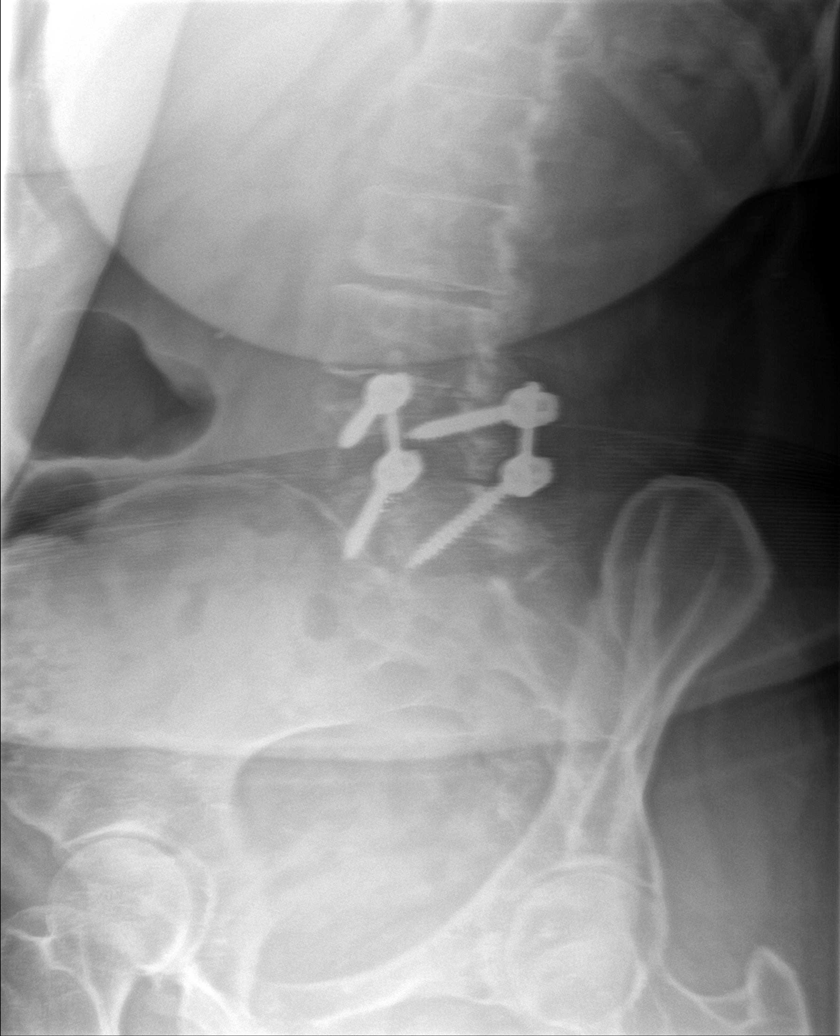

[l-spine obl (2 of 3)]
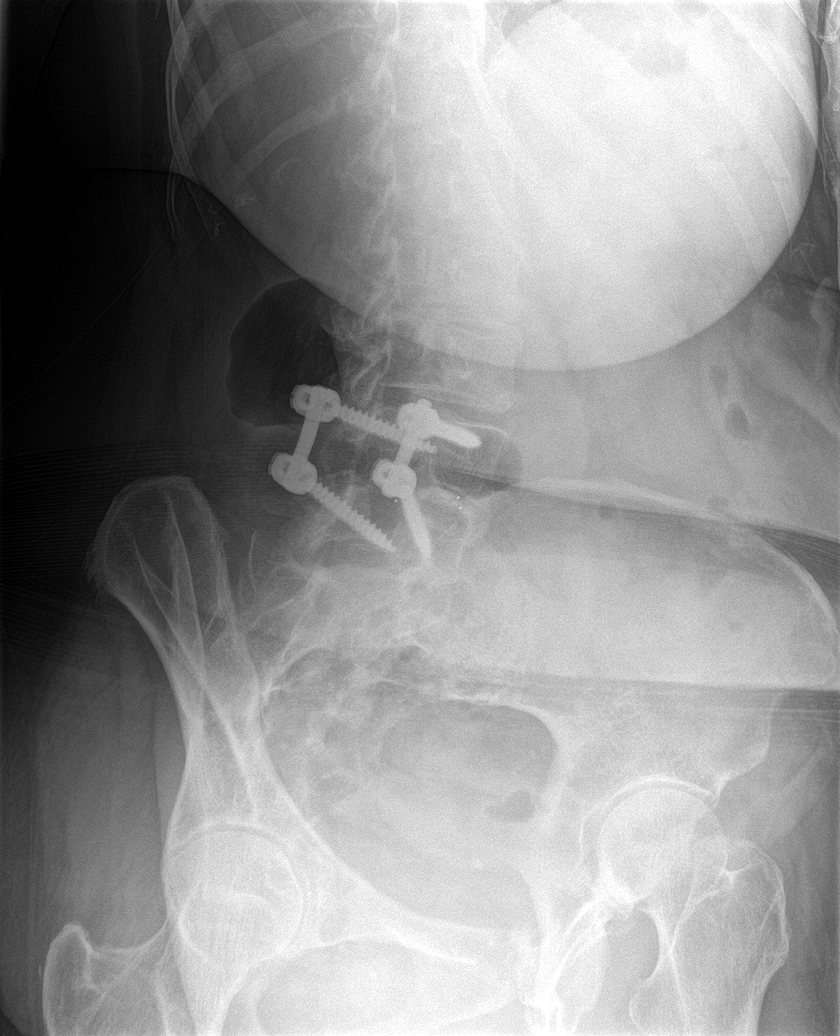

[l-spine lat]
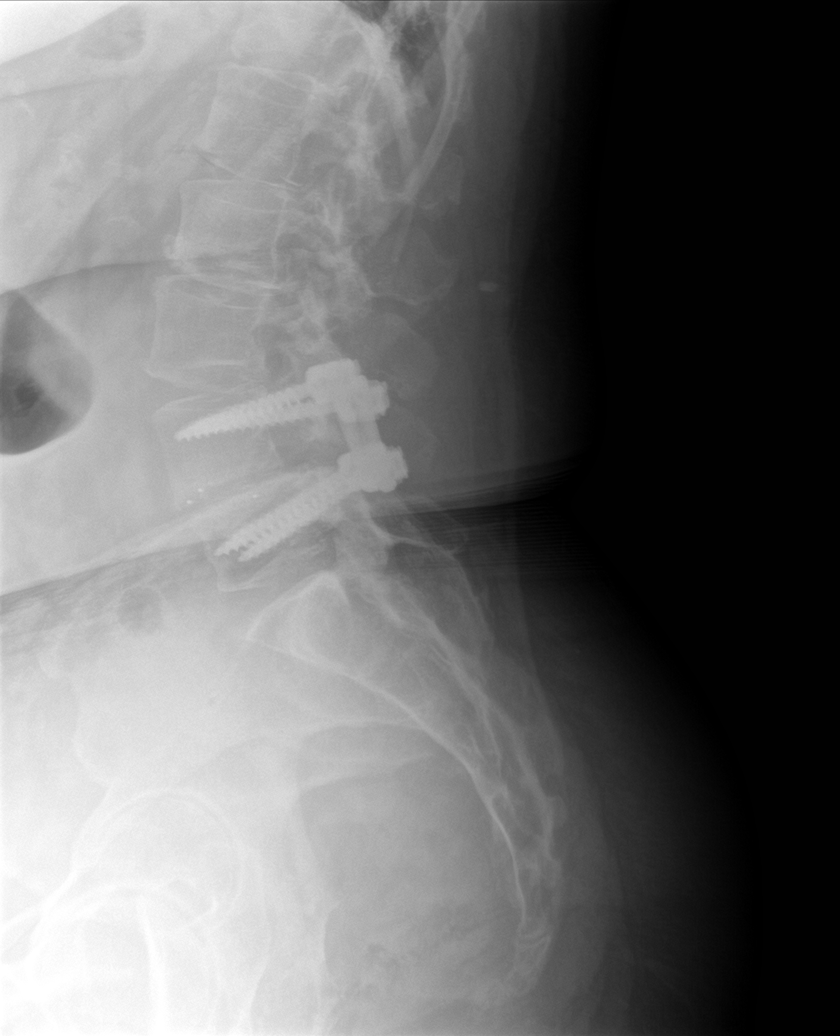

[l-spine obl (3 of 3)]
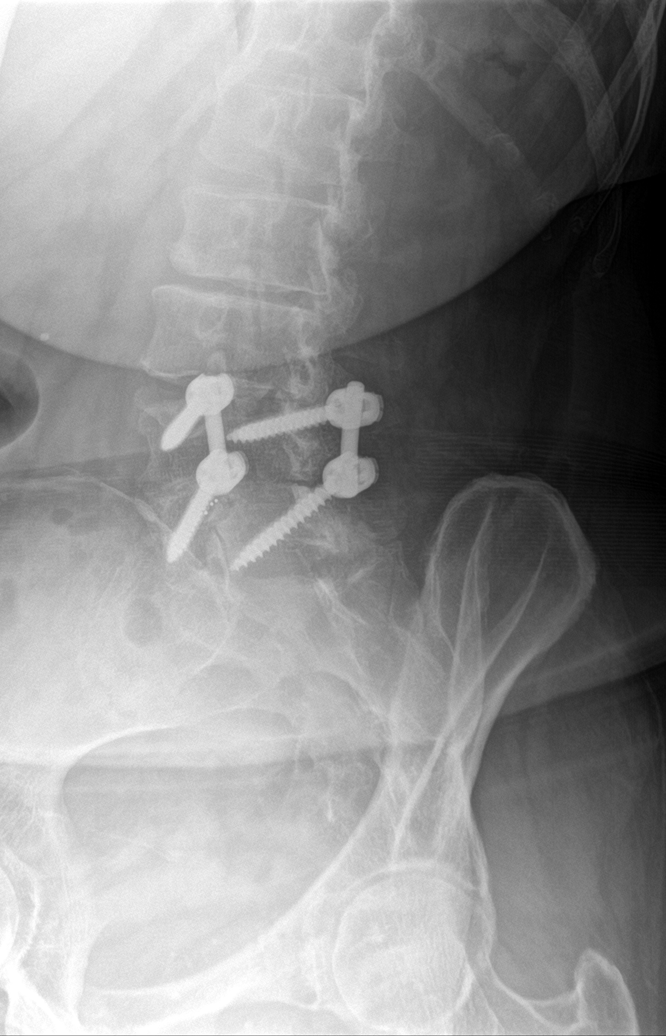

[5 of 5 positions shown; findings below may reference images not displayed]

FINDINGS: Postsurgical changes are noted at L4-5 with interbody fusion and
posterior fixation. Some mild anterolisthesis of L4 on L5 is noted
stable from the prior exam. No pars defects are seen. No other
anterolisthesis is noted.
IMPRESSION: Postsurgical changes as described stable from the prior study. No
acute abnormality is noted.

## 2022-08-22 DIAGNOSIS — S29012A Strain of muscle and tendon of back wall of thorax, initial encounter: Secondary | ICD-10-CM | POA: Diagnosis not present

## 2022-08-22 DIAGNOSIS — M9901 Segmental and somatic dysfunction of cervical region: Secondary | ICD-10-CM | POA: Diagnosis not present

## 2022-08-22 DIAGNOSIS — M48061 Spinal stenosis, lumbar region without neurogenic claudication: Secondary | ICD-10-CM | POA: Diagnosis not present

## 2022-08-22 DIAGNOSIS — S138XXA Sprain of joints and ligaments of other parts of neck, initial encounter: Secondary | ICD-10-CM | POA: Diagnosis not present

## 2022-08-22 DIAGNOSIS — M5136 Other intervertebral disc degeneration, lumbar region: Secondary | ICD-10-CM | POA: Diagnosis not present

## 2022-08-22 DIAGNOSIS — M9902 Segmental and somatic dysfunction of thoracic region: Secondary | ICD-10-CM | POA: Diagnosis not present

## 2022-08-22 DIAGNOSIS — M9903 Segmental and somatic dysfunction of lumbar region: Secondary | ICD-10-CM | POA: Diagnosis not present

## 2022-08-22 DIAGNOSIS — M9904 Segmental and somatic dysfunction of sacral region: Secondary | ICD-10-CM | POA: Diagnosis not present

## 2022-09-05 DIAGNOSIS — M9902 Segmental and somatic dysfunction of thoracic region: Secondary | ICD-10-CM | POA: Diagnosis not present

## 2022-09-05 DIAGNOSIS — M9904 Segmental and somatic dysfunction of sacral region: Secondary | ICD-10-CM | POA: Diagnosis not present

## 2022-09-05 DIAGNOSIS — S138XXA Sprain of joints and ligaments of other parts of neck, initial encounter: Secondary | ICD-10-CM | POA: Diagnosis not present

## 2022-09-05 DIAGNOSIS — M9901 Segmental and somatic dysfunction of cervical region: Secondary | ICD-10-CM | POA: Diagnosis not present

## 2022-09-05 DIAGNOSIS — M5136 Other intervertebral disc degeneration, lumbar region: Secondary | ICD-10-CM | POA: Diagnosis not present

## 2022-09-05 DIAGNOSIS — M9903 Segmental and somatic dysfunction of lumbar region: Secondary | ICD-10-CM | POA: Diagnosis not present

## 2022-09-05 DIAGNOSIS — S29012A Strain of muscle and tendon of back wall of thorax, initial encounter: Secondary | ICD-10-CM | POA: Diagnosis not present

## 2022-09-05 DIAGNOSIS — M48061 Spinal stenosis, lumbar region without neurogenic claudication: Secondary | ICD-10-CM | POA: Diagnosis not present

## 2022-09-07 ENCOUNTER — Ambulatory Visit: Payer: Medicare PPO | Admitting: Nurse Practitioner

## 2022-09-07 ENCOUNTER — Encounter: Payer: Self-pay | Admitting: Nurse Practitioner

## 2022-09-07 VITALS — BP 120/70 | HR 84 | Temp 98.0°F | Ht 67.0 in | Wt 196.4 lb

## 2022-09-07 DIAGNOSIS — E6609 Other obesity due to excess calories: Secondary | ICD-10-CM

## 2022-09-07 DIAGNOSIS — I129 Hypertensive chronic kidney disease with stage 1 through stage 4 chronic kidney disease, or unspecified chronic kidney disease: Secondary | ICD-10-CM | POA: Diagnosis not present

## 2022-09-07 DIAGNOSIS — N183 Chronic kidney disease, stage 3 unspecified: Secondary | ICD-10-CM

## 2022-09-07 DIAGNOSIS — Z6830 Body mass index (BMI) 30.0-30.9, adult: Secondary | ICD-10-CM

## 2022-09-07 DIAGNOSIS — E782 Mixed hyperlipidemia: Secondary | ICD-10-CM

## 2022-09-07 DIAGNOSIS — E2839 Other primary ovarian failure: Secondary | ICD-10-CM

## 2022-09-07 DIAGNOSIS — Z23 Encounter for immunization: Secondary | ICD-10-CM

## 2022-09-07 NOTE — Patient Instructions (Addendum)
Hypertension, Adult High blood pressure (hypertension) is when the force of blood pumping through the arteries is too strong. The arteries are the blood vessels that carry blood from the heart throughout the body. Hypertension forces the heart to work harder to pump blood and may cause arteries to become narrow or stiff. Untreated or uncontrolled hypertension can lead to a heart attack, heart failure, a stroke, kidney disease, and other problems. A blood pressure reading consists of a higher number over a lower number. Ideally, your blood pressure should be below 120/80. The first ("top") number is called the systolic pressure. It is a measure of the pressure in your arteries as your heart beats. The second ("bottom") number is called the diastolic pressure. It is a measure of the pressure in your arteries as the heart relaxes. What are the causes? The exact cause of this condition is not known. There are some conditions that result in high blood pressure. What increases the risk? Certain factors may make you more likely to develop high blood pressure. Some of these risk factors are under your control, including: Smoking. Not getting enough exercise or physical activity. Being overweight. Having too much fat, sugar, calories, or salt (sodium) in your diet. Drinking too much alcohol. Other risk factors include: Having a personal history of heart disease, diabetes, high cholesterol, or kidney disease. Stress. Having a family history of high blood pressure and high cholesterol. Having obstructive sleep apnea. Age. The risk increases with age. What are the signs or symptoms? High blood pressure may not cause symptoms. Very high blood pressure (hypertensive crisis) may cause: Headache. Fast or irregular heartbeats (palpitations). Shortness of breath. Nosebleed. Nausea and vomiting. Vision changes. Severe chest pain, dizziness, and seizures. How is this diagnosed? This condition is diagnosed by  measuring your blood pressure while you are seated, with your arm resting on a flat surface, your legs uncrossed, and your feet flat on the floor. The cuff of the blood pressure monitor will be placed directly against the skin of your upper arm at the level of your heart. Blood pressure should be measured at least twice using the same arm. Certain conditions can cause a difference in blood pressure between your right and left arms. If you have a high blood pressure reading during one visit or you have normal blood pressure with other risk factors, you may be asked to: Return on a different day to have your blood pressure checked again. Monitor your blood pressure at home for 1 week or longer. If you are diagnosed with hypertension, you may have other blood or imaging tests to help your health care provider understand your overall risk for other conditions. How is this treated? This condition is treated by making healthy lifestyle changes, such as eating healthy foods, exercising more, and reducing your alcohol intake. You may be referred for counseling on a healthy diet and physical activity. Your health care provider may prescribe medicine if lifestyle changes are not enough to get your blood pressure under control and if: Your systolic blood pressure is above 130. Your diastolic blood pressure is above 80. Your personal target blood pressure may vary depending on your medical conditions, your age, and other factors. Follow these instructions at home: Eating and drinking  Eat a diet that is high in fiber and potassium, and low in sodium, added sugar, and fat. An example of this eating plan is called the DASH diet. DASH stands for Dietary Approaches to Stop Hypertension. To eat this way: Eat   plenty of fresh fruits and vegetables. Try to fill one half of your plate at each meal with fruits and vegetables. Eat whole grains, such as whole-wheat pasta, brown rice, or whole-grain bread. Fill about one  fourth of your plate with whole grains. Eat or drink low-fat dairy products, such as skim milk or low-fat yogurt. Avoid fatty cuts of meat, processed or cured meats, and poultry with skin. Fill about one fourth of your plate with lean proteins, such as fish, chicken without skin, beans, eggs, or tofu. Avoid pre-made and processed foods. These tend to be higher in sodium, added sugar, and fat. Reduce your daily sodium intake. Many people with hypertension should eat less than 1,500 mg of sodium a day. Do not drink alcohol if: Your health care provider tells you not to drink. You are pregnant, may be pregnant, or are planning to become pregnant. If you drink alcohol: Limit how much you have to: 0-1 drink a day for women. 0-2 drinks a day for men. Know how much alcohol is in your drink. In the [place] one drink equals one 12 oz bottle of beer (355 mL), one 5 oz glass of wine (148 mL), or one 1 oz glass of hard liquor (44 mL). Lifestyle  Work with your health care provider to maintain a healthy body weight or to lose weight. Ask what an ideal weight is for you. Get at least 30 minutes of exercise that causes your heart to beat faster (aerobic exercise) most days of the week. Activities may include walking, swimming, or biking. Include exercise to strengthen your muscles (resistance exercise), such as Pilates or lifting weights, as part of your weekly exercise routine. Try to do these types of exercises for 30 minutes at least 3 days a week. Do not use any products that contain nicotine or tobacco. These products include cigarettes, chewing tobacco, and vaping devices, such as e-cigarettes. If you need help quitting, ask your health care provider. Monitor your blood pressure at home as told by your health care provider. Keep all follow-up visits. This is important. Medicines Take over-the-counter and prescription medicines only as told by your health care provider. Follow directions carefully. Blood  pressure medicines must be taken as prescribed. Do not skip doses of blood pressure medicine. Doing this puts you at risk for problems and can make the medicine less effective. Ask your health care provider about side effects or reactions to medicines that you should watch for. Contact a health care provider if you: Think you are having a reaction to a medicine you are taking. Have headaches that keep coming back (recurring). Feel dizzy. Have swelling in your ankles. Have trouble with your vision. Get help right away if you: Develop a severe headache or confusion. Have unusual weakness or numbness. Feel faint. Have severe pain in your chest or abdomen. Vomit repeatedly. Have trouble breathing. These symptoms may be an emergency. Get help right away. Call 911. Do not wait to see if the symptoms will go away. Do not drive yourself to the hospital. Summary Hypertension is when the force of blood pumping through your arteries is too strong. If this condition is not controlled, it may put you at risk for serious complications. Your personal target blood pressure may vary depending on your medical conditions, your age, and other factors. For most people, a normal blood pressure is less than 120/80. Hypertension is treated with lifestyle changes, medicines, or a combination of both. Lifestyle changes include losing weight, eating a healthy,   low-sodium diet, exercising more, and limiting alcohol. This information is not intended to replace advice given to you by your health care provider. Make sure you discuss any questions you have with your health care provider. Document Revised: 09/21/2021 Document Reviewed: 09/21/2021 Elsevier Patient Education  2023 Elsevier Inc.   Influenza (Flu) Vaccine (Inactivated or Recombinant): What You Need to Know 1. Why get vaccinated? Influenza vaccine can prevent influenza (flu). Flu is a contagious disease that spreads around the [place] every year,  usually between October and May. Anyone can get the flu, but it is more dangerous for some people. Infants and young children, people 65 years and older, pregnant people, and people with certain health conditions or a weakened immune system are at greatest risk of flu complications. Pneumonia, bronchitis, sinus infections, and ear infections are examples of flu-related complications. If you have a medical condition, such as heart disease, cancer, or diabetes, flu can make it worse. Flu can cause fever and chills, sore throat, muscle aches, fatigue, cough, headache, and runny or stuffy nose. Some people may have vomiting and diarrhea, though this is more common in children than adults. In an average year, thousands of people in the [place] die from flu, and many more are hospitalized. Flu vaccine prevents millions of illnesses and flu-related visits to the doctor each year. 2. Influenza vaccines CDC recommends everyone 6 months and older get vaccinated every flu season. Children 6 months through 8 years of age may need 2 doses during a single flu season. Everyone else needs only 1 dose each flu season. It takes about 2 weeks for protection to develop after vaccination. There are many flu viruses, and they are always changing. Each year a new flu vaccine is made to protect against the influenza viruses believed to be likely to cause disease in the upcoming flu season. Even when the vaccine doesn't exactly match these viruses, it may still provide some protection. Influenza vaccine does not cause flu. Influenza vaccine may be given at the same time as other vaccines. 3. Talk with your health care provider Tell your vaccination provider if the person getting the vaccine: Has had an allergic reaction after a previous dose of influenza vaccine, or has any severe, life-threatening allergies Has ever had Guillain-Barr Syndrome (also called "GBS") In some cases, your health care provider may decide to  postpone influenza vaccination until a future visit. Influenza vaccine can be administered at any time during pregnancy. People who are or will be pregnant during influenza season should receive inactivated influenza vaccine. People with minor illnesses, such as a cold, may be vaccinated. People who are moderately or severely ill should usually wait until they recover before getting influenza vaccine. Your health care provider can give you more information. 4. Risks of a vaccine reaction Soreness, redness, and swelling where the shot is given, fever, muscle aches, and headache can happen after influenza vaccination. There may be a very small increased risk of Guillain-Barr Syndrome (GBS) after inactivated influenza vaccine (the flu shot). Young children who get the flu shot along with pneumococcal vaccine (PCV13) and/or DTaP vaccine at the same time might be slightly more likely to have a seizure caused by fever. Tell your health care provider if a child who is getting flu vaccine has ever had a seizure. People sometimes faint after medical procedures, including vaccination. Tell your provider if you feel dizzy or have vision changes or ringing in the ears. As with any medicine, there is a very remote   chance of a vaccine causing a severe allergic reaction, other serious injury, or death. 5. What if there is a serious problem? An allergic reaction could occur after the vaccinated person leaves the clinic. If you see signs of a severe allergic reaction (hives, swelling of the face and throat, difficulty breathing, a fast heartbeat, dizziness, or weakness), call 9-1-1 and get the person to the nearest hospital. For other signs that concern you, call your health care provider. Adverse reactions should be reported to the Vaccine Adverse Event Reporting System (VAERS). Your health care provider will usually file this report, or you can do it yourself. Visit the VAERS website at www.vaers.hhs.gov or call  1-800-822-7967. VAERS is only for reporting reactions, and VAERS staff members do not give medical advice. 6. The National Vaccine Injury Compensation Program The National Vaccine Injury Compensation Program (VICP) is a federal program that was created to compensate people who may have been injured by certain vaccines. Claims regarding alleged injury or death due to vaccination have a time limit for filing, which may be as short as two years. Visit the VICP website at www.hrsa.gov/vaccinecompensation or call 1-800-338-2382 to learn about the program and about filing a claim. 7. How can I learn more? Ask your health care provider. Call your local or state health department. Visit the website of the Food and Drug Administration (FDA) for vaccine package inserts and additional information at www.fda.gov/vaccines-blood-biologics/vaccines. Contact the Centers for Disease Control and Prevention (CDC): Call 1-800-232-4636 (1-800-CDC-INFO) or Visit CDC's website at www.cdc.gov/flu. Source: CDC Vaccine Information Statement Inactivated Influenza Vaccine (07/03/2020) This same material is available at www.cdc.gov for no charge. This information is not intended to replace advice given to you by your health care provider. Make sure you discuss any questions you have with your health care provider. Document Revised: 10/13/2021 Document Reviewed: 08/05/2021 Elsevier Patient Education  2023 Elsevier Inc.  

## 2022-09-07 NOTE — Progress Notes (Signed)
I,Tianna Badgett,acting as a Education administrator for [employer], FNP.,have documented all relevant documentation on the behalf of [name], FNP,as directed by  [name], FNP while in the presence of [name], [place].  Subjective:     Patient ID: [name] , female    DOB: [birth year] , 71 y.o.   MRN: [id number]   Chief Complaint  Patient presents with   Hypertension     HPI  Patient present for [place].    She does go to the Chiropractor every 2 weeks. She had her Colonscopy in June with Dr. Collene Mares which was normal. [facility]   Hypertension This is a chronic problem. The current episode started more than 1 year ago. The problem is unchanged. The problem is controlled. Pertinent negatives include no anxiety, chest pain, headaches or palpitations. There are no associated agents to hypertension. Risk factors for coronary artery disease include obesity and sedentary lifestyle. Past treatments include diuretics and ACE inhibitors. The current treatment provides significant improvement. There are no compliance problems.  There is no history of angina. There is no history of chronic renal disease.     Past Medical History:  Diagnosis Date   Arthritis    Constipation    GERD (gastroesophageal reflux disease)    Hypertension      Family History  Problem Relation Age of Onset   Diabetes Mother    Heart attack Father    Cancer Brother    Anesthesia problems Neg Hx      Current Outpatient Medications:    alendronate (FOSAMAX) 70 MG tablet, TAKE 1 TABLET (70 MG TOTAL) BY MOUTH ONCE A WEEK. TAKE WITH A FULL GLASS OF WATER ON AN EMPTY STOMACH., Disp: 12 tablet, Rfl: 2   aspirin 81 MG tablet, Take 81 mg by mouth daily., Disp: , Rfl:    lisinopril-hydrochlorothiazide (ZESTORETIC) 20-12.5 MG tablet, TAKE 1 TABLET BY MOUTH EVERY Briana, Disp: 90 tablet, Rfl: 1   simvastatin (ZOCOR) 40 MG tablet, TAKE 1 TABLET BY MOUTH EVERY Briana, Disp: 90 tablet, Rfl: 1    dexlansoprazole (DEXILANT) 60 MG capsule, Take 60 mg by mouth daily. (Patient not taking: Reported on 09/07/2022), Disp: , Rfl:    gabapentin (NEURONTIN) 300 MG capsule, Take 300 mg by mouth at bedtime. (Patient not taking: Reported on 03/23/2022), Disp: , Rfl:    meloxicam (MOBIC) 15 MG tablet, Take 15 mg by mouth daily. (Patient not taking: Reported on 03/23/2022), Disp: , Rfl:    Allergies  Allergen Reactions   Penicillins Hives     Review of Systems  Constitutional: Negative.   Respiratory: Negative.    Cardiovascular: Negative.  Negative for chest pain and palpitations.  Gastrointestinal: Negative.   Neurological: Negative.  Negative for headaches.     Today's Vitals   09/07/22 0946  BP: 120/70  Pulse: 84  Temp: 98 F (36.7 C)  TempSrc: Oral  SpO2: 97%  Weight: 196 lb 6.4 oz (89.1 kg)  Height: _0  (1.702 m)   Body mass index is 30.76 kg/m.   Wt Readings from Last 3 Encounters:  09/07/22 196 lb 6.4 oz (89.1 kg)  03/23/22 200 lb 6.4 oz (90.9 kg)  02/22/22 203 lb (92.1 kg)     Objective:  Physical Exam Vitals reviewed.  Constitutional:      General: She is not in acute distress.    Appearance: Normal appearance.  Cardiovascular:     Rate and Rhythm: Normal rate and  regular rhythm.     Pulses: Normal pulses.     Heart sounds: Normal heart sounds. No murmur heard. Pulmonary:     Effort: Pulmonary effort is normal. No respiratory distress.     Breath sounds: Normal breath sounds. No wheezing.  Skin:    General: Skin is warm and dry.     Capillary Refill: Capillary refill takes less than 2 seconds.  Neurological:     General: No focal deficit present.     Mental Status: She is alert and oriented to person, place, and time.     Cranial Nerves: No cranial nerve deficit.     Motor: No weakness.  Psychiatric:        Mood and Affect: Mood normal.        Behavior: Behavior normal.        Thought Content: Thought content normal.        Judgment: Judgment normal.          Assessment And Plan:     1. Benign hypertension with CKD (chronic kidney disease) stage III (HCC) Blood pressure is well controlled, continue current medications. - BMP8+EGFR  2. Mixed hyperlipidemia Cholesterol levels are stable, continue statin, tolerating well - BMP8+EGFR - Lipid panel  3. Need for influenza vaccination Influenza vaccine administered Encouraged to take Tylenol as needed for fever or muscle aches. - Flu Vaccine QUAD High Dose(Fluad)  4. Class 1 obesity due to excess calories with serious comorbidity and body mass index (BMI) of 30.0 to 30.9 in adult She is encouraged to strive for BMI less than 30 to decrease cardiac risk. Advised to aim for at least 150 minutes of exercise per week. Congratulated on her 4 lb weight loss  5. Decreased estrogen level - DG Bone Density; Future  Patient was given opportunity to ask questions. Patient verbalized understanding of the plan and was able to repeat key elements of the plan. All questions were answered to their satisfaction.  [name], FNP   I, [name], FNP, have reviewed all documentation for this visit. The documentation on 09/07/22 for the exam, diagnosis, procedures, and orders are all accurate and complete.   IF YOU HAVE BEEN REFERRED TO A SPECIALIST, IT MAY TAKE 1-2 WEEKS TO SCHEDULE/PROCESS THE REFERRAL. IF YOU HAVE NOT HEARD FROM US/SPECIALIST IN TWO WEEKS, PLEASE GIVE [place] A CALL AT [phone].   THE PATIENT IS ENCOURAGED TO PRACTICE SOCIAL DISTANCING DUE TO THE COVID-19 PANDEMIC.

## 2022-09-19 DIAGNOSIS — M5136 Other intervertebral disc degeneration, lumbar region: Secondary | ICD-10-CM | POA: Diagnosis not present

## 2022-09-19 DIAGNOSIS — M9903 Segmental and somatic dysfunction of lumbar region: Secondary | ICD-10-CM | POA: Diagnosis not present

## 2022-09-19 DIAGNOSIS — S138XXA Sprain of joints and ligaments of other parts of neck, initial encounter: Secondary | ICD-10-CM | POA: Diagnosis not present

## 2022-09-19 DIAGNOSIS — M9901 Segmental and somatic dysfunction of cervical region: Secondary | ICD-10-CM | POA: Diagnosis not present

## 2022-09-19 DIAGNOSIS — M9902 Segmental and somatic dysfunction of thoracic region: Secondary | ICD-10-CM | POA: Diagnosis not present

## 2022-09-19 DIAGNOSIS — M9904 Segmental and somatic dysfunction of sacral region: Secondary | ICD-10-CM | POA: Diagnosis not present

## 2022-09-19 DIAGNOSIS — M48061 Spinal stenosis, lumbar region without neurogenic claudication: Secondary | ICD-10-CM | POA: Diagnosis not present

## 2022-09-19 DIAGNOSIS — S29012A Strain of muscle and tendon of back wall of thorax, initial encounter: Secondary | ICD-10-CM | POA: Diagnosis not present

## 2022-10-10 DIAGNOSIS — M9902 Segmental and somatic dysfunction of thoracic region: Secondary | ICD-10-CM | POA: Diagnosis not present

## 2022-10-10 DIAGNOSIS — S138XXA Sprain of joints and ligaments of other parts of neck, initial encounter: Secondary | ICD-10-CM | POA: Diagnosis not present

## 2022-10-10 DIAGNOSIS — M5136 Other intervertebral disc degeneration, lumbar region: Secondary | ICD-10-CM | POA: Diagnosis not present

## 2022-10-10 DIAGNOSIS — M9903 Segmental and somatic dysfunction of lumbar region: Secondary | ICD-10-CM | POA: Diagnosis not present

## 2022-10-10 DIAGNOSIS — M48061 Spinal stenosis, lumbar region without neurogenic claudication: Secondary | ICD-10-CM | POA: Diagnosis not present

## 2022-10-10 DIAGNOSIS — M9901 Segmental and somatic dysfunction of cervical region: Secondary | ICD-10-CM | POA: Diagnosis not present

## 2022-10-10 DIAGNOSIS — S29012A Strain of muscle and tendon of back wall of thorax, initial encounter: Secondary | ICD-10-CM | POA: Diagnosis not present

## 2022-10-10 DIAGNOSIS — M9904 Segmental and somatic dysfunction of sacral region: Secondary | ICD-10-CM | POA: Diagnosis not present

## 2022-10-25 DIAGNOSIS — R142 Eructation: Secondary | ICD-10-CM | POA: Diagnosis not present

## 2022-10-25 DIAGNOSIS — I10 Essential (primary) hypertension: Secondary | ICD-10-CM | POA: Diagnosis not present

## 2022-10-25 DIAGNOSIS — K219 Gastro-esophageal reflux disease without esophagitis: Secondary | ICD-10-CM | POA: Diagnosis not present

## 2022-10-25 DIAGNOSIS — E782 Mixed hyperlipidemia: Secondary | ICD-10-CM | POA: Diagnosis not present

## 2022-10-25 DIAGNOSIS — E669 Obesity, unspecified: Secondary | ICD-10-CM | POA: Diagnosis not present

## 2022-11-14 DIAGNOSIS — S29012A Strain of muscle and tendon of back wall of thorax, initial encounter: Secondary | ICD-10-CM | POA: Diagnosis not present

## 2022-11-14 DIAGNOSIS — S138XXA Sprain of joints and ligaments of other parts of neck, initial encounter: Secondary | ICD-10-CM | POA: Diagnosis not present

## 2022-11-14 DIAGNOSIS — M9902 Segmental and somatic dysfunction of thoracic region: Secondary | ICD-10-CM | POA: Diagnosis not present

## 2022-11-14 DIAGNOSIS — M9901 Segmental and somatic dysfunction of cervical region: Secondary | ICD-10-CM | POA: Diagnosis not present

## 2022-11-14 DIAGNOSIS — M9904 Segmental and somatic dysfunction of sacral region: Secondary | ICD-10-CM | POA: Diagnosis not present

## 2022-11-14 DIAGNOSIS — M5136 Other intervertebral disc degeneration, lumbar region: Secondary | ICD-10-CM | POA: Diagnosis not present

## 2022-11-14 DIAGNOSIS — M9903 Segmental and somatic dysfunction of lumbar region: Secondary | ICD-10-CM | POA: Diagnosis not present

## 2022-11-14 DIAGNOSIS — M48061 Spinal stenosis, lumbar region without neurogenic claudication: Secondary | ICD-10-CM | POA: Diagnosis not present

## 2022-12-12 DIAGNOSIS — S29012A Strain of muscle and tendon of back wall of thorax, initial encounter: Secondary | ICD-10-CM | POA: Diagnosis not present

## 2022-12-12 DIAGNOSIS — M48061 Spinal stenosis, lumbar region without neurogenic claudication: Secondary | ICD-10-CM | POA: Diagnosis not present

## 2022-12-12 DIAGNOSIS — M9904 Segmental and somatic dysfunction of sacral region: Secondary | ICD-10-CM | POA: Diagnosis not present

## 2022-12-12 DIAGNOSIS — M9901 Segmental and somatic dysfunction of cervical region: Secondary | ICD-10-CM | POA: Diagnosis not present

## 2022-12-12 DIAGNOSIS — M9902 Segmental and somatic dysfunction of thoracic region: Secondary | ICD-10-CM | POA: Diagnosis not present

## 2022-12-12 DIAGNOSIS — M5136 Other intervertebral disc degeneration, lumbar region: Secondary | ICD-10-CM | POA: Diagnosis not present

## 2022-12-12 DIAGNOSIS — M9903 Segmental and somatic dysfunction of lumbar region: Secondary | ICD-10-CM | POA: Diagnosis not present

## 2022-12-12 DIAGNOSIS — S138XXA Sprain of joints and ligaments of other parts of neck, initial encounter: Secondary | ICD-10-CM | POA: Diagnosis not present

## 2022-12-31 ENCOUNTER — Other Ambulatory Visit: Payer: Self-pay | Admitting: Nurse Practitioner

## 2022-12-31 DIAGNOSIS — E782 Mixed hyperlipidemia: Secondary | ICD-10-CM

## 2022-12-31 DIAGNOSIS — I10 Essential (primary) hypertension: Secondary | ICD-10-CM

## 2023-01-10 DIAGNOSIS — R142 Eructation: Secondary | ICD-10-CM | POA: Insufficient documentation

## 2023-01-11 ENCOUNTER — Ambulatory Visit: Payer: Medicare PPO | Admitting: Nurse Practitioner

## 2023-01-11 ENCOUNTER — Encounter: Payer: Self-pay | Admitting: Nurse Practitioner

## 2023-01-11 VITALS — BP 122/68 | HR 73 | Temp 98.1°F | Ht 67.0 in | Wt 201.0 lb

## 2023-01-11 DIAGNOSIS — R7303 Prediabetes: Secondary | ICD-10-CM | POA: Diagnosis not present

## 2023-01-11 DIAGNOSIS — E2839 Other primary ovarian failure: Secondary | ICD-10-CM

## 2023-01-11 DIAGNOSIS — Z23 Encounter for immunization: Secondary | ICD-10-CM | POA: Diagnosis not present

## 2023-01-11 DIAGNOSIS — I10 Essential (primary) hypertension: Secondary | ICD-10-CM | POA: Diagnosis not present

## 2023-01-11 DIAGNOSIS — Z6831 Body mass index (BMI) 31.0-31.9, adult: Secondary | ICD-10-CM

## 2023-01-11 DIAGNOSIS — E782 Mixed hyperlipidemia: Secondary | ICD-10-CM

## 2023-01-11 NOTE — Progress Notes (Signed)
I,[name],acting as a Education administrator for [name], FNP.,have documented all relevant documentation on the behalf of [name], FNP,as directed by  [name], FNP while in the presence of [name], [place].    Subjective:     Patient ID: [name] , female    DOB: [birth year] , 72 y.o.   MRN: [id number]   Chief Complaint  Patient presents with   Hypertension    HPI  Patient presents today for htn follow up. Patient is no longer taking Fosamax, not sure if she still needs this or can take OTC supplements. She was having reflux and Dr. Collene Mares felt this could be the cause.  She took the 2nd medication with the fosamax but is not making a difference. She was on dexilant. She does feel like her reflux is as bad. She does not take the second medication (famotidine) all the time due to forgetting.  She is trying to monitor her food sometimes. Patient has no other complaints or concerns.        Hypertension This is a chronic problem. The current episode started more than 1 year ago. The problem is unchanged. The problem is controlled. Pertinent negatives include no anxiety, chest pain, headaches or palpitations. There are no associated agents to hypertension. Risk factors for coronary artery disease include obesity and sedentary lifestyle. Past treatments include diuretics and ACE inhibitors. The current treatment provides significant improvement. There are no compliance problems.  There is no history of angina. There is no history of chronic renal disease.     Past Medical History:  Diagnosis Date   Arthritis    Constipation    GERD (gastroesophageal reflux disease)    Hypertension      Family History  Problem Relation Age of Onset   Diabetes Mother    Heart attack Father    Cancer Brother    Anesthesia problems Neg Hx      Current Outpatient Medications:    aspirin 81 MG tablet, Take 81 mg by mouth daily., Disp: , Rfl:    gabapentin (NEURONTIN) 300 MG capsule, Take  300 mg by mouth at bedtime., Disp: , Rfl:    lisinopril-hydrochlorothiazide (ZESTORETIC) 20-12.5 MG tablet, TAKE 1 TABLET BY MOUTH EVERY DAY, Disp: 90 tablet, Rfl: 1   meloxicam (MOBIC) 15 MG tablet, Take 15 mg by mouth daily., Disp: , Rfl:    pantoprazole (PROTONIX) 40 MG tablet, Take 40 mg by mouth every morning., Disp: , Rfl:    simvastatin (ZOCOR) 40 MG tablet, TAKE 1 TABLET BY MOUTH EVERY DAY, Disp: 90 tablet, Rfl: 1   Allergies  Allergen Reactions   Penicillins Hives     Review of Systems  Constitutional: Negative.   Respiratory: Negative.    Cardiovascular:  Negative for chest pain, palpitations and leg swelling.  Neurological: Negative.  Negative for headaches.  Psychiatric/Behavioral: Negative.    All other systems reviewed and are negative.    Today's Vitals   01/11/23 1009  BP: 122/68  Pulse: 73  Temp: 98.1 F (36.7 C)  TempSrc: Oral  SpO2: 99%  Weight: 201 lb (91.2 kg)  Height: 5' 7"$  (1.702 m)   Body mass index is 31.48 kg/m.   Objective:  Physical Exam Vitals reviewed.  Constitutional:      General: She is not in acute distress.    Appearance: Normal appearance.  Cardiovascular:     Rate and Rhythm: Normal rate and regular rhythm.     Pulses: Normal pulses.  Heart sounds: Normal heart sounds. No murmur heard. Pulmonary:     Effort: Pulmonary effort is normal. No respiratory distress.     Breath sounds: Normal breath sounds. No wheezing.  Skin:    General: Skin is warm and dry.     Capillary Refill: Capillary refill takes less than 2 seconds.  Neurological:     General: No focal deficit present.     Mental Status: She is alert and oriented to person, place, and time.     Cranial Nerves: No cranial nerve deficit.     Motor: No weakness.  Psychiatric:        Mood and Affect: Mood normal.        Behavior: Behavior normal.        Thought Content: Thought content normal.        Judgment: Judgment normal.         Assessment And Plan:     1.  Essential hypertension Comments: Blood pressure is well controlled, continue current medications - BMP8+eGFR  2. Prediabetes Comments: Diet controlled, continue focusing on healthy diet low in sugar and starches. - Hemoglobin A1c  3. Mixed hyperlipidemia Comments: Stable, continue statin, tolerating well. - Lipid panel  4. BMI 31.0-31.9,adult She is encouraged to strive for BMI less than 30 to decrease cardiac risk. Advised to aim for at least 150 minutes of exercise per week.  5. Decreased estrogen level Comments: She is no longer taking Fosamax due to having an increase in reflux. Encouraged to perform low impact walking and take a vitamin d - DG Bone Density; Future  6. Need for Tdap vaccination Will give tetanus vaccine today while in office. Refer to order management. TDAP will be administered to adults 70-43 years old every 10 years. - Tdap vaccine greater than or equal to 7yo IM     Patient was given opportunity to ask questions. Patient verbalized understanding of the plan and was able to repeat key elements of the plan. All questions were answered to their satisfaction.  [name], FNP   I, [name], FNP, have reviewed all documentation for this visit. The documentation on 01/11/23 for the exam, diagnosis, procedures, and orders are all accurate and complete.   IF YOU HAVE BEEN REFERRED TO A SPECIALIST, IT MAY TAKE 1-2 WEEKS TO SCHEDULE/PROCESS THE REFERRAL. IF YOU HAVE NOT HEARD FROM US/SPECIALIST IN TWO WEEKS, PLEASE GIVE [place] A CALL AT [phone].   THE PATIENT IS ENCOURAGED TO PRACTICE SOCIAL DISTANCING DUE TO THE COVID-19 PANDEMIC.

## 2023-01-11 NOTE — Patient Instructions (Addendum)
Hypertension, Adult Hypertension is another name for high blood pressure. High blood pressure forces your heart to work harder to pump blood. This can cause problems over time. There are two numbers in a blood pressure reading. There is a top number (systolic) over a bottom number (diastolic). It is best to have a blood pressure that is below 120/80. What are the causes? The cause of this condition is not known. Some other conditions can lead to high blood pressure. What increases the risk? Some lifestyle factors can make you more likely to develop high blood pressure: Smoking. Not getting enough exercise or physical activity. Being overweight. Having too much fat, sugar, calories, or salt (sodium) in your diet. Drinking too much alcohol. Other risk factors include: Having any of these conditions: Heart disease. Diabetes. High cholesterol. Kidney disease. Obstructive sleep apnea. Having a family history of high blood pressure and high cholesterol. Age. The risk increases with age. Stress. What are the signs or symptoms? High blood pressure may not cause symptoms. Very high blood pressure (hypertensive crisis) may cause: Headache. Fast or uneven heartbeats (palpitations). Shortness of breath. Nosebleed. Vomiting or feeling like you may vomit (nauseous). Changes in how you see. Very bad chest pain. Feeling dizzy. Seizures. How is this treated? This condition is treated by making healthy lifestyle changes, such as: Eating healthy foods. Exercising more. Drinking less alcohol. Your doctor may prescribe medicine if lifestyle changes do not help enough and if: Your top number is above 130. Your bottom number is above 80. Your personal target blood pressure may vary. Follow these instructions at home: Eating and drinking  If told, follow the DASH eating plan. To follow this plan: Fill one half of your plate at each meal with fruits and vegetables. Fill one fourth of your plate  at each meal with whole grains. Whole grains include whole-wheat pasta, brown rice, and whole-grain bread. Eat or drink low-fat dairy products, such as skim milk or low-fat yogurt. Fill one fourth of your plate at each meal with low-fat (lean) proteins. Low-fat proteins include fish, chicken without skin, eggs, beans, and tofu. Avoid fatty meat, cured and processed meat, or chicken with skin. Avoid pre-made or processed food. Limit the amount of salt in your diet to less than 1,500 mg each day. Do not drink alcohol if: Your doctor tells you not to drink. You are pregnant, may be pregnant, or are planning to become pregnant. If you drink alcohol: Limit how much you have to: 0-1 drink a day for women. 0-2 drinks a day for men. Know how much alcohol is in your drink. In the [place] one drink equals one 12 oz bottle of beer (355 mL), one 5 oz glass of wine (148 mL), or one 1 oz glass of hard liquor (44 mL). Lifestyle  Work with your doctor to stay at a healthy weight or to lose weight. Ask your doctor what the best weight is for you. Get at least 30 minutes of exercise that causes your heart to beat faster (aerobic exercise) most days of the week. This may include walking, swimming, or biking. Get at least 30 minutes of exercise that strengthens your muscles (resistance exercise) at least 3 days a week. This may include lifting weights or doing Pilates. Do not smoke or use any products that contain nicotine or tobacco. If you need help quitting, ask your doctor. Check your blood pressure at home as told by your doctor. Keep all follow-up visits. Medicines Take over-the-counter and prescription medicines  only as told by your doctor. Follow directions carefully. Do not skip doses of blood pressure medicine. The medicine does not work as well if you skip doses. Skipping doses also puts you at risk for problems. Ask your doctor about side effects or reactions to medicines that you should watch  for. Contact a doctor if: You think you are having a reaction to the medicine you are taking. You have headaches that keep coming back. You feel dizzy. You have swelling in your ankles. You have trouble with your vision. Get help right away if: You get a very bad headache. You start to feel mixed up (confused). You feel weak or numb. You feel faint. You have very bad pain in your: Chest. Belly (abdomen). You vomit more than once. You have trouble breathing. These symptoms may be an emergency. Get help right away. Call 911. Do not wait to see if the symptoms will go away. Do not drive yourself to the hospital. Summary Hypertension is another name for high blood pressure. High blood pressure forces your heart to work harder to pump blood. For most people, a normal blood pressure is less than 120/80. Making healthy choices can help lower blood pressure. If your blood pressure does not get lower with healthy choices, you may need to take medicine. This information is not intended to replace advice given to you by your health care provider. Make sure you discuss any questions you have with your health care provider. Document Revised: 09/02/2021 Document Reviewed: 09/02/2021 Elsevier Patient Education  [place].  Diphtheria; Tetanus; Pertussis (DTaP or Tdap) Vaccine Injection What is this medication? DIPHTHERIA; TETANUS; PERTUSSIS VACCINE (dif THEER ee uh; TET n [place]; per TUS iss VAK seen) reduces the risk of diphtheria, tetanus (lockjaw), and pertussis (whooping cough). It does not treat diphtheria, tetanus, or pertussis. It is still possible to get diphtheria, tetanus, or pertussis after receiving this vaccine, but the symptoms may be less severe or not last as long. It works by helping your immune system learn how to fight off a future infection. This medicine may be used for other purposes; ask your health care provider or pharmacist if you have questions. COMMON BRAND NAME(S):  Adacel, Boostrix, Certiva, Daptacel, Infanrix, Tripedia What should I tell my care team before I take this medication? They need to know if you have any of these conditions: Blood disorders, such as hemophilia Fever or infection Immune system problems Neurologic disease Seizures An unusual or allergic reaction to other vaccines, latex, other medications, foods, dyes, or preservatives Pregnant or trying to get pregnant Breastfeeding How should I use this medication? This vaccine is injected into a muscle. It is given by your care team. A copy of Vaccine Information Statements will be given before each vaccination. Be sure to read this information carefully each time. This sheet may change often. Talk to your care team about the use of this medication in children. While the DTaP vaccine may be given to children as young as 6 weeks and the Tdap vaccine may be given to children as young as 40 years old, precautions do apply. Overdosage: If you think you have taken too much of this medicine contact a poison control center or emergency room at once. NOTE: This medicine is only for you. Do not share this medicine with others. What if I miss a dose? It is important not to miss your dose. Call your care team if you are unable to keep an appointment. What may interact with  this medication? This medication may interact with the following: Certain medications that prevent or treat blood clots, such as warfarin, enoxaparin, dalteparin Immune globulin Medications that lower your chance of fighting an infection, such as adalimumab, anakinra, infliximab Medications to treat cancer Steroid medications, such as prednisone or cortisone This list may not describe all possible interactions. Give your health care provider a list of all the medicines, herbs, non-prescription drugs, or dietary supplements you use. Also tell them if you smoke, drink alcohol, or use illegal drugs. Some items may interact with your  medicine. What should I watch for while using this medication? See your care team for all shots of this vaccine as directed. Report any side effects to your care team right away. This vaccine, like all vaccines, may not fully protect everyone. What side effects may I notice from receiving this medication? Side effects that you should report to your care team as soon as possible: Allergic reactions--skin rash, itching, hives, swelling of the face, lips, tongue, or throat Feeling faint or lightheaded Side effects that usually do not require medical attention (report these to your care team if they continue or are bothersome): Chills Fever General discomfort and fatigue Headache Joint pain Muscle pain Pain, redness, or irritation at injection site This list may not describe all possible side effects. Call your doctor for medical advice about side effects. You may report side effects to FDA at 1-800-FDA-1088. Where should I keep my medication? This vaccine is only given by your care team. It will not be stored at home. NOTE: This sheet is a summary. It may not cover all possible information. If you have questions about this medicine, talk to your doctor, pharmacist, or health care provider.  2023 Elsevier/Gold Standard (2022-05-16 00:00:00)   You can take Restora which you can get over the counter, this is a probiotic which may help your reflux. You can purchase on [employer]

## 2023-01-16 DIAGNOSIS — S138XXA Sprain of joints and ligaments of other parts of neck, initial encounter: Secondary | ICD-10-CM | POA: Diagnosis not present

## 2023-01-16 DIAGNOSIS — M9902 Segmental and somatic dysfunction of thoracic region: Secondary | ICD-10-CM | POA: Diagnosis not present

## 2023-01-16 DIAGNOSIS — M9901 Segmental and somatic dysfunction of cervical region: Secondary | ICD-10-CM | POA: Diagnosis not present

## 2023-01-16 DIAGNOSIS — M9904 Segmental and somatic dysfunction of sacral region: Secondary | ICD-10-CM | POA: Diagnosis not present

## 2023-01-16 DIAGNOSIS — M48061 Spinal stenosis, lumbar region without neurogenic claudication: Secondary | ICD-10-CM | POA: Diagnosis not present

## 2023-01-16 DIAGNOSIS — M5136 Other intervertebral disc degeneration, lumbar region: Secondary | ICD-10-CM | POA: Diagnosis not present

## 2023-01-16 DIAGNOSIS — S29012A Strain of muscle and tendon of back wall of thorax, initial encounter: Secondary | ICD-10-CM | POA: Diagnosis not present

## 2023-01-16 DIAGNOSIS — M9903 Segmental and somatic dysfunction of lumbar region: Secondary | ICD-10-CM | POA: Diagnosis not present

## 2023-01-24 DIAGNOSIS — Z1231 Encounter for screening mammogram for malignant neoplasm of breast: Secondary | ICD-10-CM | POA: Diagnosis not present

## 2023-01-30 DIAGNOSIS — R928 Other abnormal and inconclusive findings on diagnostic imaging of breast: Secondary | ICD-10-CM | POA: Diagnosis not present

## 2023-02-06 ENCOUNTER — Other Ambulatory Visit: Payer: Self-pay | Admitting: Radiology

## 2023-02-06 DIAGNOSIS — N6032 Fibrosclerosis of left breast: Secondary | ICD-10-CM | POA: Diagnosis not present

## 2023-02-06 DIAGNOSIS — R921 Mammographic calcification found on diagnostic imaging of breast: Secondary | ICD-10-CM | POA: Diagnosis not present

## 2023-02-27 ENCOUNTER — Encounter: Payer: Self-pay | Admitting: Nurse Practitioner

## 2023-02-27 ENCOUNTER — Ambulatory Visit (INDEPENDENT_AMBULATORY_CARE_PROVIDER_SITE_OTHER): Payer: Medicare PPO | Admitting: Nurse Practitioner

## 2023-02-27 VITALS — BP 124/68 | HR 63 | Temp 98.0°F | Ht 67.0 in | Wt 197.0 lb

## 2023-02-27 DIAGNOSIS — Z9889 Other specified postprocedural states: Secondary | ICD-10-CM

## 2023-02-27 DIAGNOSIS — Z6830 Body mass index (BMI) 30.0-30.9, adult: Secondary | ICD-10-CM | POA: Diagnosis not present

## 2023-02-27 DIAGNOSIS — I10 Essential (primary) hypertension: Secondary | ICD-10-CM | POA: Diagnosis not present

## 2023-02-27 DIAGNOSIS — R7303 Prediabetes: Secondary | ICD-10-CM | POA: Diagnosis not present

## 2023-02-27 DIAGNOSIS — Z0001 Encounter for general adult medical examination with abnormal findings: Secondary | ICD-10-CM | POA: Diagnosis not present

## 2023-02-27 DIAGNOSIS — Z0000 Encounter for general adult medical examination without abnormal findings: Secondary | ICD-10-CM

## 2023-02-27 DIAGNOSIS — E669 Obesity, unspecified: Secondary | ICD-10-CM

## 2023-02-27 DIAGNOSIS — E782 Mixed hyperlipidemia: Secondary | ICD-10-CM

## 2023-02-27 NOTE — Progress Notes (Signed)
I,[name],acting as a Education administrator for [name], FNP.,have documented all relevant documentation on the behalf of [name], FNP,as directed by  [name], FNP while in the presence of [name], [name].   Subjective:     Patient ID: [name] , female    DOB: 11/23/1951 , 72 y.o.   MRN: [id number]   Chief Complaint  Patient presents with   Annual Exam    HPI  Patient presents today for annual exam.   Wt Readings from Last 3 Encounters: 02/27/23 : 197 lb (89.4 kg) 01/11/23 : 201 lb (91.2 kg) 09/07/22 : 196 lb 6.4 oz (89.1 kg)       Past Medical History:  Diagnosis Date   Arthritis    Constipation    GERD (gastroesophageal reflux disease)    H/O breast biopsy    Hypertension      Family History  Problem Relation Age of Onset   Diabetes Mother    Heart attack Father    Cancer Brother    Anesthesia problems Neg Hx      Current Outpatient Medications:    aspirin 81 MG tablet, Take 81 mg by mouth daily., Disp: , Rfl:    lisinopril-hydrochlorothiazide (ZESTORETIC) 20-12.5 MG tablet, TAKE 1 TABLET BY MOUTH EVERY DAY, Disp: 90 tablet, Rfl: 1   pantoprazole (PROTONIX) 40 MG tablet, Take 40 mg by mouth every morning., Disp: , Rfl:    simvastatin (ZOCOR) 40 MG tablet, TAKE 1 TABLET BY MOUTH EVERY DAY, Disp: 90 tablet, Rfl: 1   Allergies  Allergen Reactions   Penicillins Hives      The patient states she is status post hysterectomy.  No LMP recorded. Patient has had a hysterectomy.. Negative for Dysmenorrhea and Negative for Menorrhagia. Negative for: breast discharge, breast lump(s), breast pain and breast self exam. Associated symptoms include abnormal vaginal bleeding. Pertinent negatives include abnormal bleeding (hematology), anxiety, decreased libido, depression, difficulty falling sleep, dyspareunia, history of infertility, nocturia, sexual dysfunction, sleep disturbances, urinary incontinence, urinary urgency, vaginal discharge and vaginal  itching. Diet regular. The patient states her exercise level is twice a week at gym for 45 minutes with yoga and she has a legcercise machine at home - daily - it counts her steps.   The patient's tobacco use is:  Social History   Tobacco Use  Smoking Status Never  Smokeless Tobacco Never   She has been exposed to passive smoke. The patient's alcohol use is:  Social History   Substance and Sexual Activity  Alcohol Use No    Review of Systems  Constitutional: Negative.   HENT: Negative.    Eyes: Negative.   Respiratory: Negative.    Cardiovascular: Negative.   Gastrointestinal: Negative.   Endocrine: Negative.   Genitourinary: Negative.   Musculoskeletal: Negative.   Skin: Negative.   Allergic/Immunologic: Negative.   Neurological: Negative.   Hematological: Negative.   Psychiatric/Behavioral: Negative.       Today's Vitals   02/27/23 1126  BP: 124/68  Pulse: 63  Temp: 98 F (36.7 C)  TempSrc: Oral  SpO2: 98%  Weight: 197 lb (89.4 kg)  Height: 5\' 7"  (1.702 m)   Body mass index is 30.85 kg/m.   Objective:  Physical Exam Vitals reviewed.  Constitutional:      General: She is not in acute distress.    Appearance: Normal appearance. She is well-developed. She is obese.  HENT:     Head: Normocephalic and atraumatic.     Right Ear: Hearing,  tympanic membrane, ear canal and external ear normal. There is no impacted cerumen.     Left Ear: Hearing, tympanic membrane, ear canal and external ear normal. There is no impacted cerumen.     Nose:     Comments: Deferred - masked    Mouth/Throat:     Comments: Deferred - masked Eyes:     General: Lids are normal.     Extraocular Movements: Extraocular movements intact.     Conjunctiva/sclera: Conjunctivae normal.     Pupils: Pupils are equal, round, and reactive to light.     Funduscopic exam:    Right eye: No papilledema.        Left eye: No papilledema.  Neck:     Thyroid: No thyroid mass.     Vascular: No  carotid bruit.  Cardiovascular:     Rate and Rhythm: Normal rate and regular rhythm.     Pulses: Normal pulses.     Heart sounds: Normal heart sounds. No murmur heard. Pulmonary:     Effort: Pulmonary effort is normal. No respiratory distress.     Breath sounds: Normal breath sounds. No wheezing.  Chest:     Chest wall: No mass.  Breasts:    Tanner Score is 5.     Right: Normal. No mass or tenderness.     Left: Normal. No mass or tenderness.  Abdominal:     General: Abdomen is flat. Bowel sounds are normal. There is no distension.     Palpations: Abdomen is soft.     Tenderness: There is no abdominal tenderness.  Musculoskeletal:        General: No swelling. Normal range of motion.     Cervical back: Full passive range of motion without pain, normal range of motion and neck supple.     Right lower leg: No edema.     Left lower leg: No edema.  Lymphadenopathy:     Upper Body:     Right upper body: No supraclavicular, axillary or pectoral adenopathy.     Left upper body: No supraclavicular, axillary or pectoral adenopathy.  Skin:    General: Skin is warm and dry.     Capillary Refill: Capillary refill takes less than 2 seconds.  Neurological:     General: No focal deficit present.     Mental Status: She is alert and oriented to person, place, and time.     Cranial Nerves: No cranial nerve deficit.     Sensory: No sensory deficit.  Psychiatric:        Mood and Affect: Mood normal.        Behavior: Behavior normal.        Thought Content: Thought content normal.        Judgment: Judgment normal.         Assessment And Plan:     1. Encounter for general adult medical examination w/o abnormal findings Comments: She is due to have her bone density this week. Behavior modifications discussed and diet history reviewed.   Pt will continue to exercise regularly and modify diet with low GI, plant based foods and decrease intake of processed foods.  Recommend intake of daily  multivitamin, Vitamin D, and calcium.  Recommend mammogram and colonoscopy for preventive screenings, as well as recommend immunizations that include influenza, TDAP, and Shingles  2. Essential hypertension Comments: Blood pressure is well controlled, continue current medications - EKG 12-Lead - CBC - CMP14+EGFR  3. Prediabetes Comments: HgbA1c is stable, diet controlled. Continue  focusing on healthy diet low in sugar/carbs and exercise as toerated at least 150 minutes a week - Hemoglobin A1c  4. Mixed hyperlipidemia Comments: Cholesterol levels are stable, continue statin, tolerating well. - Lipid panel  5. BMI 30.0-30.9,adult She is encouraged to strive for BMI less than 30 to decrease cardiac risk. Advised to aim for at least 150 minutes of exercise per week.  6. H/O left breast biopsy Comments: Had done 3/11 - continues to be benign she is to have a repeat in 6 months, she will call for an appt  Patient was given opportunity to ask questions. Patient verbalized understanding of the plan and was able to repeat key elements of the plan. All questions were answered to their satisfaction.   Arnette Felts, FNP   I, Arnette Felts, FNP, have reviewed all documentation for this visit. The documentation on 02/27/23 for the exam, diagnosis, procedures, and orders are all accurate and complete.   THE PATIENT IS ENCOURAGED TO PRACTICE SOCIAL DISTANCING DUE TO THE COVID-19 PANDEMIC.

## 2023-02-28 LAB — CBC
Hematocrit: 38 % (ref 34.0–46.6)
Hemoglobin: 11.9 g/dL (ref 11.1–15.9)
MCH: 29.1 pg (ref 26.6–33.0)
MCHC: 31.3 g/dL — ABNORMAL LOW (ref 31.5–35.7)
MCV: 93 fL (ref 79–97)
Platelets: 307 10*3/uL (ref 150–450)
RBC: 4.09 x10E6/uL (ref 3.77–5.28)
RDW: 13 % (ref 11.7–15.4)
WBC: 5.1 10*3/uL (ref 3.4–10.8)

## 2023-02-28 LAB — LIPID PANEL
Chol/HDL Ratio: 2.8 ratio (ref 0.0–4.4)
Cholesterol, Total: 165 mg/dL (ref 100–199)
HDL: 59 mg/dL (ref >39)
LDL Chol Calc (NIH): 87 mg/dL (ref 0–99)
Triglycerides: 106 mg/dL (ref 0–149)
VLDL Cholesterol Cal: 19 mg/dL (ref 5–40)

## 2023-02-28 LAB — CMP14+EGFR
ALT: 14 IU/L (ref 0–32)
AST: 25 IU/L (ref 0–40)
Albumin/Globulin Ratio: 1.7 (ref 1.2–2.2)
Albumin: 4.5 g/dL (ref 3.8–4.8)
Alkaline Phosphatase: 87 IU/L (ref 44–121)
BUN/Creatinine Ratio: 13 (ref 12–28)
BUN: 13 mg/dL (ref 8–27)
Bilirubin Total: 0.3 mg/dL (ref 0.0–1.2)
CO2: 25 mmol/L (ref 20–29)
Calcium: 9.4 mg/dL (ref 8.7–10.3)
Chloride: 99 mmol/L (ref 96–106)
Creatinine, Ser: 0.99 mg/dL (ref 0.57–1.00)
Globulin, Total: 2.7 g/dL (ref 1.5–4.5)
Glucose: 77 mg/dL (ref 70–99)
Potassium: 4.1 mmol/L (ref 3.5–5.2)
Sodium: 139 mmol/L (ref 134–144)
Total Protein: 7.2 g/dL (ref 6.0–8.5)
eGFR: 61 mL/min/{1.73_m2} (ref >59)

## 2023-02-28 LAB — HEMOGLOBIN A1C
Est. average glucose Bld gHb Est-mCnc: 123 mg/dL
Hgb A1c MFr Bld: 5.9 % — ABNORMAL HIGH (ref 4.8–5.6)

## 2023-03-01 DIAGNOSIS — M9904 Segmental and somatic dysfunction of sacral region: Secondary | ICD-10-CM | POA: Diagnosis not present

## 2023-03-01 DIAGNOSIS — M5136 Other intervertebral disc degeneration, lumbar region: Secondary | ICD-10-CM | POA: Diagnosis not present

## 2023-03-01 DIAGNOSIS — M48061 Spinal stenosis, lumbar region without neurogenic claudication: Secondary | ICD-10-CM | POA: Diagnosis not present

## 2023-03-01 DIAGNOSIS — S138XXA Sprain of joints and ligaments of other parts of neck, initial encounter: Secondary | ICD-10-CM | POA: Diagnosis not present

## 2023-03-01 DIAGNOSIS — S29012A Strain of muscle and tendon of back wall of thorax, initial encounter: Secondary | ICD-10-CM | POA: Diagnosis not present

## 2023-03-01 DIAGNOSIS — M9901 Segmental and somatic dysfunction of cervical region: Secondary | ICD-10-CM | POA: Diagnosis not present

## 2023-03-01 DIAGNOSIS — M9902 Segmental and somatic dysfunction of thoracic region: Secondary | ICD-10-CM | POA: Diagnosis not present

## 2023-03-01 DIAGNOSIS — M9903 Segmental and somatic dysfunction of lumbar region: Secondary | ICD-10-CM | POA: Diagnosis not present

## 2023-03-02 ENCOUNTER — Ambulatory Visit
Admission: RE | Admit: 2023-03-02 | Discharge: 2023-03-02 | Disposition: A | Discharge status: Home / Self Care | Payer: Medicare PPO | Source: Ambulatory Visit | Attending: Nurse Practitioner | Admitting: Nurse Practitioner

## 2023-03-02 DIAGNOSIS — Z780 Asymptomatic menopausal state: Secondary | ICD-10-CM | POA: Diagnosis not present

## 2023-03-02 DIAGNOSIS — E2839 Other primary ovarian failure: Secondary | ICD-10-CM

## 2023-03-02 DIAGNOSIS — M85852 Other specified disorders of bone density and structure, left thigh: Secondary | ICD-10-CM | POA: Diagnosis not present

## 2023-03-03 ENCOUNTER — Encounter: Payer: Self-pay | Admitting: Internal Medicine

## 2023-03-29 DIAGNOSIS — S29012A Strain of muscle and tendon of back wall of thorax, initial encounter: Secondary | ICD-10-CM | POA: Diagnosis not present

## 2023-03-29 DIAGNOSIS — M48061 Spinal stenosis, lumbar region without neurogenic claudication: Secondary | ICD-10-CM | POA: Diagnosis not present

## 2023-03-29 DIAGNOSIS — M9901 Segmental and somatic dysfunction of cervical region: Secondary | ICD-10-CM | POA: Diagnosis not present

## 2023-03-29 DIAGNOSIS — S138XXA Sprain of joints and ligaments of other parts of neck, initial encounter: Secondary | ICD-10-CM | POA: Diagnosis not present

## 2023-03-29 DIAGNOSIS — M9904 Segmental and somatic dysfunction of sacral region: Secondary | ICD-10-CM | POA: Diagnosis not present

## 2023-03-29 DIAGNOSIS — M9903 Segmental and somatic dysfunction of lumbar region: Secondary | ICD-10-CM | POA: Diagnosis not present

## 2023-03-29 DIAGNOSIS — M5136 Other intervertebral disc degeneration, lumbar region: Secondary | ICD-10-CM | POA: Diagnosis not present

## 2023-03-29 DIAGNOSIS — M9902 Segmental and somatic dysfunction of thoracic region: Secondary | ICD-10-CM | POA: Diagnosis not present

## 2023-04-04 NOTE — Progress Notes (Signed)
[name],acting as a Neurosurgeon for Briana Felts, FNP.,have documented all relevant documentation on the behalf of Briana Felts, FNP,as directed by  Briana Felts, FNP while in the presence of Briana Felts, FNP.    Subjective:     Patient ID: [name] , female    DOB: 07-Dec-1950 , 72 y.o.   MRN: [id number]   Chief Complaint  Patient presents with   Hypertension   Diabetes    HPI  Patient presents today for a BP and pre dm check, patient reports compliance with medications. Patient reports having decreased hearing and would like to see an ENT. She has to turn her volume up on the TV for several months. Her first job she had to wear a headset.   She is working with a [facility] for her back pain. She has a history of back surgery.   Hypertension This is a chronic problem. The current episode started more than 1 year ago. The problem is unchanged. The problem is controlled. Pertinent negatives include no anxiety, chest pain, headaches or palpitations. There are no associated agents to hypertension. Risk factors for coronary artery disease include obesity and sedentary lifestyle. Past treatments include diuretics and ACE inhibitors. The current treatment provides significant improvement. There are no compliance problems.  There is no history of angina. There is no history of chronic renal disease.     Past Medical History:  Diagnosis Date   Arthritis    Constipation    GERD (gastroesophageal reflux disease)    H/O breast biopsy    Hypertension      Family History  Problem Relation Age of Onset   Diabetes Mother    Heart attack Father    Cancer Brother    Anesthesia problems Neg Hx      Current Outpatient Medications:    aspirin 81 MG tablet, Take 81 mg by mouth daily., Disp: , Rfl:    lisinopril-hydrochlorothiazide (ZESTORETIC) 20-12.5 MG tablet, TAKE 1 TABLET BY MOUTH EVERY DAY, Disp: 90 tablet, Rfl: 1   pantoprazole (PROTONIX) 40 MG tablet, Take 40 mg by mouth  every morning., Disp: , Rfl:    simvastatin (ZOCOR) 40 MG tablet, TAKE 1 TABLET BY MOUTH EVERY DAY, Disp: 90 tablet, Rfl: 1   Allergies  Allergen Reactions   Penicillins Hives     Review of Systems  Constitutional: Negative.   Respiratory: Negative.    Cardiovascular:  Negative for chest pain, palpitations and leg swelling.  Musculoskeletal:  Positive for back pain (she has some tingling to her legs a little - chiropractor is following).  Neurological: Negative.  Negative for headaches.  Psychiatric/Behavioral: Negative.    All other systems reviewed and are negative.    Today's Vitals   04/05/23 1104  BP: 120/64  Pulse: 79  Temp: 98.3 F (36.8 C)  Weight: 200 lb 9.9 oz (91 kg)  Height: 5\' 7"  (1.702 m)   Body mass index is 31.42 kg/m.   Objective:  Physical Exam Vitals reviewed.  Constitutional:      General: She is not in acute distress.    Appearance: Normal appearance. She is obese.  Cardiovascular:     Rate and Rhythm: Normal rate and regular rhythm.     Pulses: Normal pulses.     Heart sounds: Normal heart sounds. No murmur heard. Pulmonary:     Effort: Pulmonary effort is normal. No respiratory distress.     Breath sounds: Normal breath sounds. No wheezing.  Skin:    General:  Skin is warm and dry.     Capillary Refill: Capillary refill takes less than 2 seconds.  Neurological:     General: No focal deficit present.     Mental Status: She is alert and oriented to person, place, and time.     Cranial Nerves: No cranial nerve deficit.     Motor: No weakness.  Psychiatric:        Mood and Affect: Mood normal.        Behavior: Behavior normal.        Thought Content: Thought content normal.        Judgment: Judgment normal.     Hearing Screening   500Hz  1000Hz  2000Hz  4000Hz   Right ear 40 Fail Fail 40  Left ear Fail Fail Fail Fail       Assessment And Plan:     1. Essential hypertension Comments: Blood pressure is well controlled, continue current  medications - BMP8+eGFR  2. Prediabetes Comments: HgbA1c is stable. Continue focusing on diet low in sugar and starches. - Hemoglobin A1c  3. Decreased hearing of both ears Comments: left is worse than right. Failed her hearing screen, will refer to ENT for further evaluation - Ambulatory referral to ENT  4. Chronic bilateral low back pain with bilateral sciatica Comments: Advised her to see if she is able to get an MRI with her chiropractor as she may have had some changes since her previous back surgery  5. Class 1 obesity due to excess calories with body mass index (BMI) of 31.0 to 31.9 in adult, unspecified whether serious comorbidity present She is encouraged to strive for BMI less than 30 to decrease cardiac risk. Advised to aim for at least 150 minutes of exercise per week.     Patient was given opportunity to ask questions. Patient verbalized understanding of the plan and was able to repeat key elements of the plan. All questions were answered to their satisfaction.  Briana Felts, FNP   I, Briana Felts, FNP, have reviewed all documentation for this visit. The documentation on 04/05/23 for the exam, diagnosis, procedures, and orders are all accurate and complete.   IF YOU HAVE BEEN REFERRED TO A SPECIALIST, IT MAY TAKE 1-2 WEEKS TO SCHEDULE/PROCESS THE REFERRAL. IF YOU HAVE NOT HEARD FROM US/SPECIALIST IN TWO WEEKS, PLEASE GIVE [place] A CALL AT [phone].   THE PATIENT IS ENCOURAGED TO PRACTICE SOCIAL DISTANCING DUE TO THE COVID-19 PANDEMIC.

## 2023-04-05 ENCOUNTER — Ambulatory Visit: Payer: Medicare PPO | Admitting: Nurse Practitioner

## 2023-04-05 ENCOUNTER — Encounter: Payer: Self-pay | Admitting: Nurse Practitioner

## 2023-04-05 ENCOUNTER — Ambulatory Visit (INDEPENDENT_AMBULATORY_CARE_PROVIDER_SITE_OTHER): Payer: Medicare PPO

## 2023-04-05 VITALS — BP 120/64 | HR 79 | Temp 98.3°F | Ht 67.0 in | Wt 200.6 lb

## 2023-04-05 DIAGNOSIS — G8929 Other chronic pain: Secondary | ICD-10-CM | POA: Diagnosis not present

## 2023-04-05 DIAGNOSIS — H6121 Impacted cerumen, right ear: Secondary | ICD-10-CM

## 2023-04-05 DIAGNOSIS — H9193 Unspecified hearing loss, bilateral: Secondary | ICD-10-CM | POA: Diagnosis not present

## 2023-04-05 DIAGNOSIS — R7303 Prediabetes: Secondary | ICD-10-CM

## 2023-04-05 DIAGNOSIS — I10 Essential (primary) hypertension: Secondary | ICD-10-CM

## 2023-04-05 DIAGNOSIS — Z6831 Body mass index (BMI) 31.0-31.9, adult: Secondary | ICD-10-CM

## 2023-04-05 DIAGNOSIS — Z0000 Encounter for general adult medical examination without abnormal findings: Secondary | ICD-10-CM | POA: Diagnosis not present

## 2023-04-05 DIAGNOSIS — M5442 Lumbago with sciatica, left side: Secondary | ICD-10-CM

## 2023-04-05 DIAGNOSIS — E6609 Other obesity due to excess calories: Secondary | ICD-10-CM | POA: Diagnosis not present

## 2023-04-05 DIAGNOSIS — M5441 Lumbago with sciatica, right side: Secondary | ICD-10-CM

## 2023-04-05 NOTE — Patient Instructions (Signed)
[name] , Thank you for taking time to come for your Medicare Wellness Visit. I appreciate your ongoing commitment to your health goals. Please review the following plan we discussed and let me know if I can assist you in the future.   These are the goals we discussed:  Goals       Patient Stated      02/12/2020, wants to start exercising and eating healthier      Patient Stated      03/17/2021, [facility]      Patient Stated      03/23/2022, wants to lose weight and be more active      Patient Stated      04/05/2023, no goals      Weight (lb) < 200 lb (90.7 kg) (pt-stated)      Wants to lose 5 pounds        This is a list of the screening recommended for you and due dates:  Health Maintenance  Topic Date Due   COVID-19 Vaccine (7 - 2023-24 season) 06/28/2023*   Flu Shot  06/29/2023   Mammogram  01/25/2024   Medicare Annual Wellness Visit  04/04/2024   Colon Cancer Screening  05/03/2032   DTaP/Tdap/Td vaccine (3 - Td or Tdap) 01/11/2033   Pneumonia Vaccine  Completed   DEXA scan (bone density measurement)  Completed   Hepatitis C Screening: USPSTF Recommendation to screen - Ages 49-79 yo.  Completed   Zoster (Shingles) Vaccine  Completed   HPV Vaccine  Aged Out  *Topic was postponed. The date shown is not the original due date.    Advanced directives: Advance directive discussed with you today. Even though you declined this today please call our office should you change your mind and we can give you the proper paperwork for you to fill out.  Conditions/risks identified: none  Next appointment: Follow up in one year for your annual wellness visit    Preventive Care 65 Years and Older, Female Preventive care refers to lifestyle choices and visits with your health care provider that can promote health and wellness. What does preventive care include? A yearly physical exam. This is also called an annual well check. Dental exams once or twice a  year. Routine eye exams. Ask your health care provider how often you should have your eyes checked. Personal lifestyle choices, including: Daily care of your teeth and gums. Regular physical activity. Eating a healthy diet. Avoiding tobacco and drug use. Limiting alcohol use. Practicing safe sex. Taking low-dose aspirin every day. Taking vitamin and mineral supplements as recommended by your health care provider. What happens during an annual well check? The services and screenings done by your health care provider during your annual well check will depend on your age, overall health, lifestyle risk factors, and family history of disease. Counseling  Your health care provider may ask you questions about your: Alcohol use. Tobacco use. Drug use. Emotional well-being. Home and relationship well-being. Sexual activity. Eating habits. History of falls. Memory and ability to understand (cognition). Work and work Astronomer. Reproductive health. Screening  You may have the following tests or measurements: Height, weight, and BMI. Blood pressure. Lipid and cholesterol levels. These may be checked every 5 years, or more frequently if you are over 51 years old. Skin check. Lung cancer screening. You may have this screening every year starting at age 38 if you have a 30-pack-year history of smoking and currently smoke or have  quit within the past 15 years. Fecal occult blood test (FOBT) of the stool. You may have this test every year starting at age 1. Flexible sigmoidoscopy or colonoscopy. You may have a sigmoidoscopy every 5 years or a colonoscopy every 10 years starting at age 65. Hepatitis C blood test. Hepatitis B blood test. Sexually transmitted disease (STD) testing. Diabetes screening. This is done by checking your blood sugar (glucose) after you have not eaten for a while (fasting). You may have this done every 1-3 years. Bone density scan. This is done to screen for  osteoporosis. You may have this done starting at age 40. Mammogram. This may be done every 1-2 years. Talk to your health care provider about how often you should have regular mammograms. Talk with your health care provider about your test results, treatment options, and if necessary, the need for more tests. Vaccines  Your health care provider may recommend certain vaccines, such as: Influenza vaccine. This is recommended every year. Tetanus, diphtheria, and acellular pertussis (Tdap, Td) vaccine. You may need a Td booster every 10 years. Zoster vaccine. You may need this after age 67. Pneumococcal 13-valent conjugate (PCV13) vaccine. One dose is recommended after age 21. Pneumococcal polysaccharide (PPSV23) vaccine. One dose is recommended after age 98. Talk to your health care provider about which screenings and vaccines you need and how often you need them. This information is not intended to replace advice given to you by your health care provider. Make sure you discuss any questions you have with your health care provider. Document Released: 12/11/2015 Document Revised: 08/03/2016 Document Reviewed: 09/15/2015 Elsevier Interactive Patient Education  2017 [employer].  Fall Prevention in the Home Falls can cause injuries. They can happen to people of all ages. There are many things you can do to make your home safe and to help prevent falls. What can I do on the outside of my home? Regularly fix the edges of walkways and driveways and fix any cracks. Remove anything that might make you trip as you walk through a door, such as a raised step or threshold. Trim any bushes or trees on the path to your home. Use bright outdoor lighting. Clear any walking paths of anything that might make someone trip, such as rocks or tools. Regularly check to see if handrails are loose or broken. Make sure that both sides of any steps have handrails. Any raised decks and porches should have guardrails on  the edges. Have any leaves, snow, or ice cleared regularly. Use sand or salt on walking paths during winter. Clean up any spills in your garage right away. This includes oil or grease spills. What can I do in the bathroom? Use night lights. Install grab bars by the toilet and in the tub and shower. Do not use towel bars as grab bars. Use non-skid mats or decals in the tub or shower. If you need to sit down in the shower, use a plastic, non-slip stool. Keep the floor dry. Clean up any water that spills on the floor as soon as it happens. Remove soap buildup in the tub or shower regularly. Attach bath mats securely with double-sided non-slip rug tape. Do not have throw rugs and other things on the floor that can make you trip. What can I do in the bedroom? Use night lights. Make sure that you have a light by your bed that is easy to reach. Do not use any sheets or blankets that are too big for your bed.  They should not hang down onto the floor. Have a firm chair that has side arms. You can use this for support while you get dressed. Do not have throw rugs and other things on the floor that can make you trip. What can I do in the kitchen? Clean up any spills right away. Avoid walking on wet floors. Keep items that you use a lot in easy-to-reach places. If you need to reach something above you, use a strong step stool that has a grab bar. Keep electrical cords out of the way. Do not use floor polish or wax that makes floors slippery. If you must use wax, use non-skid floor wax. Do not have throw rugs and other things on the floor that can make you trip. What can I do with my stairs? Do not leave any items on the stairs. Make sure that there are handrails on both sides of the stairs and use them. Fix handrails that are broken or loose. Make sure that handrails are as long as the stairways. Check any carpeting to make sure that it is firmly attached to the stairs. Fix any carpet that is loose  or worn. Avoid having throw rugs at the top or bottom of the stairs. If you do have throw rugs, attach them to the floor with carpet tape. Make sure that you have a light switch at the top of the stairs and the bottom of the stairs. If you do not have them, ask someone to add them for you. What else can I do to help prevent falls? Wear shoes that: Do not have high heels. Have rubber bottoms. Are comfortable and fit you well. Are closed at the toe. Do not wear sandals. If you use a stepladder: Make sure that it is fully opened. Do not climb a closed stepladder. Make sure that both sides of the stepladder are locked into place. Ask someone to hold it for you, if possible. Clearly mark and make sure that you can see: Any grab bars or handrails. First and last steps. Where the edge of each step is. Use tools that help you move around (mobility aids) if they are needed. These include: Canes. Walkers. Scooters. Crutches. Turn on the lights when you go into a dark area. Replace any light bulbs as soon as they burn out. Set up your furniture so you have a clear path. Avoid moving your furniture around. If any of your floors are uneven, fix them. If there are any pets around you, be aware of where they are. Review your medicines with your doctor. Some medicines can make you feel dizzy. This can increase your chance of falling. Ask your doctor what other things that you can do to help prevent falls. This information is not intended to replace advice given to you by your health care provider. Make sure you discuss any questions you have with your health care provider. Document Released: 09/10/2009 Document Revised: 04/21/2016 Document Reviewed: 12/19/2014 Elsevier Interactive Patient Education  2017 [employer].

## 2023-04-05 NOTE — Patient Instructions (Signed)
You need to take vitamin d at least 3,000 units and calcium 1200 mg daily for osteopenia.

## 2023-04-05 NOTE — Progress Notes (Signed)
Subjective:   [name] is a 72 y.o. female who presents for Medicare Annual (Subsequent) preventive examination.  Review of Systems     Cardiac Risk Factors include: advanced age (>59men, >8 women);dyslipidemia;hypertension;obesity (BMI >30kg/m2)     Objective:    Today's Vitals   04/05/23 1047  BP: 120/64  Pulse: 79  Temp: 98.3 F (36.8 C)  TempSrc: Oral  SpO2: 96%  Weight: 200 lb 9.6 oz (91 kg)  Height: 5\' 7"  (1.702 m)   Body mass index is 31.42 kg/m.     04/05/2023   10:55 AM 03/23/2022   12:19 PM 03/17/2021    4:01 PM 02/12/2020    3:17 PM 01/23/2019    3:00 PM 02/08/2012    5:19 PM 02/08/2012    7:14 AM  Advanced Directives  Does Patient Have a Medical Advance Directive? No No No No No Patient does not have advance directive;Patient would not like information Patient does not have advance directive;Patient would not like information  Would patient like information on creating a medical advance directive? No - Patient declined Yes (MAU/Ambulatory/Procedural Areas - Information given) No - Patient declined No - Patient declined Yes (MAU/Ambulatory/Procedural Areas - Information given)    Pre-existing out of facility DNR order (yellow form or pink MOST form)      No     Current Medications (verified) Outpatient Encounter Medications as of 04/05/2023  Medication Sig   aspirin 81 MG tablet Take 81 mg by mouth daily.   lisinopril-hydrochlorothiazide (ZESTORETIC) 20-12.5 MG tablet TAKE 1 TABLET BY MOUTH EVERY DAY   pantoprazole (PROTONIX) 40 MG tablet Take 40 mg by mouth every morning.   simvastatin (ZOCOR) 40 MG tablet TAKE 1 TABLET BY MOUTH EVERY DAY   No facility-administered encounter medications on file as of 04/05/2023.    Allergies (verified) Penicillins   History: Past Medical History:  Diagnosis Date   Arthritis    Constipation    GERD (gastroesophageal reflux disease)    H/O breast biopsy    Hypertension    Past Surgical History:  Procedure  Laterality Date   ABDOMINAL HYSTERECTOMY     BREAST BIOPSY Left 01/2013   BREAST SURGERY     bio  neg cancer   EYE SURGERY  2019   bilateral cataract surgery   Family History  Problem Relation Age of Onset   Diabetes Mother    Heart attack Father    Cancer Brother    Anesthesia problems Neg Hx    Social History   Socioeconomic History   Marital status: Widowed    Spouse name: Not on file   Number of children: Not on file   Years of education: Not on file   Highest education level: Not on file  Occupational History   Occupation: retired  Tobacco Use   Smoking status: Never   Smokeless tobacco: Never  Vaping Use   Vaping Use: Never used  Substance and Sexual Activity   Alcohol use: No   Drug use: No   Sexual activity: Not Currently  Other Topics Concern   Not on file  Social History Narrative   Not on file   Social Determinants of Health   Financial Resource Strain: Low Risk  (04/05/2023)   Overall Financial Resource Strain (CARDIA)    Difficulty of Paying Living Expenses: Not hard at all  Food Insecurity: No Food Insecurity (04/05/2023)   Hunger Vital Sign    Worried About Running Out of Food in the Last Year:  Never true    Ran Out of Food in the Last Year: Never true  Transportation Needs: No Transportation Needs (04/05/2023)   PRAPARE - Administrator, Civil Service (Medical): No    Lack of Transportation (Non-Medical): No  Physical Activity: Insufficiently Active (04/05/2023)   Exercise Vital Sign    Days of Exercise per Week: 2 days    Minutes of Exercise per Session: 50 min  Stress: No Stress Concern Present (04/05/2023)   [employer] of Occupational Health - Occupational Stress Questionnaire    Feeling of Stress : Not at all  Social Connections: Not on file    Tobacco Counseling Counseling given: Not Answered   Clinical Intake:  Pre-visit preparation completed: Yes  Pain : No/denies pain     Nutritional Status: BMI > 30   Obese Nutritional Risks: None Diabetes: No  How often do you need to have someone help you when you read instructions, pamphlets, or other written materials from your doctor or pharmacy?: 1 - Never  Diabetic? no  Interpreter Needed?: No  Information entered by :: NAllen LPN   Activities of Daily Living    04/05/2023   10:56 AM  In your present state of health, do you have any difficulty performing the following activities:  Hearing? 1  Comment a little bit  Vision? 0  Difficulty concentrating or making decisions? 0  Walking or climbing stairs? 1  Dressing or bathing? 0  Doing errands, shopping? 0  Preparing Food and eating ? N  Using the Toilet? N  In the past six months, have you accidently leaked urine? N  Do you have problems with loss of bowel control? N  Managing your Medications? N  Managing your Finances? N  Housekeeping or managing your Housekeeping? N    Patient Care Team: Arnette Felts, FNP as PCP - General (General Practice) Epworth, Ritesh, OD (Optometry)  Indicate any recent Medical Services you may have received from other than [facility] providers in the past year (date may be approximate).     Assessment:   This is a routine wellness examination for [place].  Hearing/Vision screen Vision Screening - Comments:: Regular eye exams, Neutra Opt  Dietary issues and exercise activities discussed: Current Exercise Habits: Structured exercise class, Type of exercise: yoga, Time (Minutes): 45, Frequency (Times/Week): 2, Weekly Exercise (Minutes/Week): 90   Goals Addressed             This Visit's Progress    Patient Stated       04/05/2023, no goals       Depression Screen    04/05/2023   10:56 AM 02/27/2023   11:22 AM 09/07/2022    9:45 AM 03/23/2022   12:21 PM 03/17/2021    4:02 PM 02/17/2021    2:39 PM 02/12/2020    3:18 PM  PHQ 2/9 Scores  PHQ - 2 Score 0 0 0 0 0 1 0  PHQ- 9 Score       2    Fall Risk    04/05/2023   10:56 AM 02/27/2023   11:22 AM  09/07/2022    9:45 AM 09/07/2022    9:30 AM 03/23/2022   12:21 PM  Fall Risk   Falls in the past year? 0 0 0 0 0  Number falls in past yr: 0  0  0  Injury with Fall? 0  0  0  Risk for fall due to : No Fall Risks;Medication side effect  No Fall Risks  Medication side effect  Follow up Falls prevention discussed;Education provided;Falls evaluation completed  Falls evaluation completed  Falls evaluation completed;Education provided;Falls prevention discussed    FALL RISK PREVENTION PERTAINING TO THE HOME:  Any stairs in or around the home? No  If so, are there any without handrails? N/a Home free of loose throw rugs in walkways, pet beds, electrical cords, etc? Yes  Adequate lighting in your home to reduce risk of falls? Yes   ASSISTIVE DEVICES UTILIZED TO PREVENT FALLS:  Life alert? No  Use of a cane, walker or w/c? No  Grab bars in the bathroom? Yes  Shower chair or bench in shower? No  Elevated toilet seat or a handicapped toilet? Yes   TIMED UP AND GO:  Was the test performed? Yes .  Length of time to ambulate 10 feet: 5 sec.   Gait steady and fast without use of assistive device  Cognitive Function:        04/05/2023   10:58 AM 03/23/2022   12:23 PM 03/17/2021    4:03 PM 02/12/2020    3:19 PM 01/23/2019    3:04 PM  6CIT Screen  What Year? 0 points 0 points 0 points 0 points 0 points  What month? 0 points 0 points 0 points 0 points 0 points  What time? 0 points 0 points 0 points 0 points 0 points  Count back from 20 0 points 0 points 0 points 0 points 0 points  Months in reverse 0 points 0 points 0 points 0 points 0 points  Repeat phrase 0 points 0 points 4 points 0 points 0 points  Total Score 0 points 0 points 4 points 0 points 0 points    Immunizations Immunization History  Administered Date(s) Administered   Fluad Quad(high Dose 65+) 08/17/2020, 08/23/2021, 09/07/2022   Influenza, High Dose Seasonal PF 08/18/2018, 07/24/2019   PFIZER Comirnaty(Gray  Top)Covid-19 Tri-Sucrose Vaccine 03/11/2021   PFIZER(Purple Top)SARS-COV-2 Vaccination 01/12/2020, 02/04/2020, 09/03/2020   PNEUMOCOCCAL CONJUGATE-20 02/22/2022   Pfizer Covid-19 Vaccine Bivalent Booster 66yrs & up 08/12/2021, 09/24/2022   Pneumococcal Polysaccharide-23 12/21/2016   Respiratory Syncytial Virus Vaccine,Recomb Aduvanted(Arexvy) 12/14/2022   Tdap 10/30/2012, 01/11/2023   Zoster Recombinat (Shingrix) 10/27/2021, 01/25/2022    TDAP status: Up to date  Flu Vaccine status: Up to date  Pneumococcal vaccine status: Up to date  Covid-19 vaccine status: Completed vaccines  Qualifies for Shingles Vaccine? Yes   Zostavax completed Yes   Shingrix Completed?: Yes  Screening Tests Health Maintenance  Topic Date Due   Medicare Annual Wellness (AWV)  03/24/2023   COVID-19 Vaccine (7 - 2023-24 season) 06/28/2023 (Originally 11/19/2022)   INFLUENZA VACCINE  06/29/2023   MAMMOGRAM  01/25/2024   COLONOSCOPY (Pts 45-51yrs Insurance coverage will need to be confirmed)  05/03/2032   DTaP/Tdap/Td (3 - Td or Tdap) 01/11/2033   Pneumonia Vaccine 29+ Years old  Completed   DEXA SCAN  Completed   Hepatitis C Screening  Completed   Zoster Vaccines- Shingrix  Completed   HPV VACCINES  Aged Out    Health Maintenance  Health Maintenance Due  Topic Date Due   Medicare Annual Wellness (AWV)  03/24/2023    Colorectal cancer screening: No longer required.   Mammogram status: Completed 01/24/2023. Repeat every year  Bone Density status: Completed 03/02/2023.   Lung Cancer Screening: (Low Dose CT Chest recommended if Age 23-80 years, 30 pack-year currently smoking OR have quit w/in 15years.) does not qualify.   Lung Cancer Screening Referral: no  Additional  Screening:  Hepatitis C Screening: does qualify; Completed 01/23/2019  Vision Screening: Recommended annual ophthalmology exams for early detection of glaucoma and other disorders of the eye. Is the patient up to date with their  annual eye exam?  Yes  Who is the provider or what is the name of the office in which the patient attends annual eye exams? [facility] If pt is not established with a provider, would they like to be referred to a provider to establish care? No .   Dental Screening: Recommended annual dental exams for proper oral hygiene  Community Resource Referral / Chronic Care Management: CRR required this visit?  No   CCM required this visit?  No      Plan:     I have personally reviewed and noted the following in the patient's chart:   Medical and social history Use of alcohol, tobacco or illicit drugs  Current medications and supplements including opioid prescriptions. Patient is not currently taking opioid prescriptions. Functional ability and status Nutritional status Physical activity Advanced directives List of other physicians Hospitalizations, surgeries, and ER visits in previous 12 months Vitals Screenings to include cognitive, depression, and falls Referrals and appointments  In addition, I have reviewed and discussed with patient certain preventive protocols, quality metrics, and best practice recommendations. A written personalized care plan for preventive services as well as general preventive health recommendations were provided to patient.     Barb Merino, LPN   [id number]/8118   Nurse Notes: none

## 2023-04-06 LAB — HEMOGLOBIN A1C
Est. average glucose Bld gHb Est-mCnc: 117 mg/dL
Hgb A1c MFr Bld: 5.7 % — ABNORMAL HIGH (ref 4.8–5.6)

## 2023-04-06 LAB — BMP8+EGFR
BUN/Creatinine Ratio: 12 (ref 12–28)
BUN: 13 mg/dL (ref 8–27)
CO2: 25 mmol/L (ref 20–29)
Calcium: 9.7 mg/dL (ref 8.7–10.3)
Chloride: 98 mmol/L (ref 96–106)
Creatinine, Ser: 1.11 mg/dL — ABNORMAL HIGH (ref 0.57–1.00)
Glucose: 78 mg/dL (ref 70–99)
Potassium: 4.2 mmol/L (ref 3.5–5.2)
Sodium: 139 mmol/L (ref 134–144)
eGFR: 53 mL/min/{1.73_m2} — ABNORMAL LOW (ref >59)

## 2023-04-19 DIAGNOSIS — H9193 Unspecified hearing loss, bilateral: Secondary | ICD-10-CM | POA: Insufficient documentation

## 2023-04-26 DIAGNOSIS — M9902 Segmental and somatic dysfunction of thoracic region: Secondary | ICD-10-CM | POA: Diagnosis not present

## 2023-04-26 DIAGNOSIS — S138XXA Sprain of joints and ligaments of other parts of neck, initial encounter: Secondary | ICD-10-CM | POA: Diagnosis not present

## 2023-04-26 DIAGNOSIS — M5136 Other intervertebral disc degeneration, lumbar region: Secondary | ICD-10-CM | POA: Diagnosis not present

## 2023-04-26 DIAGNOSIS — M9904 Segmental and somatic dysfunction of sacral region: Secondary | ICD-10-CM | POA: Diagnosis not present

## 2023-04-26 DIAGNOSIS — M9901 Segmental and somatic dysfunction of cervical region: Secondary | ICD-10-CM | POA: Diagnosis not present

## 2023-04-26 DIAGNOSIS — S29012A Strain of muscle and tendon of back wall of thorax, initial encounter: Secondary | ICD-10-CM | POA: Diagnosis not present

## 2023-04-26 DIAGNOSIS — M9903 Segmental and somatic dysfunction of lumbar region: Secondary | ICD-10-CM | POA: Diagnosis not present

## 2023-04-26 DIAGNOSIS — M48061 Spinal stenosis, lumbar region without neurogenic claudication: Secondary | ICD-10-CM | POA: Diagnosis not present

## 2023-04-27 ENCOUNTER — Encounter: Payer: Self-pay | Admitting: Family Medicine

## 2023-04-27 ENCOUNTER — Ambulatory Visit: Payer: Medicare PPO | Admitting: Family Medicine

## 2023-04-27 VITALS — BP 120/64 | HR 81 | Temp 98.4°F | Ht 67.0 in | Wt 199.0 lb

## 2023-04-27 DIAGNOSIS — R21 Rash and other nonspecific skin eruption: Secondary | ICD-10-CM | POA: Diagnosis not present

## 2023-04-27 DIAGNOSIS — Z6831 Body mass index (BMI) 31.0-31.9, adult: Secondary | ICD-10-CM | POA: Diagnosis not present

## 2023-04-27 DIAGNOSIS — E6609 Other obesity due to excess calories: Secondary | ICD-10-CM

## 2023-04-27 DIAGNOSIS — L299 Pruritus, unspecified: Secondary | ICD-10-CM

## 2023-04-27 MED ORDER — CETIRIZINE HCL 10 MG PO TABS: 10.0000 mg | ORAL_TABLET | Freq: Every day | ORAL | 0 refills | Status: DC

## 2023-04-27 MED ORDER — PREDNISONE 5 MG PO TABS: ORAL_TABLET | ORAL | 0 refills | Status: DC

## 2023-04-27 MED ORDER — BETAMETHASONE DIPROPIONATE 0.05 % EX CREA: TOPICAL_CREAM | Freq: Two times a day (BID) | CUTANEOUS | 0 refills | Status: DC

## 2023-04-27 NOTE — Progress Notes (Addendum)
I,[name],acting as a Neurosurgeon for [employer], NP.,have documented all relevant documentation on the behalf of Darothy Courtright, NP,as directed by  Jontavious Commons Moshe Salisbury, NP while in the presence of Gerard Cantara, NP.    Subjective:     Patient ID: [name] , female    DOB: Dec 08, 1950 , 72 y.o.   MRN: [id number]   Chief Complaint  Patient presents with   Rash    HPI  Patient presents today for a rash. Patient stated she is breaking out on her neck,leg, under bra strap. The rash appeared a couple days ago. She reports she is really itchy. She has tried using cortisone cream but it didn't relieve the itching.   Wt Readings from Last 3 Encounters: 04/27/23 : 199 lb (90.3 kg) 04/05/23 : 200 lb 9.9 oz (91 kg) 04/05/23 : 200 lb 9.6 oz (91 kg)    Rash This is a new problem. The current episode started in the past 7 days. The problem has been gradually worsening since onset. The affected locations include the neck, torso and right upper leg. The rash is characterized by redness and itchiness. She was exposed to nothing. Pertinent negatives include no cough or shortness of breath. Past treatments include anti-itch cream. The treatment provided no relief.     Past Medical History:  Diagnosis Date   Arthritis    Constipation    GERD (gastroesophageal reflux disease)    H/O breast biopsy    Hypertension      Family History  Problem Relation Age of Onset   Diabetes Mother    Heart attack Father    Cancer Brother    Anesthesia problems Neg Hx      Current Outpatient Medications:    aspirin 81 MG tablet, Take 81 mg by mouth daily., Disp: , Rfl:    betamethasone dipropionate 0.05 % cream, Apply topically 2 (two) times daily for 7 days., Disp: 30 g, Rfl: 0   cetirizine (ZYRTEC ALLERGY) 10 MG tablet, Take 1 tablet (10 mg total) by mouth daily for 20 days., Disp: 20 tablet, Rfl: 0   lisinopril-hydrochlorothiazide (ZESTORETIC) 20-12.5 MG tablet, TAKE 1 TABLET BY MOUTH EVERY DAY, Disp: 90  tablet, Rfl: 1   pantoprazole (PROTONIX) 40 MG tablet, Take 40 mg by mouth every morning., Disp: , Rfl:    predniSONE (DELTASONE) 5 MG tablet, Please give the 5mg  dose pack. Thanks, Disp: 21 tablet, Rfl: 0   simvastatin (ZOCOR) 40 MG tablet, TAKE 1 TABLET BY MOUTH EVERY DAY, Disp: 90 tablet, Rfl: 1   Allergies  Allergen Reactions   Penicillins Hives     Review of Systems  Constitutional: Negative.   HENT: Negative.    Respiratory: Negative.  Negative for cough, chest tightness, shortness of breath and wheezing.   Skin:  Positive for rash.       itchy  Psychiatric/Behavioral: Negative.       Today's Vitals   04/27/23 1403  BP: 120/64  Pulse: 81  Temp: 98.4 F (36.9 C)  Weight: 199 lb (90.3 kg)  Height: 5\' 7"  (1.702 m)  PainSc: 0-No pain   Body mass index is 31.17 kg/m.  The 10-year ASCVD risk score (Arnett DK, et al., 2019) is: 10.2%   Values used to calculate the score:     Age: 40 years     Sex: Female     Is Non-Hispanic African American: Yes     Diabetic: No     Tobacco smoker: No  Systolic Blood Pressure: 120 mmHg     Is BP treated: Yes     HDL Cholesterol: 59 mg/dL     Total Cholesterol: 165 mg/dL ++ Objective:  Physical Exam Constitutional:      Appearance: Normal appearance.  Cardiovascular:     Rate and Rhythm: Normal rate and regular rhythm.     Pulses: Normal pulses.     Heart sounds: Normal heart sounds.  Pulmonary:     Effort: Pulmonary effort is normal. No respiratory distress.     Breath sounds: Normal breath sounds.  Abdominal:     General: Bowel sounds are normal.  Skin:    Findings: Rash present.     Comments: Itchy, red  bumpy areas  Neurological:     General: No focal deficit present.     Mental Status: She is alert and oriented to person, place, and time. Mental status is at baseline.  Psychiatric:        Mood and Affect: Mood normal.         Assessment And Plan:     1. Rash and nonspecific skin eruption Comments: Start  Prednisone 5mg  DosePak;  Apply Topical Corticosteroid as directed  2. Pruritus Comments: Prescribe daily antihistamine(Cetirizine 10 mg QD)  3. Class 1 obesity due to excess calories with body mass index (BMI) of 31.0 to 31.9 in adult, unspecified whether serious comorbidity present    Return if symptoms worsen or fail to improve, for keep previous appt.  Patient was given opportunity to ask questions. Patient verbalized understanding of the plan and was able to repeat key elements of the plan. All questions were answered to their satisfaction.  Armanda Forand Moshe Salisbury, NP   I, Oluwaseun Bruyere Moshe Salisbury, NP, have reviewed all documentation for this visit. The documentation on 04/29/23 for the exam, diagnosis, procedures, and orders are all accurate and complete.   IF YOU HAVE BEEN REFERRED TO A SPECIALIST, IT MAY TAKE 1-2 WEEKS TO SCHEDULE/PROCESS THE REFERRAL. IF YOU HAVE NOT HEARD FROM US/SPECIALIST IN TWO WEEKS, PLEASE GIVE [place] A CALL AT [phone].   THE PATIENT IS ENCOURAGED TO PRACTICE SOCIAL DISTANCING DUE TO THE COVID-19 PANDEMIC.

## 2023-04-29 DIAGNOSIS — L509 Urticaria, unspecified: Secondary | ICD-10-CM | POA: Insufficient documentation

## 2023-04-29 DIAGNOSIS — E6609 Other obesity due to excess calories: Secondary | ICD-10-CM | POA: Insufficient documentation

## 2023-04-29 DIAGNOSIS — R21 Rash and other nonspecific skin eruption: Secondary | ICD-10-CM | POA: Insufficient documentation

## 2023-05-02 ENCOUNTER — Encounter: Payer: Self-pay | Admitting: Nurse Practitioner

## 2023-05-02 ENCOUNTER — Ambulatory Visit: Payer: Medicare PPO | Admitting: Nurse Practitioner

## 2023-05-02 VITALS — BP 120/60 | HR 77 | Temp 98.4°F | Ht 67.0 in | Wt 200.4 lb

## 2023-05-02 DIAGNOSIS — Z6831 Body mass index (BMI) 31.0-31.9, adult: Secondary | ICD-10-CM | POA: Diagnosis not present

## 2023-05-02 DIAGNOSIS — R21 Rash and other nonspecific skin eruption: Secondary | ICD-10-CM | POA: Diagnosis not present

## 2023-05-02 DIAGNOSIS — E6609 Other obesity due to excess calories: Secondary | ICD-10-CM

## 2023-05-02 MED ORDER — TRIAMCINOLONE ACETONIDE 40 MG/ML IJ SUSP
60.0000 mg | Freq: Once | INTRAMUSCULAR | Status: AC
Start: 2023-05-02 — End: 2023-05-02
  Administered 2023-05-02: 60 mg via INTRAMUSCULAR

## 2023-05-02 MED ORDER — BETAMETHASONE DIPROPIONATE 0.05 % EX CREA: TOPICAL_CREAM | Freq: Two times a day (BID) | CUTANEOUS | 0 refills | Status: AC

## 2023-05-02 NOTE — Progress Notes (Signed)
[name],acting as a Neurosurgeon for Arnette Felts, FNP.,have documented all relevant documentation on the behalf of Arnette Felts, FNP,as directed by  Arnette Felts, FNP while in the presence of Arnette Felts, FNP.    Subjective:     Patient ID: [name] , female    DOB: 04-26-1951 , 72 y.o.   MRN: [id number]   Chief Complaint  Patient presents with   Rash    HPI  Patient presents today for a follow up on a rash, patient reports her previous rash started healing but now she has new spots.   Wt Readings from Last 3 Encounters: 05/02/23 : 200 lb 6.4 oz (90.9 kg) 04/27/23 : 199 lb (90.3 kg) 04/05/23 : 200 lb 9.9 oz (91 kg)       Past Medical History:  Diagnosis Date   Arthritis    Constipation    GERD (gastroesophageal reflux disease)    H/O breast biopsy    Hypertension      Family History  Problem Relation Age of Onset   Diabetes Mother    Heart attack Father    Cancer Brother    Anesthesia problems Neg Hx      Current Outpatient Medications:    aspirin 81 MG tablet, Take 81 mg by mouth daily., Disp: , Rfl:    cetirizine (ZYRTEC ALLERGY) 10 MG tablet, Take 1 tablet (10 mg total) by mouth daily for 20 days., Disp: 20 tablet, Rfl: 0   lisinopril-hydrochlorothiazide (ZESTORETIC) 20-12.5 MG tablet, TAKE 1 TABLET BY MOUTH EVERY DAY, Disp: 90 tablet, Rfl: 1   pantoprazole (PROTONIX) 40 MG tablet, Take 40 mg by mouth every morning., Disp: , Rfl:    simvastatin (ZOCOR) 40 MG tablet, TAKE 1 TABLET BY MOUTH EVERY DAY, Disp: 90 tablet, Rfl: 1   betamethasone dipropionate 0.05 % cream, Apply topically 2 (two) times daily for 7 days., Disp: 30 g, Rfl: 0  Current Facility-Administered Medications:    triamcinolone acetonide (KENALOG-40) injection 60 mg, 60 mg, Intramuscular, Once, Arnette Felts, FNP   Allergies  Allergen Reactions   Penicillins Hives     Review of Systems  Respiratory: Negative.    Cardiovascular: Negative.   Musculoskeletal: Negative.   Skin:   Positive for rash.  Psychiatric/Behavioral: Negative.    All other systems reviewed and are negative.    Today's Vitals   05/02/23 1527  BP: 120/60  Pulse: 77  Temp: 98.4 F (36.9 C)  Weight: 200 lb 6.4 oz (90.9 kg)  Height: 5\' 7"  (1.702 m)  PainSc: 0-No pain   Body mass index is 31.39 kg/m.   Objective:  Physical Exam Vitals reviewed.  Constitutional:      General: She is not in acute distress.    Appearance: Normal appearance.  Pulmonary:     Effort: Pulmonary effort is normal. No respiratory distress.  Skin:    Findings: Erythema and rash (scattered erythematous raised areas to right axilla, left arm, lower abdomen. Some are approximately 0.55mm x 0.25mm) present.  Neurological:     General: No focal deficit present.     Mental Status: She is alert and oriented to person, place, and time.     Cranial Nerves: No cranial nerve deficit.     Motor: No weakness.  Psychiatric:        Mood and Affect: Mood normal.        Behavior: Behavior normal.        Thought Content: Thought content normal.  Judgment: Judgment normal.         Assessment And Plan:     1. Rash and nonspecific skin eruption Comments: Erythematous raised rash to right axilla, lower abdomen, and bilateral arms. Will treat with Kenalog 60 mg IM to see if more effective. Continue steroid cream. Encouraged to ensure no insects to include bed bugs in the home.  - triamcinolone acetonide (KENALOG-40) injection 60 mg  2. Class 1 obesity due to excess calories with body mass index (BMI) of 31.0 to 31.9 in adult, unspecified whether serious comorbidity present She is encouraged to strive for BMI less than 30 to decrease cardiac risk. Advised to aim for at least 150 minutes of exercise per week.   Return if symptoms worsen or fail to improve.  Patient was given opportunity to ask questions. Patient verbalized understanding of the plan and was able to repeat key elements of the plan. All questions were  answered to their satisfaction.  Arnette Felts, FNP   I, Arnette Felts, FNP, have reviewed all documentation for this visit. The documentation on 05/02/23 for the exam, diagnosis, procedures, and orders are all accurate and complete.   IF YOU HAVE BEEN REFERRED TO A SPECIALIST, IT MAY TAKE 1-2 WEEKS TO SCHEDULE/PROCESS THE REFERRAL. IF YOU HAVE NOT HEARD FROM US/SPECIALIST IN TWO WEEKS, PLEASE GIVE [place] A CALL AT [phone] X [phone].   THE PATIENT IS ENCOURAGED TO PRACTICE SOCIAL DISTANCING DUE TO THE COVID-19 PANDEMIC.

## 2023-05-02 NOTE — Patient Instructions (Signed)
[facility] [phone]  ?

## 2023-05-04 ENCOUNTER — Ambulatory Visit: Payer: Self-pay | Admitting: Nurse Practitioner

## 2023-05-04 DIAGNOSIS — H903 Sensorineural hearing loss, bilateral: Secondary | ICD-10-CM | POA: Diagnosis not present

## 2023-06-21 DIAGNOSIS — M5136 Other intervertebral disc degeneration, lumbar region: Secondary | ICD-10-CM | POA: Diagnosis not present

## 2023-06-21 DIAGNOSIS — S29012A Strain of muscle and tendon of back wall of thorax, initial encounter: Secondary | ICD-10-CM | POA: Diagnosis not present

## 2023-06-21 DIAGNOSIS — M9903 Segmental and somatic dysfunction of lumbar region: Secondary | ICD-10-CM | POA: Diagnosis not present

## 2023-06-21 DIAGNOSIS — M9901 Segmental and somatic dysfunction of cervical region: Secondary | ICD-10-CM | POA: Diagnosis not present

## 2023-06-21 DIAGNOSIS — M9902 Segmental and somatic dysfunction of thoracic region: Secondary | ICD-10-CM | POA: Diagnosis not present

## 2023-06-21 DIAGNOSIS — S138XXA Sprain of joints and ligaments of other parts of neck, initial encounter: Secondary | ICD-10-CM | POA: Diagnosis not present

## 2023-06-21 DIAGNOSIS — M48061 Spinal stenosis, lumbar region without neurogenic claudication: Secondary | ICD-10-CM | POA: Diagnosis not present

## 2023-06-21 DIAGNOSIS — M9904 Segmental and somatic dysfunction of sacral region: Secondary | ICD-10-CM | POA: Diagnosis not present

## 2023-06-28 ENCOUNTER — Ambulatory Visit: Payer: Medicare PPO | Admitting: Nurse Practitioner

## 2023-06-28 ENCOUNTER — Encounter: Payer: Self-pay | Admitting: Nurse Practitioner

## 2023-06-28 VITALS — BP 120/70 | HR 74 | Temp 98.1°F | Ht 67.0 in | Wt 193.0 lb

## 2023-06-28 DIAGNOSIS — H9193 Unspecified hearing loss, bilateral: Secondary | ICD-10-CM | POA: Diagnosis not present

## 2023-06-28 DIAGNOSIS — R21 Rash and other nonspecific skin eruption: Secondary | ICD-10-CM | POA: Diagnosis not present

## 2023-06-28 NOTE — Assessment & Plan Note (Signed)
She has a hyperpigmented area to her right lower back to flank area, flat. Will refer to Dermatologist as she has had multiple rashes in the last couple months.

## 2023-06-28 NOTE — Assessment & Plan Note (Signed)
She is to get hearing aids in the next couple of weeks.

## 2023-06-28 NOTE — Progress Notes (Signed)
I,[name], CMA,acting as a Neurosurgeon for [employer], FNP.,have documented all relevant documentation on the behalf of Arnette Felts, FNP,as directed by  Arnette Felts, FNP while in the presence of Arnette Felts, FNP.  Subjective:  Patient ID: [name] , female    DOB: 12-15-1950 , 72 y.o.   MRN: [id number]  Chief Complaint  Patient presents with   Rash    HPI  Patient presents today for a rash. Patient reports it is on her lower back on the right side, area was itching. She also had burning but has improved. Did not blister.  She has used hydrocortisone cream. She has never had shingles. She reports the rash now looks like a bruise. She is now using fragrance free detergent and unscented soap.  She is not eating a lot due to nothing tasting good.  She has seen the ENT and was told she needs hearing aids.  Rash This is a new problem. The current episode started 1 to 4 weeks ago. The problem has been gradually improving since onset. The affected locations include the back. The rash is characterized by itchiness, dryness and bruising. She was exposed to nothing. Pertinent negatives include no congestion, cough, fever or shortness of breath. Past treatments include anti-itch cream. The treatment provided moderate relief. Her past medical history is significant for varicella.     Past Medical History:  Diagnosis Date   Arthritis    Constipation    GERD (gastroesophageal reflux disease)    H/O breast biopsy    Hypertension      Family History  Problem Relation Age of Onset   Diabetes Mother    Heart attack Father    Cancer Brother    Anesthesia problems Neg Hx      Current Outpatient Medications:    aspirin 81 MG tablet, Take 81 mg by mouth daily., Disp: , Rfl:    cetirizine (ZYRTEC ALLERGY) 10 MG tablet, Take 1 tablet (10 mg total) by mouth daily for 20 days., Disp: 20 tablet, Rfl: 0   lisinopril-hydrochlorothiazide (ZESTORETIC) 20-12.5 MG tablet, TAKE 1 TABLET BY  MOUTH EVERY DAY, Disp: 90 tablet, Rfl: 1   pantoprazole (PROTONIX) 40 MG tablet, Take 40 mg by mouth every morning., Disp: , Rfl:    simvastatin (ZOCOR) 40 MG tablet, TAKE 1 TABLET BY MOUTH EVERY DAY, Disp: 90 tablet, Rfl: 1   Allergies  Allergen Reactions   Penicillins Hives     Review of Systems  Constitutional:  Negative for fever.  HENT:  Negative for congestion.   Respiratory:  Negative for cough and shortness of breath.   Cardiovascular: Negative.   Skin:  Positive for rash.  Neurological: Negative.   Psychiatric/Behavioral: Negative.       Today's Vitals   06/28/23 1430  BP: 120/70  Pulse: 74  Temp: 98.1 F (36.7 C)  Weight: 193 lb (87.5 kg)  Height: 5\' 7"  (1.702 m)  PainSc: 0-No pain   Body mass index is 30.23 kg/m.  Wt Readings from Last 3 Encounters:  06/28/23 193 lb (87.5 kg)  05/02/23 200 lb 6.4 oz (90.9 kg)  04/27/23 199 lb (90.3 kg)     Objective:  Physical Exam Vitals reviewed.  Constitutional:      General: She is not in acute distress.    Appearance: Normal appearance. She is obese.  Cardiovascular:     Pulses: Normal pulses.     Heart sounds: Normal heart sounds.  Pulmonary:     Effort: Pulmonary  effort is normal. No respiratory distress.     Breath sounds: No wheezing.  Skin:    Coloration: Skin is not pale.     Findings: Bruising (right low back flank area patch appearance) and rash present. No erythema.  Neurological:     General: No focal deficit present.     Mental Status: She is alert and oriented to person, place, and time.     Cranial Nerves: No cranial nerve deficit.     Motor: No weakness.  Psychiatric:        Mood and Affect: Mood normal.        Behavior: Behavior normal.        Thought Content: Thought content normal.        Judgment: Judgment normal.         Assessment And Plan:  Rash and nonspecific skin eruption Assessment & Plan: She has a hyperpigmented area to her right lower back to flank area, flat. Will refer  to Dermatologist as she has had multiple rashes in the last couple months.   Orders: -     Ambulatory referral to Dermatology  Decreased hearing of both ears Assessment & Plan: She is to get hearing aids in the next couple of weeks.    Return if symptoms worsen or fail to improve, for schedule f/u appt in Sept.  Patient was given opportunity to ask questions. Patient verbalized understanding of the plan and was able to repeat key elements of the plan. All questions were answered to their satisfaction.  Arnette Felts, FNP  I, Arnette Felts, FNP, have reviewed all documentation for this visit. The documentation on 06/28/23 for the exam, diagnosis, procedures, and orders are all accurate and complete.   IF YOU HAVE BEEN REFERRED TO A SPECIALIST, IT MAY TAKE 1-2 WEEKS TO SCHEDULE/PROCESS THE REFERRAL. IF YOU HAVE NOT HEARD FROM US/SPECIALIST IN TWO WEEKS, PLEASE GIVE [place] A CALL AT [phone].   THE PATIENT IS ENCOURAGED TO PRACTICE SOCIAL DISTANCING DUE TO THE COVID-19 PANDEMIC.

## 2023-07-02 ENCOUNTER — Other Ambulatory Visit: Payer: Self-pay | Admitting: Internal Medicine

## 2023-07-02 DIAGNOSIS — I10 Essential (primary) hypertension: Secondary | ICD-10-CM

## 2023-07-02 DIAGNOSIS — E782 Mixed hyperlipidemia: Secondary | ICD-10-CM

## 2023-07-26 DIAGNOSIS — S29012A Strain of muscle and tendon of back wall of thorax, initial encounter: Secondary | ICD-10-CM | POA: Diagnosis not present

## 2023-07-26 DIAGNOSIS — M9903 Segmental and somatic dysfunction of lumbar region: Secondary | ICD-10-CM | POA: Diagnosis not present

## 2023-07-26 DIAGNOSIS — M48061 Spinal stenosis, lumbar region without neurogenic claudication: Secondary | ICD-10-CM | POA: Diagnosis not present

## 2023-07-26 DIAGNOSIS — M5136 Other intervertebral disc degeneration, lumbar region: Secondary | ICD-10-CM | POA: Diagnosis not present

## 2023-07-26 DIAGNOSIS — M9901 Segmental and somatic dysfunction of cervical region: Secondary | ICD-10-CM | POA: Diagnosis not present

## 2023-07-26 DIAGNOSIS — S138XXA Sprain of joints and ligaments of other parts of neck, initial encounter: Secondary | ICD-10-CM | POA: Diagnosis not present

## 2023-07-26 DIAGNOSIS — M9902 Segmental and somatic dysfunction of thoracic region: Secondary | ICD-10-CM | POA: Diagnosis not present

## 2023-07-26 DIAGNOSIS — M9904 Segmental and somatic dysfunction of sacral region: Secondary | ICD-10-CM | POA: Diagnosis not present

## 2023-08-09 DIAGNOSIS — N6082 Other benign mammary dysplasias of left breast: Secondary | ICD-10-CM | POA: Diagnosis not present

## 2023-08-22 DIAGNOSIS — H903 Sensorineural hearing loss, bilateral: Secondary | ICD-10-CM | POA: Diagnosis not present

## 2023-12-30 ENCOUNTER — Other Ambulatory Visit: Payer: Self-pay | Admitting: Internal Medicine

## 2023-12-30 DIAGNOSIS — E782 Mixed hyperlipidemia: Secondary | ICD-10-CM

## 2023-12-31 ENCOUNTER — Other Ambulatory Visit: Payer: Self-pay | Admitting: Internal Medicine

## 2023-12-31 DIAGNOSIS — I10 Essential (primary) hypertension: Secondary | ICD-10-CM

## 2024-01-04 DIAGNOSIS — I10 Essential (primary) hypertension: Secondary | ICD-10-CM | POA: Diagnosis not present

## 2024-01-04 DIAGNOSIS — E782 Mixed hyperlipidemia: Secondary | ICD-10-CM | POA: Diagnosis not present

## 2024-01-04 DIAGNOSIS — K219 Gastro-esophageal reflux disease without esophagitis: Secondary | ICD-10-CM | POA: Diagnosis not present

## 2024-01-30 DIAGNOSIS — Z1231 Encounter for screening mammogram for malignant neoplasm of breast: Secondary | ICD-10-CM | POA: Diagnosis not present

## 2024-01-30 LAB — HM MAMMOGRAPHY

## 2024-02-22 ENCOUNTER — Other Ambulatory Visit: Payer: Self-pay

## 2024-02-22 DIAGNOSIS — E782 Mixed hyperlipidemia: Secondary | ICD-10-CM

## 2024-02-22 DIAGNOSIS — I10 Essential (primary) hypertension: Secondary | ICD-10-CM

## 2024-02-22 MED ORDER — LISINOPRIL-HYDROCHLOROTHIAZIDE 20-12.5 MG PO TABS
1.0000 | ORAL_TABLET | Freq: Every day | ORAL | 1 refills | Status: DC
Start: 2024-02-22 — End: 2024-02-23

## 2024-02-22 MED ORDER — SIMVASTATIN 40 MG PO TABS
40.0000 mg | ORAL_TABLET | Freq: Every day | ORAL | 1 refills | Status: DC
Start: 2024-02-22 — End: 2024-02-23

## 2024-02-23 ENCOUNTER — Other Ambulatory Visit: Payer: Self-pay

## 2024-02-23 DIAGNOSIS — I10 Essential (primary) hypertension: Secondary | ICD-10-CM

## 2024-02-23 DIAGNOSIS — E782 Mixed hyperlipidemia: Secondary | ICD-10-CM

## 2024-02-23 MED ORDER — LISINOPRIL-HYDROCHLOROTHIAZIDE 20-12.5 MG PO TABS
1.0000 | ORAL_TABLET | Freq: Every day | ORAL | 1 refills | Status: DC
Start: 2024-02-23 — End: 2024-02-23

## 2024-02-23 MED ORDER — LISINOPRIL-HYDROCHLOROTHIAZIDE 20-12.5 MG PO TABS
1.0000 | ORAL_TABLET | Freq: Every day | ORAL | 1 refills | Status: DC
Start: 2024-02-23 — End: 2024-08-05

## 2024-02-23 MED ORDER — SIMVASTATIN 40 MG PO TABS
40.0000 mg | ORAL_TABLET | Freq: Every day | ORAL | 1 refills | Status: DC
Start: 2024-02-23 — End: 2024-08-05

## 2024-03-04 ENCOUNTER — Ambulatory Visit: Payer: Medicare PPO | Admitting: Nurse Practitioner

## 2024-03-04 ENCOUNTER — Encounter: Payer: Self-pay | Admitting: Nurse Practitioner

## 2024-03-04 VITALS — BP 132/74 | HR 63 | Temp 98.1°F | Ht 67.0 in | Wt 198.2 lb

## 2024-03-04 DIAGNOSIS — E782 Mixed hyperlipidemia: Secondary | ICD-10-CM | POA: Diagnosis not present

## 2024-03-04 DIAGNOSIS — E66811 Obesity, class 1: Secondary | ICD-10-CM | POA: Diagnosis not present

## 2024-03-04 DIAGNOSIS — M546 Pain in thoracic spine: Secondary | ICD-10-CM

## 2024-03-04 DIAGNOSIS — G8929 Other chronic pain: Secondary | ICD-10-CM | POA: Diagnosis not present

## 2024-03-04 DIAGNOSIS — Z0000 Encounter for general adult medical examination without abnormal findings: Secondary | ICD-10-CM

## 2024-03-04 DIAGNOSIS — I10 Essential (primary) hypertension: Secondary | ICD-10-CM

## 2024-03-04 DIAGNOSIS — R7303 Prediabetes: Secondary | ICD-10-CM

## 2024-03-04 DIAGNOSIS — R82998 Other abnormal findings in urine: Secondary | ICD-10-CM

## 2024-03-04 DIAGNOSIS — H9193 Unspecified hearing loss, bilateral: Secondary | ICD-10-CM | POA: Diagnosis not present

## 2024-03-04 DIAGNOSIS — Z79899 Other long term (current) drug therapy: Secondary | ICD-10-CM | POA: Diagnosis not present

## 2024-03-04 DIAGNOSIS — Z6831 Body mass index (BMI) 31.0-31.9, adult: Secondary | ICD-10-CM

## 2024-03-04 DIAGNOSIS — Z974 Presence of external hearing-aid: Secondary | ICD-10-CM

## 2024-03-04 DIAGNOSIS — Z23 Encounter for immunization: Secondary | ICD-10-CM | POA: Diagnosis not present

## 2024-03-04 DIAGNOSIS — E6609 Other obesity due to excess calories: Secondary | ICD-10-CM | POA: Diagnosis not present

## 2024-03-04 LAB — POCT URINALYSIS DIP (CLINITEK)
Bilirubin, UA: NEGATIVE
Blood, UA: NEGATIVE
Glucose, UA: NEGATIVE mg/dL
Ketones, POC UA: NEGATIVE mg/dL
Nitrite, UA: NEGATIVE
POC PROTEIN,UA: NEGATIVE
Spec Grav, UA: 1.015 (ref 1.010–1.025)
Urobilinogen, UA: 0.2 U/dL
pH, UA: 7.5 (ref 5.0–8.0)

## 2024-03-04 MED ORDER — METHOCARBAMOL 500 MG PO TABS: 500.0000 mg | ORAL_TABLET | Freq: Three times a day (TID) | ORAL | 0 refills | Status: DC | PRN

## 2024-03-04 NOTE — Progress Notes (Signed)
 [place], CMA,acting as a Neurosurgeon for Susanna Epley, FNP.,have documented all relevant documentation on the behalf of Susanna Epley, FNP,as directed by  Susanna Epley, FNP while in the presence of Susanna Epley, FNP.  Subjective:    Patient ID: [name] , female    DOB: [birth year] , 73 y.o.   MRN: [id number]  Chief Complaint  Patient presents with   Annual Exam    HPI  Patient presents today for HM, Patient reports compliance with medication. Patient denies any chest pain, SOB, or headaches. Patient has no concerns today. Continues to be followed by the Chiropractor. [name] was also seen by Dr. Tova Fresh, ENT and her eye doctor.   Continues to have midback pain.      Past Medical History:  Diagnosis Date   Arthritis    Constipation    GERD (gastroesophageal reflux disease)    H/O breast biopsy    Hypertension      Family History  Problem Relation Age of Onset   Diabetes Mother    Heart attack Father    Cancer Brother    Anesthesia problems Neg Hx      Current Outpatient Medications:    aspirin 81 MG tablet, Take 81 mg by mouth daily., Disp: , Rfl:    cetirizine (ZYRTEC ALLERGY) 10 MG tablet, Take 1 tablet (10 mg total) by mouth daily for 20 days., Disp: 20 tablet, Rfl: 0   famotidine (PEPCID) 40 MG tablet, Take 40 mg by mouth 2 (two) times daily., Disp: , Rfl:    lisinopril-hydrochlorothiazide (ZESTORETIC) 20-12.5 MG tablet, Take 1 tablet by mouth daily., Disp: 90 tablet, Rfl: 1   methocarbamol (ROBAXIN) 500 MG tablet, Take 1 tablet (500 mg total) by mouth every 8 (eight) hours as needed for muscle spasms., Disp: 20 tablet, Rfl: 0   pantoprazole (PROTONIX) 40 MG tablet, Take 40 mg by mouth every morning., Disp: , Rfl:    simvastatin (ZOCOR) 40 MG tablet, Take 1 tablet (40 mg total) by mouth daily., Disp: 90 tablet, Rfl: 1   Allergies  Allergen Reactions   Penicillins Hives      The patient states [name] uses status post hysterectomy for birth control. No LMP recorded.  Patient has had a hysterectomy.   Negative for: breast discharge, breast lump(s), breast pain and breast self exam. Associated symptoms include abnormal vaginal bleeding. Pertinent negatives include abnormal bleeding (hematology), anxiety, decreased libido, depression, difficulty falling sleep, dyspareunia, history of infertility, nocturia, sexual dysfunction, sleep disturbances, urinary incontinence, urinary urgency, vaginal discharge and vaginal itching. Diet regular.; nothing is good to her anymore.  The patient states her exercise level is minimal - [name] is doing some stretches the chiropractor has given her. [name] is using a legcercise.   The patient's tobacco use is:  Social History   Tobacco Use  Smoking Status Never  Smokeless Tobacco Never  [name] has been exposed to passive smoke. The patient's alcohol use is:  Social History   Substance and Sexual Activity  Alcohol Use No   Review of Systems  Constitutional: Negative.   HENT: Negative.    Eyes: Negative.   Respiratory: Negative.    Cardiovascular: Negative.   Gastrointestinal: Negative.   Endocrine: Negative.   Genitourinary: Negative.   Musculoskeletal: Negative.   Skin: Negative.   Allergic/Immunologic: Negative.   Neurological: Negative.   Hematological: Negative.   Psychiatric/Behavioral: Negative.       Today's Vitals   03/04/24 1101  BP: 132/74  Pulse: 63  Temp:  98.1 F (36.7 C)  TempSrc: Oral  Weight: 198 lb 3.2 oz (89.9 kg)  Height: 5\' 7"  (1.702 m)  PainSc: 7   PainLoc: Back   Body mass index is 31.04 kg/m.  Wt Readings from Last 3 Encounters:  03/04/24 198 lb 3.2 oz (89.9 kg)  06/28/23 193 lb (87.5 kg)  05/02/23 200 lb 6.4 oz (90.9 kg)     Objective:  Physical Exam Vitals and nursing note reviewed.  Constitutional:      General: [name] is not in acute distress.    Appearance: Normal appearance. [name] is well-developed. [name] is obese.  HENT:     Head: Normocephalic and atraumatic.     Right Ear:  Hearing, tympanic membrane, ear canal and external ear normal. There is no impacted cerumen.     Left Ear: Hearing, tympanic membrane, ear canal and external ear normal. There is no impacted cerumen.     Nose: Nose normal.     Mouth/Throat:     Mouth: Mucous membranes are moist.  Eyes:     General: Lids are normal.     Extraocular Movements: Extraocular movements intact.     Conjunctiva/sclera: Conjunctivae normal.     Pupils: Pupils are equal, round, and reactive to light.     Funduscopic exam:    Right eye: No papilledema.        Left eye: No papilledema.  Neck:     Thyroid: No thyroid mass.     Vascular: No carotid bruit.  Cardiovascular:     Rate and Rhythm: Normal rate and regular rhythm.     Pulses: Normal pulses.     Heart sounds: Normal heart sounds. No murmur heard. Pulmonary:     Effort: Pulmonary effort is normal. No respiratory distress.     Breath sounds: Normal breath sounds. No wheezing.  Chest:     Chest wall: No mass.  Breasts:    Tanner Score is 5.     Right: Normal. No mass or tenderness.     Left: Normal. No mass or tenderness.  Abdominal:     General: Abdomen is flat. Bowel sounds are normal. There is no distension.     Palpations: Abdomen is soft.     Tenderness: There is no abdominal tenderness.  Musculoskeletal:        General: Tenderness (thoracic area bilaterally) present. No swelling. Normal range of motion.     Cervical back: Full passive range of motion without pain, normal range of motion and neck supple.     Right lower leg: No edema.     Left lower leg: No edema.  Lymphadenopathy:     Upper Body:     Right upper body: No supraclavicular, axillary or pectoral adenopathy.     Left upper body: No supraclavicular, axillary or pectoral adenopathy.  Skin:    General: Skin is warm and dry.     Capillary Refill: Capillary refill takes less than 2 seconds.  Neurological:     General: No focal deficit present.     Mental Status: [name] is alert and  oriented to person, place, and time.     Cranial Nerves: No cranial nerve deficit.     Sensory: No sensory deficit.     Motor: No weakness.  Psychiatric:        Mood and Affect: Mood normal.        Behavior: Behavior normal.        Thought Content: Thought content normal.  Judgment: Judgment normal.         Assessment And Plan:     Encounter for annual health examination Assessment & Plan: Behavior modifications discussed and diet history reviewed.   Pt will continue to exercise regularly and modify diet with low GI, plant based foods and decrease intake of processed foods.  Recommend intake of daily multivitamin, Vitamin D, and calcium.  Recommend mammogram and colonoscopy for preventive screenings, as well as recommend immunizations that include influenza, TDAP, and Shingles    Essential hypertension Assessment & Plan: Chronic, blood pressure controlled continue current medications. EKG done with NSR HR 73  Orders: -     EKG 12-Lead -     POCT URINALYSIS DIP (CLINITEK) -     Microalbumin / creatinine urine ratio -     CMP14+EGFR  Prediabetes Assessment & Plan: HgbA1c is stable, continue focusing on healthy lifestyle and exercising regularly  Orders: -     Hemoglobin A1c  Mixed hyperlipidemia Assessment & Plan: Cholesterol levels have been normal, continue current medications.   Orders: -     Lipid panel  Chronic bilateral thoracic back pain Assessment & Plan: Tenderness to palpation, will check an xray for any structural damage. Will also treat with muscle relaxer   Leukocytes in urine Assessment & Plan: Trace urine, will send order for urine culture.   Orders: -     Urine Culture  Bilateral hearing loss, unspecified hearing loss type  Does use hearing aid  COVID-19 vaccine administered Assessment & Plan: Covid 19 vaccine given in office observed for 15 minutes without any adverse reaction   Orders: -     [employer] Comirnaty Covid-19 Vaccine  40yrs & older  Class 1 obesity due to excess calories with body mass index (BMI) of 31.0 to 31.9 in adult, unspecified whether serious comorbidity present Assessment & Plan: [name] is encouraged to strive for BMI less than 30 to decrease cardiac risk. Advised to aim for at least 150 minutes of exercise per week.    Other long term (current) drug therapy -     CBC with Differential/Platelet  Other orders -     Methocarbamol; Take 1 tablet (500 mg total) by mouth every 8 (eight) hours as needed for muscle spasms.  Dispense: 20 tablet; Refill: 0     Return for 1 year physical, 6 month bp check. Patient was given opportunity to ask questions. Patient verbalized understanding of the plan and was able to repeat key elements of the plan. All questions were answered to their satisfaction.   Susanna Epley, FNP  I, Susanna Epley, FNP, have reviewed all documentation for this visit. The documentation on 03/04/24 for the exam, diagnosis, procedures, and orders are all accurate and complete.

## 2024-03-04 NOTE — Patient Instructions (Addendum)
 Wear compression socks during the day  Do not take the muscle relaxer if you are driving or operating heavy machinery

## 2024-03-05 LAB — CMP14+EGFR
ALT: 17 IU/L (ref 0–32)
AST: 40 IU/L (ref 0–40)
Albumin: 4.2 g/dL (ref 3.8–4.8)
Alkaline Phosphatase: 93 IU/L (ref 44–121)
BUN/Creatinine Ratio: 16 (ref 12–28)
BUN: 17 mg/dL (ref 8–27)
Bilirubin Total: 0.3 mg/dL (ref 0.0–1.2)
CO2: 19 mmol/L — ABNORMAL LOW (ref 20–29)
Calcium: 9.4 mg/dL (ref 8.7–10.3)
Chloride: 98 mmol/L (ref 96–106)
Creatinine, Ser: 1.08 mg/dL — ABNORMAL HIGH (ref 0.57–1.00)
Globulin, Total: 2.8 g/dL (ref 1.5–4.5)
Glucose: 72 mg/dL (ref 70–99)
Potassium: 4.5 mmol/L (ref 3.5–5.2)
Sodium: 139 mmol/L (ref 134–144)
Total Protein: 7 g/dL (ref 6.0–8.5)
eGFR: 54 mL/min/{1.73_m2} — ABNORMAL LOW (ref >59)

## 2024-03-05 LAB — LIPID PANEL
Chol/HDL Ratio: 3.6 ratio (ref 0.0–4.4)
Cholesterol, Total: 185 mg/dL (ref 100–199)
HDL: 52 mg/dL (ref >39)
LDL Chol Calc (NIH): 112 mg/dL — ABNORMAL HIGH (ref 0–99)
Triglycerides: 118 mg/dL (ref 0–149)
VLDL Cholesterol Cal: 21 mg/dL (ref 5–40)

## 2024-03-05 LAB — HEMOGLOBIN A1C
Est. average glucose Bld gHb Est-mCnc: 117 mg/dL
Hgb A1c MFr Bld: 5.7 % — ABNORMAL HIGH (ref 4.8–5.6)

## 2024-03-05 LAB — CBC WITH DIFFERENTIAL/PLATELET
Basophils Absolute: 0 10*3/uL (ref 0.0–0.2)
Basos: 1 %
EOS (ABSOLUTE): 0.2 10*3/uL (ref 0.0–0.4)
Eos: 3 %
Hematocrit: 37.8 % (ref 34.0–46.6)
Hemoglobin: 12.2 g/dL (ref 11.1–15.9)
Immature Grans (Abs): 0 10*3/uL (ref 0.0–0.1)
Immature Granulocytes: 0 %
Lymphocytes Absolute: 2.3 10*3/uL (ref 0.7–3.1)
Lymphs: 39 %
MCH: 29.8 pg (ref 26.6–33.0)
MCHC: 32.3 g/dL (ref 31.5–35.7)
MCV: 92 fL (ref 79–97)
Monocytes Absolute: 0.5 10*3/uL (ref 0.1–0.9)
Monocytes: 8 %
Neutrophils Absolute: 2.9 10*3/uL (ref 1.4–7.0)
Neutrophils: 49 %
Platelets: 158 10*3/uL (ref 150–450)
RBC: 4.1 x10E6/uL (ref 3.77–5.28)
RDW: 12.8 % (ref 11.7–15.4)
WBC: 5.9 10*3/uL (ref 3.4–10.8)

## 2024-03-05 LAB — MICROALBUMIN / CREATININE URINE RATIO
Creatinine, Urine: 26.1 mg/dL
Microalb/Creat Ratio: <11 mg/g{creat} (ref 0–29)
Microalbumin, Urine: <3 ug/mL

## 2024-03-05 LAB — URINE CULTURE

## 2024-03-06 ENCOUNTER — Encounter: Payer: Self-pay | Admitting: Nurse Practitioner

## 2024-03-06 ENCOUNTER — Other Ambulatory Visit: Payer: Self-pay | Admitting: Nurse Practitioner

## 2024-03-06 ENCOUNTER — Ambulatory Visit
Admission: RE | Admit: 2024-03-06 | Discharge: 2024-03-06 | Disposition: A | Discharge status: Home / Self Care | Source: Ambulatory Visit | Attending: Nurse Practitioner | Admitting: Nurse Practitioner

## 2024-03-06 DIAGNOSIS — Z23 Encounter for immunization: Secondary | ICD-10-CM

## 2024-03-06 DIAGNOSIS — M546 Pain in thoracic spine: Secondary | ICD-10-CM | POA: Diagnosis not present

## 2024-03-06 DIAGNOSIS — H9193 Unspecified hearing loss, bilateral: Secondary | ICD-10-CM

## 2024-03-06 DIAGNOSIS — R7303 Prediabetes: Secondary | ICD-10-CM

## 2024-03-06 DIAGNOSIS — Z974 Presence of external hearing-aid: Secondary | ICD-10-CM

## 2024-03-06 DIAGNOSIS — Z79899 Other long term (current) drug therapy: Secondary | ICD-10-CM

## 2024-03-06 DIAGNOSIS — G8929 Other chronic pain: Secondary | ICD-10-CM

## 2024-03-06 DIAGNOSIS — E66811 Obesity, class 1: Secondary | ICD-10-CM

## 2024-03-06 DIAGNOSIS — Z0000 Encounter for general adult medical examination without abnormal findings: Secondary | ICD-10-CM

## 2024-03-06 DIAGNOSIS — E782 Mixed hyperlipidemia: Secondary | ICD-10-CM

## 2024-03-06 DIAGNOSIS — I10 Essential (primary) hypertension: Secondary | ICD-10-CM

## 2024-03-06 DIAGNOSIS — R82998 Other abnormal findings in urine: Secondary | ICD-10-CM

## 2024-03-12 DIAGNOSIS — R82998 Other abnormal findings in urine: Secondary | ICD-10-CM | POA: Insufficient documentation

## 2024-03-12 DIAGNOSIS — Z0000 Encounter for general adult medical examination without abnormal findings: Secondary | ICD-10-CM | POA: Insufficient documentation

## 2024-03-12 NOTE — Assessment & Plan Note (Signed)
 Trace urine, will send order for urine culture.

## 2024-03-12 NOTE — Assessment & Plan Note (Signed)

## 2024-03-12 NOTE — Assessment & Plan Note (Signed)
 HgbA1c is stable, continue focusing on healthy lifestyle and exercising regularly

## 2024-03-12 NOTE — Assessment & Plan Note (Signed)
 Cholesterol levels have been normal, continue current medications.

## 2024-03-12 NOTE — Assessment & Plan Note (Signed)
 Tenderness to palpation, will check an xray for any structural damage. Will also treat with muscle relaxer

## 2024-03-12 NOTE — Assessment & Plan Note (Signed)
 She is encouraged to strive for BMI less than 30 to decrease cardiac risk. Advised to aim for at least 150 minutes of exercise per week.

## 2024-03-12 NOTE — Assessment & Plan Note (Addendum)
 Chronic, blood pressure controlled continue current medications. EKG done with NSR HR 73

## 2024-03-12 NOTE — Assessment & Plan Note (Signed)
 Covid 19 vaccine given in office observed for 15 minutes without any adverse reaction

## 2024-03-20 ENCOUNTER — Encounter: Payer: Self-pay | Admitting: Nurse Practitioner

## 2024-03-27 DIAGNOSIS — S138XXA Sprain of joints and ligaments of other parts of neck, initial encounter: Secondary | ICD-10-CM | POA: Diagnosis not present

## 2024-03-27 DIAGNOSIS — M5125 Other intervertebral disc displacement, thoracolumbar region: Secondary | ICD-10-CM | POA: Diagnosis not present

## 2024-03-27 DIAGNOSIS — M9904 Segmental and somatic dysfunction of sacral region: Secondary | ICD-10-CM | POA: Diagnosis not present

## 2024-03-27 DIAGNOSIS — M9902 Segmental and somatic dysfunction of thoracic region: Secondary | ICD-10-CM | POA: Diagnosis not present

## 2024-03-27 DIAGNOSIS — M9901 Segmental and somatic dysfunction of cervical region: Secondary | ICD-10-CM | POA: Diagnosis not present

## 2024-03-27 DIAGNOSIS — M48061 Spinal stenosis, lumbar region without neurogenic claudication: Secondary | ICD-10-CM | POA: Diagnosis not present

## 2024-03-27 DIAGNOSIS — M9903 Segmental and somatic dysfunction of lumbar region: Secondary | ICD-10-CM | POA: Diagnosis not present

## 2024-04-10 ENCOUNTER — Ambulatory Visit: Payer: Self-pay | Admitting: Nurse Practitioner

## 2024-04-10 ENCOUNTER — Ambulatory Visit (INDEPENDENT_AMBULATORY_CARE_PROVIDER_SITE_OTHER): Payer: Medicare PPO

## 2024-04-10 VITALS — BP 118/64 | HR 83 | Temp 99.0°F | Ht 67.0 in | Wt 199.2 lb

## 2024-04-10 DIAGNOSIS — Z0000 Encounter for general adult medical examination without abnormal findings: Secondary | ICD-10-CM | POA: Diagnosis not present

## 2024-04-10 NOTE — Patient Instructions (Signed)
 [name] , Thank you for taking time out of your busy schedule to complete your Annual Wellness Visit with me. I enjoyed our conversation and look forward to speaking with you again next year. I, as well as your care team,  appreciate your ongoing commitment to your health goals. Please review the following plan we discussed and let me know if I can assist you in the future. Your Game plan/ To Do List    Referrals: If you haven't heard from the office you've been referred to, please reach out to them at the phone provided.  N/a Follow up Visits: Next Medicare AWV with our clinical staff: 04/23/2025 at 10:00   Have you seen your provider in the last 6 months (3 months if uncontrolled diabetes)? Yes Next Office Visit with your provider: 09/03/2024 at 10:20  Clinician Recommendations:  Aim for 30 minutes of exercise or brisk walking, 6-8 glasses of water, and 5 servings of fruits and vegetables each day.       This is a list of the screening recommended for you and due dates:  Health Maintenance  Topic Date Due   COVID-19 Vaccine (9 - 2024-25 season) 04/29/2024   Flu Shot  06/28/2024   Mammogram  01/29/2025   Medicare Annual Wellness Visit  04/10/2025   Colon Cancer Screening  05/03/2032   DTaP/Tdap/Td vaccine (3 - Td or Tdap) 01/11/2033   Pneumonia Vaccine  Completed   DEXA scan (bone density measurement)  Completed   Hepatitis C Screening  Completed   Zoster (Shingles) Vaccine  Completed   HPV Vaccine  Aged Out   Meningitis B Vaccine  Aged Out    Advanced directives: (Declined) Advance directive discussed with you today. Even though you declined this today, please call our office should you change your mind, and we can give you the proper paperwork for you to fill out. Advance Care Planning is important because it:  [x]  Makes sure you receive the medical care that is consistent with your values, goals, and preferences  [x]  It provides guidance to your family and loved ones and  reduces their decisional burden about whether or not they are making the right decisions based on your wishes.  Follow the link provided in your after visit summary or read over the paperwork we have mailed to you to help you started getting your Advance Directives in place. If you need assistance in completing these, please reach out to us  so that we can help you!  See attachments for Preventive Care and Fall Prevention Tips.

## 2024-04-10 NOTE — Progress Notes (Signed)
 Subjective:   [name] is a 73 y.o. who presents for a Medicare Wellness preventive visit.  As a reminder, Annual Wellness Visits don't include a physical exam, and some assessments may be limited, especially if this visit is performed virtually. We may recommend an in-person visit if needed.  Visit Complete: In person    Persons Participating in Visit: Patient.  AWV Questionnaire: No: Patient Medicare AWV questionnaire was not completed prior to this visit.  Cardiac Risk Factors include: advanced age (>61men, >74 women);dyslipidemia;hypertension     Objective:     Today's Vitals   04/10/24 1034  BP: 118/64  Pulse: 83  Temp: 99 F (37.2 C)  TempSrc: Oral  SpO2: 96%  Weight: 199 lb 3.2 oz (90.4 kg)  Height: 5\' 7"  (1.702 m)  PainSc: 4    Body mass index is 31.2 kg/m.     04/10/2024   10:41 AM 04/05/2023   10:55 AM 03/23/2022   12:19 PM 03/17/2021    4:01 PM 02/12/2020    3:17 PM 01/23/2019    3:00 PM 02/08/2012    5:19 PM  Advanced Directives  Does Patient Have a Medical Advance Directive? No No No No No No Patient does not have advance directive;Patient would not like information  Would patient like information on creating a medical advance directive? No - Patient declined No - Patient declined Yes (MAU/Ambulatory/Procedural Areas - Information given) No - Patient declined No - Patient declined Yes (MAU/Ambulatory/Procedural Areas - Information given)   Pre-existing out of facility DNR order (yellow form or pink MOST form)       No    Current Medications (verified) Outpatient Encounter Medications as of 04/10/2024  Medication Sig   aspirin 81 MG tablet Take 81 mg by mouth daily.   famotidine (PEPCID) 40 MG tablet Take 40 mg by mouth 2 (two) times daily.   lisinopril -hydrochlorothiazide  (ZESTORETIC ) 20-12.5 MG tablet Take 1 tablet by mouth daily.   methocarbamol  (ROBAXIN ) 500 MG tablet Take 1 tablet (500 mg total) by mouth every 8 (eight) hours as needed for  muscle spasms.   pantoprazole  (PROTONIX ) 40 MG tablet Take 40 mg by mouth every morning.   simvastatin  (ZOCOR ) 40 MG tablet Take 1 tablet (40 mg total) by mouth daily.   cetirizine  (ZYRTEC  ALLERGY) 10 MG tablet Take 1 tablet (10 mg total) by mouth daily for 20 days.   No facility-administered encounter medications on file as of 04/10/2024.    Allergies (verified) Penicillins   History: Past Medical History:  Diagnosis Date   Arthritis    Constipation    GERD (gastroesophageal reflux disease)    H/O breast biopsy    Hypertension    Past Surgical History:  Procedure Laterality Date   ABDOMINAL HYSTERECTOMY     BREAST BIOPSY Left 01/2013   BREAST SURGERY     bio  neg cancer   EYE SURGERY  2019   bilateral cataract surgery   Family History  Problem Relation Age of Onset   Diabetes Mother    Heart attack Father    Cancer Brother    Anesthesia problems Neg Hx    Social History   Socioeconomic History   Marital status: Widowed    Spouse name: Not on file   Number of children: Not on file   Years of education: Not on file   Highest education level: Not on file  Occupational History   Occupation: retired  Tobacco Use   Smoking status: Never  Smokeless tobacco: Never  Vaping Use   Vaping status: Never Used  Substance and Sexual Activity   Alcohol use: No   Drug use: No   Sexual activity: Not Currently  Other Topics Concern   Not on file  Social History Narrative   Not on file   Social Drivers of Health   Financial Resource Strain: Low Risk  (04/10/2024)   Overall Financial Resource Strain (CARDIA)    Difficulty of Paying Living Expenses: Not hard at all  Food Insecurity: No Food Insecurity (04/10/2024)   Hunger Vital Sign    Worried About Running Out of Food in the Last Year: Never true    Ran Out of Food in the Last Year: Never true  Transportation Needs: No Transportation Needs (04/10/2024)   PRAPARE - Administrator, Civil Service (Medical): No     Lack of Transportation (Non-Medical): No  Physical Activity: Inactive (04/10/2024)   Exercise Vital Sign    Days of Exercise per Week: 0 days    Minutes of Exercise per Session: 0 min  Stress: No Stress Concern Present (04/10/2024)   [employer] of Occupational Health - Occupational Stress Questionnaire    Feeling of Stress : Not at all  Social Connections: Moderately Isolated (04/10/2024)   Social Connection and Isolation Panel [NHANES]    Frequency of Communication with Friends and Family: Three times a week    Frequency of Social Gatherings with Friends and Family: Once a week    Attends Religious Services: More than 4 times per year    Active Member of [employer] or Organizations: No    Attends Banker Meetings: Never    Marital Status: Widowed    Tobacco Counseling Counseling given: Not Answered    Clinical Intake:  Pre-visit preparation completed: Yes  Pain : 0-10 Pain Score: 4  Pain Type: Chronic pain Pain Location: Back Pain Orientation: Lower Pain Descriptors / Indicators: Aching Pain Onset: More than a month ago Pain Frequency: Intermittent     Nutritional Status: BMI > 30  Obese Nutritional Risks: None Diabetes: No  Lab Results  Component Value Date   HGBA1C 5.7 (H) 03/04/2024   HGBA1C 5.7 (H) 04/05/2023   HGBA1C 5.9 (H) 02/27/2023     How often do you need to have someone help you when you read instructions, pamphlets, or other written materials from your doctor or pharmacy?: 1 - Never  Interpreter Needed?: No  Information entered by :: NAllen LPN   Activities of Daily Living     04/10/2024   10:35 AM  In your present state of health, do you have any difficulty performing the following activities:  Hearing? 1  Comment has hearing aids  Vision? 1  Comment double vision sometimes  Difficulty concentrating or making decisions? 0  Walking or climbing stairs? 1  Dressing or bathing? 0  Doing errands, shopping? 0  Preparing Food  and eating ? N  Using the Toilet? N  In the past six months, have you accidently leaked urine? N  Do you have problems with loss of bowel control? N  Managing your Medications? N  Managing your Finances? N  Housekeeping or managing your Housekeeping? N    Patient Care Team: Susanna Epley, FNP as PCP - General (General Practice) [place], Ritesh, OD (Optometry)  Indicate any recent Medical Services you may have received from other than [facility] providers in the past year (date may be approximate).     Assessment:    This  is a routine wellness examination for [place].  Hearing/Vision screen Hearing Screening - Comments:: Has hearing aids that are maiantained Vision Screening - Comments:: Regular eye exams, Looking for new eye doctor   Goals Addressed             This Visit's Progress    Patient Stated       04/10/2024, wants to lose 10 pounds and be more active, eat healthier       Depression Screen     04/10/2024   10:42 AM 04/27/2023    2:10 PM 04/05/2023   10:56 AM 02/27/2023   11:22 AM 09/07/2022    9:45 AM 03/23/2022   12:21 PM 03/17/2021    4:02 PM  PHQ 2/9 Scores  PHQ - 2 Score 0 0 0 0 0 0 0  PHQ- 9 Score 2 5         Fall Risk     04/10/2024   10:42 AM 04/27/2023    2:10 PM 04/05/2023   10:56 AM 02/27/2023   11:22 AM 09/07/2022    9:45 AM  Fall Risk   Falls in the past year? 0 0 0 0 0  Number falls in past yr: 0 0 0  0  Injury with Fall? 0 0 0  0  Risk for fall due to : Medication side effect No Fall Risks No Fall Risks;Medication side effect  No Fall Risks  Follow up Falls prevention discussed;Falls evaluation completed Falls evaluation completed Falls prevention discussed;Education provided;Falls evaluation completed  Falls evaluation completed    MEDICARE RISK AT HOME:  Medicare Risk at Home Any stairs in or around the home?: No If so, are there any without handrails?: No Home free of loose throw rugs in walkways, pet beds, electrical cords, etc?: Yes Adequate  lighting in your home to reduce risk of falls?: Yes Life alert?: No Use of a cane, walker or w/c?: No Grab bars in the bathroom?: No Shower chair or bench in shower?: No Elevated toilet seat or a handicapped toilet?: Yes  TIMED UP AND GO:  Was the test performed?  Yes  Length of time to ambulate 10 feet: 5 sec Gait steady and fast without use of assistive device  Cognitive Function: 6CIT completed        04/10/2024   10:43 AM 04/05/2023   10:58 AM 03/23/2022   12:23 PM 03/17/2021    4:03 PM 02/12/2020    3:19 PM  6CIT Screen  What Year? 0 points 0 points 0 points 0 points 0 points  What month? 0 points 0 points 0 points 0 points 0 points  What time? 0 points 0 points 0 points 0 points 0 points  Count back from 20 0 points 0 points 0 points 0 points 0 points  Months in reverse 0 points 0 points 0 points 0 points 0 points  Repeat phrase 0 points 0 points 0 points 4 points 0 points  Total Score 0 points 0 points 0 points 4 points 0 points    Immunizations Immunization History  Administered Date(s) Administered   Fluad Quad(high Dose 65+) 08/17/2020, 08/23/2021, 09/07/2022   Influenza, High Dose Seasonal PF 08/18/2018, 07/24/2019, 08/16/2023   Moderna Covid-19 Fall Seasonal Vaccine 55yrs & older 08/16/2023   PFIZER Comirnaty(Gray Top)Covid-19 Tri-Sucrose Vaccine 03/11/2021   PFIZER(Purple Top)SARS-COV-2 Vaccination 01/12/2020, 02/04/2020, 09/03/2020   PNEUMOCOCCAL CONJUGATE-20 02/22/2022   Pfizer Covid-19 Vaccine Bivalent Booster 3yrs & up 08/12/2021, 09/24/2022   Pfizer(Comirnaty)Fall Seasonal Vaccine 12 years and older  03/04/2024   Pneumococcal Polysaccharide-23 12/21/2016   Respiratory Syncytial Virus Vaccine,Recomb Aduvanted(Arexvy) 12/14/2022   Tdap 10/30/2012, 01/11/2023   Zoster Recombinant(Shingrix ) 10/27/2021, 01/25/2022    Screening Tests Health Maintenance  Topic Date Due   COVID-19 Vaccine (9 - 2024-25 season) 04/29/2024   INFLUENZA VACCINE  06/28/2024    MAMMOGRAM  01/29/2025   Medicare Annual Wellness (AWV)  04/10/2025   Colonoscopy  05/03/2032   DTaP/Tdap/Td (3 - Td or Tdap) 01/11/2033   Pneumonia Vaccine 61+ Years old  Completed   DEXA SCAN  Completed   Hepatitis C Screening  Completed   Zoster Vaccines- Shingrix   Completed   HPV VACCINES  Aged Out   Meningococcal B Vaccine  Aged Out    Health Maintenance  There are no preventive care reminders to display for this patient.  Health Maintenance Items Addressed: Up to date  Additional Screening:  Vision Screening: Recommended annual ophthalmology exams for early detection of glaucoma and other disorders of the eye.  Dental Screening: Recommended annual dental exams for proper oral hygiene  Community Resource Referral / Chronic Care Management: CRR required this visit?  No   CCM required this visit?  No   Plan:    I have personally reviewed and noted the following in the patient's chart:   Medical and social history Use of alcohol, tobacco or illicit drugs  Current medications and supplements including opioid prescriptions. Patient is not currently taking opioid prescriptions. Functional ability and status Nutritional status Physical activity Advanced directives List of other physicians Hospitalizations, surgeries, and ER visits in previous 12 months Vitals Screenings to include cognitive, depression, and falls Referrals and appointments  In addition, I have reviewed and discussed with patient certain preventive protocols, quality metrics, and best practice recommendations. A written personalized care plan for preventive services as well as general preventive health recommendations were provided to patient.   Areatha Beecham, LPN   1/61/0960   After Visit Summary: (In Person-Printed) AVS printed and given to the patient  Notes: Nothing significant to report at this time.

## 2024-04-16 ENCOUNTER — Ambulatory Visit: Payer: Self-pay

## 2024-04-16 NOTE — Telephone Encounter (Signed)
  Chief Complaint: Hives Symptoms: Red, itchy welts  Frequency: 2 days Pertinent Negatives: Patient denies throat/tongue swelling, difficulty breathing, fever Disposition: [] ED /[] Urgent Care (no appt availability in office) / [x] Appointment(In office/virtual)/ []  [facility]/ [] Home Care/ [] Refused Recommended Disposition /[] [facility]/ []  Follow-up with PCP Additional Notes: Red, itchy welts the size of a pencil eraser present for 2 days. No known cause - no change in household items, food, exposure to new plants/animals. Patient denies throat swelling, difficulty breathing. Patient educated for home care until appt tomorrow.    Copied from CRM [id number]. Topic: Clinical - Red Word Triage >> Apr 16, 2024  9:08 AM Sanjuana Crutch wrote: Red Word that prompted transfer to Nurse Triage: breaking out in hives, might be an allergic reaction. Started a couple days ago and wants to see a doctor. Reason for Disposition  [1] MODERATE-SEVERE hives persist (i.e., hives interfere with normal activities or work) AND [2] taking antihistamine (e.g., Benadryl, Claritin) > 24 hours  Answer Assessment - Initial Assessment Questions 1. APPEARANCE: "What does the rash look like?"      Red welts/bumps 2. LOCATION: "Where is the rash located?"      Upper chest, arms, thighs, back 3. NUMBER: "How many hives are there?"      'too many to count'  4. SIZE: "How big are the hives?" (inches, cm, compare to coins) "Do they all look the same or is there lots of variation in shape and size?"      Pencil head eraser 5. ONSET: "When did the hives begin?" (Hours or days ago)      2-3 days 6. ITCHING: "Does it itch?" If Yes, ask: "How bad is the itch?"    - MILD: doesn't interfere with normal activities   - MODERATE-SEVERE: interferes with work, school, sleep, or other activities      6 7. RECURRENT PROBLEM: "Have you had hives before?" If Yes, ask: "When was the last time?" and "What happened that  time?"      Had something like this last summer, uncertain of cause 8. TRIGGERS: "Were you exposed to any new food, plant, cosmetic product or animal just before the hives began?"     No 9. OTHER SYMPTOMS: "Do you have any other symptoms?" (e.g., fever, tongue swelling, difficulty breathing, abdomen pain)     No  Protocols used: Hives-A-AH

## 2024-04-16 NOTE — Progress Notes (Unsigned)
 [place], CMA,acting as a Neurosurgeon for Susanna Epley, FNP.,have documented all relevant documentation on the behalf of Susanna Epley, FNP,as directed by  Susanna Epley, FNP while in the presence of Susanna Epley, FNP.  Subjective:  Patient ID: [name] , female    DOB: Feb 28, 1951 , 73 y.o.   MRN: [id number]  Chief Complaint  Patient presents with  . Urticaria    Patient presents today for hives that started Sunday, she reports it started with her neck and it has now spread. Patient reports she hasn't had any changes. She did take OTC medicine for her hives.     HPI  She has red rash to right chest, right arm, back of her leg (which looks like it may burst) she has been taking benadryl cream and allegra. No other family members have a rash. She has itching. She has not seen any insects of concern. Denies fever/shortness of breath. She has had the shingles vaccine. She feels like the rash is worse. She has not been outside in the yard. Denies food allergies. She did try lamb for the first time last week on Thursday and on Sunday or Monday had the rash. She has used a different detergent "[employer]" recently but has switched back to gain.     Past Medical History:  Diagnosis Date  . Arthritis   . Constipation   . GERD (gastroesophageal reflux disease)   . H/O breast biopsy   . Hypertension      Family History  Problem Relation Age of Onset  . Diabetes Mother   . Heart attack Father   . Cancer Brother   . Anesthesia problems Neg Hx      Current Outpatient Medications:  .  aspirin 81 MG tablet, Take 81 mg by mouth daily., Disp: , Rfl:  .  cetirizine  (ZYRTEC  ALLERGY) 10 MG tablet, Take 1 tablet (10 mg total) by mouth daily for 20 days., Disp: 20 tablet, Rfl: 0 .  famotidine (PEPCID) 40 MG tablet, Take 40 mg by mouth 2 (two) times daily., Disp: , Rfl:  .  lisinopril -hydrochlorothiazide  (ZESTORETIC ) 20-12.5 MG tablet, Take 1 tablet by mouth daily., Disp: 90 tablet, Rfl: 1 .   methocarbamol  (ROBAXIN ) 500 MG tablet, Take 1 tablet (500 mg total) by mouth every 8 (eight) hours as needed for muscle spasms., Disp: 20 tablet, Rfl: 0 .  pantoprazole  (PROTONIX ) 40 MG tablet, Take 40 mg by mouth every morning., Disp: , Rfl:  .  simvastatin  (ZOCOR ) 40 MG tablet, Take 1 tablet (40 mg total) by mouth daily., Disp: 90 tablet, Rfl: 1   Allergies  Allergen Reactions  . Penicillins Hives     Review of Systems   Today's Vitals   04/17/24 1507  BP: 128/72  Pulse: 78  Temp: 98.6 F (37 C)  TempSrc: Oral  Weight: 200 lb 12.8 oz (91.1 kg)  Height: 5\' 7"  (1.702 m)  PainSc: 0-No pain   Body mass index is 31.45 kg/m.  Wt Readings from Last 3 Encounters:  04/17/24 200 lb 12.8 oz (91.1 kg)  04/10/24 199 lb 3.2 oz (90.4 kg)  03/04/24 198 lb 3.2 oz (89.9 kg)    The 10-year ASCVD risk score (Arnett DK, et al., 2019) is: 12.8%   Values used to calculate the score:     Age: 49 years     Sex: Female     Is Non-Hispanic African American: Yes     Diabetic: No     Tobacco smoker:  No     Systolic Blood Pressure: 128 mmHg     Is BP treated: Yes     HDL Cholesterol: 52 mg/dL     Total Cholesterol: 185 mg/dL  Objective:  Physical Exam Vitals and nursing note reviewed.  Constitutional:      General: She is not in acute distress.    Appearance: Normal appearance. She is obese.  Cardiovascular:     Rate and Rhythm: Normal rate.     Pulses: Normal pulses.     Heart sounds: Normal heart sounds. No murmur heard. Pulmonary:     Effort: Pulmonary effort is normal. No respiratory distress.     Breath sounds: Normal breath sounds. No wheezing.  Skin:    Capillary Refill: Capillary refill takes less than 2 seconds.     Findings: Rash (various areas righg chest, posterior arms) present.  Neurological:     General: No focal deficit present.     Mental Status: She is alert and oriented to person, place, and time.     Cranial Nerves: No cranial nerve deficit.     Motor: No  weakness.        Assessment And Plan:  Hives  Allergic reaction, initial encounter  Class 1 obesity due to excess calories with body mass index (BMI) of 31.0 to 31.9 in adult, unspecified whether serious comorbidity present    Return for keep same next.  Patient was given opportunity to ask questions. Patient verbalized understanding of the plan and was able to repeat key elements of the plan. All questions were answered to their satisfaction.    Inge Mangle, FNP, have reviewed all documentation for this visit. The documentation on 04/17/24 for the exam, diagnosis, procedures, and orders are all accurate and complete.   IF YOU HAVE BEEN REFERRED TO A SPECIALIST, IT MAY TAKE 1-2 WEEKS TO SCHEDULE/PROCESS THE REFERRAL. IF YOU HAVE NOT HEARD FROM US /SPECIALIST IN TWO WEEKS, PLEASE GIVE US  A CALL AT [phone].

## 2024-04-17 ENCOUNTER — Encounter: Payer: Self-pay | Admitting: Nurse Practitioner

## 2024-04-17 ENCOUNTER — Ambulatory Visit: Admitting: Nurse Practitioner

## 2024-04-17 VITALS — BP 128/72 | HR 78 | Temp 98.6°F | Ht 67.0 in | Wt 200.8 lb

## 2024-04-17 DIAGNOSIS — E6609 Other obesity due to excess calories: Secondary | ICD-10-CM | POA: Diagnosis not present

## 2024-04-17 DIAGNOSIS — E66811 Obesity, class 1: Secondary | ICD-10-CM

## 2024-04-17 DIAGNOSIS — T7840XA Allergy, unspecified, initial encounter: Secondary | ICD-10-CM | POA: Diagnosis not present

## 2024-04-17 DIAGNOSIS — Z6831 Body mass index (BMI) 31.0-31.9, adult: Secondary | ICD-10-CM

## 2024-04-17 DIAGNOSIS — R21 Rash and other nonspecific skin eruption: Secondary | ICD-10-CM | POA: Diagnosis not present

## 2024-04-17 MED ORDER — TRIAMCINOLONE ACETONIDE 40 MG/ML IJ SUSP: 40.0000 mg | Freq: Once | INTRAMUSCULAR | Status: AC

## 2024-04-19 ENCOUNTER — Ambulatory Visit
Admission: EM | Admit: 2024-04-19 | Discharge: 2024-04-19 | Disposition: A | Discharge status: Home / Self Care | Attending: Family Medicine | Admitting: Family Medicine

## 2024-04-19 DIAGNOSIS — L309 Dermatitis, unspecified: Secondary | ICD-10-CM | POA: Diagnosis not present

## 2024-04-19 MED ORDER — PREDNISONE 20 MG PO TABS
ORAL_TABLET | ORAL | 0 refills | Status: DC
Start: 2024-04-19 — End: 2024-06-25

## 2024-04-19 MED ORDER — DOXYCYCLINE HYCLATE 100 MG PO CAPS: 100.0000 mg | ORAL_CAPSULE | Freq: Two times a day (BID) | ORAL | 0 refills | Status: AC

## 2024-04-19 NOTE — Discharge Instructions (Addendum)
 Take prednisone  20 mg--3 tabs daily x3 days, then 2 tabs daily x3 days, then 1 tab daily x3 days, then one half tab daily x3 days, then stop  Take doxycycline  100 mg --1 capsule 2 times daily for 7 days  Continue the Allegra as needed.  Please follow-up with your primary care

## 2024-04-19 NOTE — ED Triage Notes (Signed)
"  I am breaking out in a rash/bumps that started Sunday around neck and spreading now". "I saw my PCP Wednesday, she didn't know what it was, she said it wasn't shingles".

## 2024-04-19 NOTE — ED Provider Notes (Signed)
 [facility]    CSN: [id number] Arrival date & time: 04/19/24  1014      History   Chief Complaint Chief Complaint  Patient presents with   Rash    HPI [name] is a 73 y.o. female.    Rash Here for pruritic rash on her neck and chest and legs.  If for started breaking out on May 18, and then it worsened.  Not really any burning or tingling pain.  She saw her primary care on May 21 and was given a triamcinolone  injection.  She does not feel that she improved much and in fact there is more rash on her legs now.  There is 1 spot that she thinks is about to pop.  She is allergic to penicillins.  No history of diabetes  Past Medical History:  Diagnosis Date   Arthritis    Constipation    GERD (gastroesophageal reflux disease)    H/O breast biopsy    Hypertension     Patient Active Problem List   Diagnosis Date Noted   Encounter for annual health examination 03/12/2024   Leukocytes in urine 03/12/2024   Does use hearing aid 03/04/2024   Bilateral hearing loss 03/04/2024   Chronic bilateral thoracic back pain 03/04/2024   COVID-19 vaccine administered 03/04/2024   Class 1 obesity due to excess calories with body mass index (BMI) of 31.0 to 31.9 in adult 04/29/2023   Decreased hearing of both ears 04/19/2023   Chronic bilateral low back pain with bilateral sciatica 04/05/2023   Belching 01/10/2023   Adjustment insomnia 08/17/2020   Grief 08/17/2020   Mixed hyperlipidemia 07/24/2019   Prediabetes 07/24/2019   Gastroesophageal reflux disease without esophagitis 10/15/2018   Essential hypertension 10/15/2018    Past Surgical History:  Procedure Laterality Date   ABDOMINAL HYSTERECTOMY     BREAST BIOPSY Left 01/2013   BREAST SURGERY     bio  neg cancer   EYE SURGERY  2019   bilateral cataract surgery    OB History   No obstetric history on file.      Home Medications    Prior to Admission medications   Medication Sig Start Date End  Date Taking? Authorizing Provider  aspirin 81 MG tablet Take 81 mg by mouth daily.   Yes [provider]  diphenhydrAMINE (SOMINEX) 25 MG tablet Take 25 mg by mouth at bedtime as needed for sleep.   Yes [provider]  doxycycline  (VIBRAMYCIN ) 100 MG capsule Take 1 capsule (100 mg total) by mouth 2 (two) times daily for 7 days. 04/19/24 04/26/24 Yes Hendrik Donath K, MD  famotidine (PEPCID) 40 MG tablet Take 40 mg by mouth 2 (two) times daily. 01/04/24  Yes [provider]  hydrocortisone cream 1 % Apply 1 Application topically 2 (two) times daily.   Yes [provider]  lisinopril -hydrochlorothiazide  (ZESTORETIC ) 20-12.5 MG tablet Take 1 tablet by mouth daily. 02/23/24  Yes Susanna Epley, FNP  MAGNESIUM PO Magnesium   Yes [provider]  Multiple Vitamin (MULTIVITAMIN ADULT PO) MULTIVITAMIN   Yes [provider]  pantoprazole  (PROTONIX ) 40 MG tablet Take 40 mg by mouth every morning. 01/01/23  Yes [provider]  predniSONE  (DELTASONE ) 20 MG tablet 3 tabs daily x3 days, then 2 tabs daily x3 days, then 1 tab daily x3 days, then one half tab daily x3 days, then stop 04/19/24  Yes Bryne Lindon K, MD  simvastatin  (ZOCOR ) 40 MG tablet Take 1 tablet (40  mg total) by mouth daily. 02/23/24  Yes Susanna Epley, FNP  cetirizine  (ZYRTEC  ALLERGY) 10 MG tablet Take 1 tablet (10 mg total) by mouth daily for 20 days. 04/27/23 04/17/24  Melodie Spry, NP  methocarbamol  (ROBAXIN ) 500 MG tablet Take 1 tablet (500 mg total) by mouth every 8 (eight) hours as needed for muscle spasms. 03/04/24   Susanna Epley, FNP    Family History Family History  Problem Relation Age of Onset   Diabetes Mother    Heart attack Father    Cancer Brother    Anesthesia problems Neg Hx     Social History Social History   Tobacco Use   Smoking status: Never   Smokeless tobacco: Never  Vaping Use   Vaping status: Never Used  Substance Use Topics   Alcohol use: No    Drug use: No     Allergies   Penicillins   Review of Systems Review of Systems  Skin:  Positive for rash.     Physical Exam Triage Vital Signs ED Triage Vitals  Encounter Vitals Group     BP 04/19/24 1037 122/68     Systolic BP Percentile --      Diastolic BP Percentile --      Pulse Rate 04/19/24 1037 73     Resp 04/19/24 1037 18     Temp 04/19/24 1037 98 F (36.7 C)     Temp Source 04/19/24 1037 Oral     SpO2 04/19/24 1037 97 %     Weight 04/19/24 1033 200 lb (90.7 kg)     Height 04/19/24 1033 5\' 7"  (1.702 m)     Head Circumference --      Peak Flow --      Pain Score 04/19/24 1031 0     Pain Loc --      Pain Education --      Exclude from Growth Chart --    No data found.  Updated Vital Signs BP 122/68 (BP Location: Left Arm)   Pulse 73   Temp 98 F (36.7 C) (Oral)   Resp 18   Ht 5\' 7"  (1.702 m)   Wt 90.7 kg   SpO2 97%   BMI 31.32 kg/m   Visual Acuity Right Eye Distance:   Left Eye Distance:   Bilateral Distance:    Right Eye Near:   Left Eye Near:    Bilateral Near:     Physical Exam Vitals reviewed.  Constitutional:      General: She is not in acute distress.    Appearance: She is not ill-appearing, toxic-appearing or diaphoretic.  HENT:     Nose: Nose normal.     Mouth/Throat:     Mouth: Mucous membranes are moist.     Pharynx: No oropharyngeal exudate or posterior oropharyngeal erythema.  Eyes:     Extraocular Movements: Extraocular movements intact.     Conjunctiva/sclera: Conjunctivae normal.     Pupils: Pupils are equal, round, and reactive to light.  Cardiovascular:     Rate and Rhythm: Normal rate and regular rhythm.     Heart sounds: No murmur heard. Pulmonary:     Effort: Pulmonary effort is normal. No respiratory distress.     Breath sounds: Normal breath sounds. No stridor. No wheezing, rhonchi or rales.  Musculoskeletal:     Cervical back: Neck supple.  Lymphadenopathy:     Cervical: No cervical adenopathy.  Skin:     Coloration: Skin is not jaundiced or pale.  Comments: There are slightly raised erythematous bumps on her right and left neck and anterior chest.  Also on her legs she has some similar bumps.  On her left medial thigh there is a blister that is fluid-filled about 0.5 cm in diameter.  Neurological:     General: No focal deficit present.     Mental Status: She is alert and oriented to person, place, and time.  Psychiatric:        Behavior: Behavior normal.      UC Treatments / Results  Labs (all labs ordered are listed, but only abnormal results are displayed) Labs Reviewed - No data to display  EKG   Radiology No results found.  Procedures Procedures (including critical care time)  Medications Ordered in UC Medications - No data to display  Initial Impression / Assessment and Plan / UC Course  I have reviewed the triage vital signs and the nursing notes.  Pertinent labs & imaging results that were available during my care of the patient were reviewed by me and considered in my medical decision making (see chart for details).     Prednisone  taper is sent to the pharmacy, and in case this is a folliculitis doxycycline  is sent in to treat.  She is given contact information for dermatology and allergy, and have asked her to follow-up with her primary care. Final Clinical Impressions(s) / UC Diagnoses   Final diagnoses:  Dermatitis     Discharge Instructions      Take prednisone  20 mg--3 tabs daily x3 days, then 2 tabs daily x3 days, then 1 tab daily x3 days, then one half tab daily x3 days, then stop  Take doxycycline  100 mg --1 capsule 2 times daily for 7 days  Continue the Allegra as needed.  Please follow-up with your primary care     ED Prescriptions     Medication Sig Dispense Auth. Provider   predniSONE  (DELTASONE ) 20 MG tablet 3 tabs daily x3 days, then 2 tabs daily x3 days, then 1 tab daily x3 days, then one half tab daily x3 days, then stop 20 tablet  Teri Diltz K, MD   doxycycline  (VIBRAMYCIN ) 100 MG capsule Take 1 capsule (100 mg total) by mouth 2 (two) times daily for 7 days. 14 capsule Ellsworth Haas, Kayleena Eke K, MD      PDMP not reviewed this encounter.   Ann Keto, MD 04/19/24 [id number]

## 2024-04-26 DIAGNOSIS — R21 Rash and other nonspecific skin eruption: Secondary | ICD-10-CM | POA: Insufficient documentation

## 2024-04-26 DIAGNOSIS — T7840XA Allergy, unspecified, initial encounter: Secondary | ICD-10-CM | POA: Insufficient documentation

## 2024-04-26 NOTE — Assessment & Plan Note (Addendum)
 Unsure if allergic reaction however her rash is erythematous and hive like. May be related to detergent.

## 2024-04-26 NOTE — Assessment & Plan Note (Signed)
 She has erythematous hive type rash to her arms and chest, will treat with steroid injection. If not better return call to office.

## 2024-04-26 NOTE — Assessment & Plan Note (Signed)
 She is encouraged to strive for BMI less than 30 to decrease cardiac risk. Advised to aim for at least 150 minutes of exercise per week.

## 2024-05-01 DIAGNOSIS — M5125 Other intervertebral disc displacement, thoracolumbar region: Secondary | ICD-10-CM | POA: Diagnosis not present

## 2024-05-01 DIAGNOSIS — M48061 Spinal stenosis, lumbar region without neurogenic claudication: Secondary | ICD-10-CM | POA: Diagnosis not present

## 2024-05-01 DIAGNOSIS — M9904 Segmental and somatic dysfunction of sacral region: Secondary | ICD-10-CM | POA: Diagnosis not present

## 2024-05-01 DIAGNOSIS — S138XXA Sprain of joints and ligaments of other parts of neck, initial encounter: Secondary | ICD-10-CM | POA: Diagnosis not present

## 2024-05-01 DIAGNOSIS — M9902 Segmental and somatic dysfunction of thoracic region: Secondary | ICD-10-CM | POA: Diagnosis not present

## 2024-05-01 DIAGNOSIS — M9901 Segmental and somatic dysfunction of cervical region: Secondary | ICD-10-CM | POA: Diagnosis not present

## 2024-05-01 DIAGNOSIS — M9903 Segmental and somatic dysfunction of lumbar region: Secondary | ICD-10-CM | POA: Diagnosis not present

## 2024-05-09 ENCOUNTER — Other Ambulatory Visit: Payer: Self-pay | Admitting: Nurse Practitioner

## 2024-05-29 DIAGNOSIS — M48061 Spinal stenosis, lumbar region without neurogenic claudication: Secondary | ICD-10-CM | POA: Diagnosis not present

## 2024-05-29 DIAGNOSIS — M9904 Segmental and somatic dysfunction of sacral region: Secondary | ICD-10-CM | POA: Diagnosis not present

## 2024-05-29 DIAGNOSIS — M9901 Segmental and somatic dysfunction of cervical region: Secondary | ICD-10-CM | POA: Diagnosis not present

## 2024-05-29 DIAGNOSIS — M9903 Segmental and somatic dysfunction of lumbar region: Secondary | ICD-10-CM | POA: Diagnosis not present

## 2024-05-29 DIAGNOSIS — M5125 Other intervertebral disc displacement, thoracolumbar region: Secondary | ICD-10-CM | POA: Diagnosis not present

## 2024-05-29 DIAGNOSIS — S138XXA Sprain of joints and ligaments of other parts of neck, initial encounter: Secondary | ICD-10-CM | POA: Diagnosis not present

## 2024-05-29 DIAGNOSIS — M9902 Segmental and somatic dysfunction of thoracic region: Secondary | ICD-10-CM | POA: Diagnosis not present

## 2024-06-25 ENCOUNTER — Ambulatory Visit: Admitting: Allergy & Immunology

## 2024-06-25 ENCOUNTER — Encounter: Payer: Self-pay | Admitting: Allergy & Immunology

## 2024-06-25 ENCOUNTER — Other Ambulatory Visit: Payer: Self-pay

## 2024-06-25 VITALS — BP 120/78 | HR 96 | Temp 97.3°F | Resp 16 | Ht 65.95 in | Wt 196.3 lb

## 2024-06-25 DIAGNOSIS — K219 Gastro-esophageal reflux disease without esophagitis: Secondary | ICD-10-CM | POA: Diagnosis not present

## 2024-06-25 DIAGNOSIS — L508 Other urticaria: Secondary | ICD-10-CM | POA: Diagnosis not present

## 2024-06-25 NOTE — Patient Instructions (Addendum)
 1. Chronic urticaria - Your history does not have any red flags such as fevers, joint pains, or permanent skin changes that would be concerning for a more serious cause of hives.  - We will get some labs to rule out serious causes of hives: complete blood count, tryptase level, chronic urticaria panel, CMP, ESR, and CRP. - Chronic hives are often times a self limited process and will burn themselves out over 6-12 months, although this is not always the case.  - In the meantime, start suppressive dosing of antihistamines at the first sign of hives/itching  - Morning: Zyrtec  (cetirizine ) 20mg  (one tablet) + Pepcid (famotidine) 40mg   - Evening: Zyrtec  (cetirizine ) 20mg  (one tablet) + Pepcid (famotidine) 40mg  - You can change this dosing at home, decreasing the dose as needed or increasing the dosing as needed.  - We can always add Xolair to PREVENT outbreaks, but that seems like it would be a bit much if you are only having breakouts every year.   2. Contact dermatitis  - Consider patch testing to look for chemical sensitivities. - This might be explain the peeling on your feet.   3. GERD - Continue with the pantoprazole  40mg  daily. - Continue with famotidine 40mg  twice daily as needed on particularly bad days.    4. Return in about 6 months (around 12/26/2024). You can have the follow up appointment with Dr. Iva or a Nurse Practicioner (our Nurse Practitioners are excellent and always have Physician oversight!).    Please inform us  of any Emergency Department visits, hospitalizations, or changes in symptoms. Call us  before going to the ED for breathing or allergy symptoms since we might be able to fit you in for a sick visit. Feel free to contact us  anytime with any questions, problems, or concerns.  It was a pleasure to meet you today!  Websites that have reliable patient information: 1. American Academy of Asthma, Allergy, and Immunology: www.aaaai.org 2. Food Allergy Research and  Education (FARE): foodallergy.org 3. Mothers of Asthmatics: http://www.asthmacommunitynetwork.org 4. American College of Allergy, Asthma, and Immunology: www.acaai.org      "Like" us  on Facebook and Instagram for our latest updates!      A healthy democracy works best when [employer] participate! Make sure you are registered to vote! If you have moved or changed any of your contact information, you will need to get this updated before voting! Scan the QR codes below to learn more!

## 2024-06-25 NOTE — Progress Notes (Unsigned)
 NEW PATIENT  Date of Service/Encounter:  06/25/24  Consult requested by: Briana Speaks, FNP   Assessment:   Chronic urticaria  Gastroesophageal reflux disease  Plan/Recommendations:   1. Chronic urticaria - Your history does not have any red flags such as fevers, joint pains, or permanent skin changes that would be concerning for a more serious cause of hives.  - We will get some labs to rule out serious causes of hives: complete blood count, tryptase level, chronic urticaria panel, CMP, ESR, and CRP. - Chronic hives are often times a self limited process and will burn themselves out over 6-12 months, although this is not always the case.  - In the meantime, start suppressive dosing of antihistamines at the first sign of hives/itching  - Morning: Zyrtec  (cetirizine ) 20mg  (one tablet) + Pepcid (famotidine) 40mg   - Evening: Zyrtec  (cetirizine ) 20mg  (one tablet) + Pepcid (famotidine) 40mg  - You can change this dosing at home, decreasing the dose as needed or increasing the dosing as needed.  - We can always add Xolair to PREVENT outbreaks, but that seems like it would be a bit much if you are only having breakouts every year.   2. Contact dermatitis  - Consider patch testing to look for chemical sensitivities. - This might be explain the peeling on your feet.   3. GERD - Continue with the pantoprazole  40mg  daily. - Continue with famotidine 40mg  twice daily as needed on particularly bad days.    4. Return in about 6 months (around 12/26/2024). You can have the follow up appointment with Dr. Iva or a Nurse Practicioner (our Nurse Practitioners are excellent and always have Physician oversight!).    This note in its entirety was forwarded to the Provider who requested this consultation.  Subjective:   [name] is a 73 y.o. female presenting today for evaluation of  Chief Complaint  Patient presents with   Establish Care    She states she has rash and hives  breakout most upper body since in May     [name] has a history of the following: Patient Active Problem List   Diagnosis Date Noted   Rash and nonspecific skin eruption 04/26/2024   Allergic reaction 04/26/2024   Encounter for annual health examination 03/12/2024   Leukocytes in urine 03/12/2024   Does use hearing aid 03/04/2024   Bilateral hearing loss 03/04/2024   Chronic bilateral thoracic back pain 03/04/2024   COVID-19 vaccine administered 03/04/2024   Class 1 obesity due to excess calories with body mass index (BMI) of 31.0 to 31.9 in adult 04/29/2023   Decreased hearing of both ears 04/19/2023   Chronic bilateral low back pain with bilateral sciatica 04/05/2023   Belching 01/10/2023   Adjustment insomnia 08/17/2020   Grief 08/17/2020   Mixed hyperlipidemia 07/24/2019   Prediabetes 07/24/2019   Gastroesophageal reflux disease without esophagitis 10/15/2018   Essential hypertension 10/15/2018    History obtained from: chart review and patient.  Discussed the use of AI scribe software for clinical note transcription with the patient and/or guardian, who gave verbal consent to proceed.  [name] was referred by Briana Speaks, FNP.     [name] is a 73 y.o. female presenting for a follow up visit.  She has been experiencing recurrent rashes and hives, with the most recent episode beginning in May. The rash was widespread, affecting most of her upper body, including her back and axillae, and was intensely pruritic. The rash left dark spots on  her skin after it resolved. A similar episode occurred almost exactly a year ago.  In May, she sought care at urgent care due to the worsening of the rash and received a steroid injection, a six-day course of oral steroids, an antibiotic, and a topical cream, which helped clear the rash. She also used Zyrtec  at night to manage the pruritus, as it caused sedation. She has not undergone any allergy testing previously and has no history  of asthma or environmental allergies.  She describes additional symptoms of desquamation on her feet, which began around the same time as the rash in May. The skin on her feet turned black and is now peeling off despite using emollients. She also experiences irritation from certain analgesic creams and patches used on her back.  She has a history of gastroesophageal reflux disease and takes pantoprazole  regularly, with occasional use of famotidine. She has not noticed any dietary changes that could have triggered the rash, although she consumes a lot of fruit, which she acknowledges is acidic.  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 She lives in [place] and engages in activities such as chair yoga at the [facility]         Asthma/Respiratory Symptom History: ***  Allergic Rhinitis Symptom History: ***  Food Allergy Symptom History: ***  Skin Symptom History: ***  GERD Symptom History: ***  Infection Symptom History: ***  ***Otherwise, there is no history of other atopic diseases, including {Blank multiple:19196:o:asthma,food allergies,drug allergies,environmental allergies,stinging insect allergies,eczema,urticaria,contact dermatitis}. There is no significant infectious history. ***Vaccinations are up to date.    Past Medical History: Patient Active Problem List   Diagnosis Date Noted   Rash and nonspecific skin eruption 04/26/2024   Allergic reaction 04/26/2024   Encounter for annual health examination 03/12/2024   Leukocytes in urine 03/12/2024   Does use hearing aid 03/04/2024   Bilateral hearing loss 03/04/2024   Chronic bilateral thoracic back pain 03/04/2024   COVID-19 vaccine administered 03/04/2024   Class 1 obesity due to excess calories with body mass index (BMI) of 31.0 to 31.9 in adult 04/29/2023   Decreased hearing of both ears 04/19/2023   Chronic bilateral low back pain with  bilateral sciatica 04/05/2023   Belching 01/10/2023   Adjustment insomnia 08/17/2020   Grief 08/17/2020   Mixed hyperlipidemia 07/24/2019   Prediabetes 07/24/2019   Gastroesophageal reflux disease without esophagitis 10/15/2018   Essential hypertension 10/15/2018    Medication List:  Allergies as of 06/25/2024       Reactions   Penicillins Hives, Other (See Comments)        Medication List        Accurate as of June 25, 2024 11:59 PM. If you have any questions, ask your nurse or doctor.          STOP taking these medications    cetirizine  10 MG tablet Commonly known as: ZyrTEC  Allergy Stopped by: Marty Morton Shaggy   diphenhydrAMINE 25 MG tablet Commonly known as: SOMINEX Stopped by: Burl Tauzin Louis Mayson Mcneish   hydrocortisone cream 1 % Stopped by: Ashlyn Cabler Louis Aanyah Loa   methocarbamol  500 MG tablet Commonly known as: ROBAXIN  Stopped by: Marty Morton Shaggy   predniSONE  20 MG tablet Commonly known as: DELTASONE  Stopped by: Marty Morton Shaggy       TAKE these medications    aspirin 81 MG tablet Take 81 mg by mouth daily.   famotidine 40 MG tablet Commonly known as: PEPCID  Take 40 mg by mouth 2 (two) times daily.   lisinopril -hydrochlorothiazide  20-12.5 MG tablet Commonly known as: ZESTORETIC  Take 1 tablet by mouth daily.   MAGNESIUM PO Magnesium   MULTIVITAMIN ADULT PO MULTIVITAMIN   pantoprazole  40 MG tablet Commonly known as: PROTONIX  Take 40 mg by mouth every morning.   simvastatin  40 MG tablet Commonly known as: ZOCOR  Take 1 tablet (40 mg total) by mouth daily.        Birth History: {Blank single:19197::non-contributory,born premature and spent time in the NICU,born at term without complications}  Developmental History: Aldora has met all milestones on time. She has required no {Blank multiple:19196:a:speech therapy,occupational therapy,physical therapy}. ***non-contributory  Past Surgical History: Past Surgical  History:  Procedure Laterality Date   ABDOMINAL HYSTERECTOMY     BREAST BIOPSY Left 01/2013   BREAST SURGERY     bio  neg cancer   EYE SURGERY  2019   bilateral cataract surgery     Family History: Family History  Problem Relation Age of Onset   Diabetes Mother    Heart attack Father    Cancer Brother    Anesthesia problems Neg Hx      Social History: Aliviya lives at home with ***.  She lives in a house that is 18 all.  There is limited flooring throughout the home as well as the bedroom.  They have gas heating and central cooling.  There are no animals inside or outside of the home.  There are no dust mite covers on the bedding.  There is no tobacco exposure.  She is currently retired.  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  Prior to that, she worked with [employer] for 30 years.   Review of systems otherwise negative other than that mentioned in the HPI.    Objective:   Blood pressure 120/78, pulse 96, temperature (!) 97.3 F (36.3 C), temperature source Temporal, resp. rate 16, height 5' 5.95 (1.675 m), weight 196 lb 4.8 oz (89 kg), SpO2 98%. Body mass index is 31.74 kg/m.     Physical Exam   Diagnostic studies: {Blank single:19197::none,deferred due to recent antihistamine use,deferred due to insurance stipulations that require a separate visit for testing,labs sent instead, }  Spirometry: {Blank single:19197::results normal (FEV1: ***%, FVC: ***%, FEV1/FVC: ***%),results abnormal (FEV1: ***%, FVC: ***%, FEV1/FVC: ***%)}.    {Blank single:19197::Spirometry consistent with mild obstructive disease,Spirometry consistent with moderate obstructive disease,Spirometry consistent with severe obstructive disease,Spirometry consistent with possible restrictive disease,Spirometry consistent with mixed obstructive and restrictive disease,Spirometry uninterpretable due to technique,Spirometry consistent with normal pattern}. {Blank  single:19197::Albuterol/Atrovent nebulizer,Xopenex/Atrovent nebulizer,Albuterol nebulizer,Albuterol four puffs via MDI,Xopenex four puffs via MDI} treatment given in clinic with {Blank single:19197::significant improvement in FEV1 per ATS criteria,significant improvement in FVC per ATS criteria,significant improvement in FEV1 and FVC per ATS criteria,improvement in FEV1, but not significant per ATS criteria,improvement in FVC, but not significant per ATS criteria,improvement in FEV1 and FVC, but not significant per ATS criteria,no improvement}.  Allergy Studies: {Blank single:19197::none,deferred due to recent antihistamine use,deferred due to insurance stipulations that require a separate visit for testing,labs sent instead, }    {Blank single:19197::Allergy testing results were read and interpreted by myself, documented by clinical staff., }         Marty Shaggy, MD [facility] 

## 2024-06-26 DIAGNOSIS — M5125 Other intervertebral disc displacement, thoracolumbar region: Secondary | ICD-10-CM | POA: Diagnosis not present

## 2024-06-26 DIAGNOSIS — M9903 Segmental and somatic dysfunction of lumbar region: Secondary | ICD-10-CM | POA: Diagnosis not present

## 2024-06-26 DIAGNOSIS — M9904 Segmental and somatic dysfunction of sacral region: Secondary | ICD-10-CM | POA: Diagnosis not present

## 2024-06-26 DIAGNOSIS — M48061 Spinal stenosis, lumbar region without neurogenic claudication: Secondary | ICD-10-CM | POA: Diagnosis not present

## 2024-06-26 DIAGNOSIS — M9902 Segmental and somatic dysfunction of thoracic region: Secondary | ICD-10-CM | POA: Diagnosis not present

## 2024-06-26 DIAGNOSIS — M9901 Segmental and somatic dysfunction of cervical region: Secondary | ICD-10-CM | POA: Diagnosis not present

## 2024-06-26 DIAGNOSIS — S138XXA Sprain of joints and ligaments of other parts of neck, initial encounter: Secondary | ICD-10-CM | POA: Diagnosis not present

## 2024-06-27 ENCOUNTER — Encounter: Payer: Self-pay | Admitting: Allergy & Immunology

## 2024-07-02 ENCOUNTER — Ambulatory Visit: Payer: Self-pay | Admitting: Allergy & Immunology

## 2024-07-02 DIAGNOSIS — R768 Other specified abnormal immunological findings in serum: Secondary | ICD-10-CM

## 2024-07-04 LAB — CHRONIC URTICARIA (CU) EVAL
ALT: 16 IU/L (ref 0–32)
Basophils Absolute: 0.1 x10E3/uL (ref 0.0–0.2)
Basos: 1 %
CRP: 4 mg/L (ref 0–10)
EOS (ABSOLUTE): 0.2 x10E3/uL (ref 0.0–0.4)
Eos: 2 %
Hematocrit: 38.5 % (ref 34.0–46.6)
Hemoglobin: 12.6 g/dL (ref 11.1–15.9)
Immature Grans (Abs): 0 x10E3/uL (ref 0.0–0.1)
Immature Granulocytes: 0 %
Lymphocytes Absolute: 2.2 x10E3/uL (ref 0.7–3.1)
Lymphs: 32 %
MCH: 31 pg (ref 26.6–33.0)
MCHC: 32.7 g/dL (ref 31.5–35.7)
MCV: 95 fL (ref 79–97)
Monocytes Absolute: 0.5 x10E3/uL (ref 0.1–0.9)
Monocytes: 7 %
Neutrophils Absolute: 4 x10E3/uL (ref 1.4–7.0)
Neutrophils: 58 %
Platelets: 361 x10E3/uL (ref 150–450)
Pooled Donor- BAT CU: 4.53 % (ref 0.00–10.60)
RBC: 4.06 x10E6/uL (ref 3.77–5.28)
RDW: 13.2 % (ref 11.7–15.4)
Sed Rate: 29 mm/h (ref 0–40)
TSH: 0.418 u[IU]/mL — ABNORMAL LOW (ref 0.450–4.500)
Thyroperoxidase Ab SerPl-aCnc: 11 [IU]/mL (ref 0–34)
WBC: 6.9 x10E3/uL (ref 3.4–10.8)

## 2024-07-04 LAB — ALPHA-GAL PANEL
Allergen Lamb IgE: <0.1 kU/L
Beef IgE: <0.1 kU/L
IgE (Immunoglobulin E), Serum: 1403 [IU]/mL — ABNORMAL HIGH (ref 6–495)
O215-IgE Alpha-Gal: <0.1 kU/L
Pork IgE: <0.1 kU/L

## 2024-07-05 DIAGNOSIS — R768 Other specified abnormal immunological findings in serum: Secondary | ICD-10-CM | POA: Diagnosis not present

## 2024-07-06 LAB — THYROID PANEL WITH TSH
Free Thyroxine Index: 2.6 (ref 1.2–4.9)
T3 Uptake Ratio: 28 % (ref 24–39)
T4, Total: 9.4 ug/dL (ref 4.5–12.0)
TSH: 0.683 u[IU]/mL (ref 0.450–4.500)

## 2024-07-24 DIAGNOSIS — M48061 Spinal stenosis, lumbar region without neurogenic claudication: Secondary | ICD-10-CM | POA: Diagnosis not present

## 2024-07-24 DIAGNOSIS — M9901 Segmental and somatic dysfunction of cervical region: Secondary | ICD-10-CM | POA: Diagnosis not present

## 2024-07-24 DIAGNOSIS — M5125 Other intervertebral disc displacement, thoracolumbar region: Secondary | ICD-10-CM | POA: Diagnosis not present

## 2024-07-24 DIAGNOSIS — M9904 Segmental and somatic dysfunction of sacral region: Secondary | ICD-10-CM | POA: Diagnosis not present

## 2024-07-24 DIAGNOSIS — S138XXA Sprain of joints and ligaments of other parts of neck, initial encounter: Secondary | ICD-10-CM | POA: Diagnosis not present

## 2024-07-24 DIAGNOSIS — M9902 Segmental and somatic dysfunction of thoracic region: Secondary | ICD-10-CM | POA: Diagnosis not present

## 2024-07-24 DIAGNOSIS — M9903 Segmental and somatic dysfunction of lumbar region: Secondary | ICD-10-CM | POA: Diagnosis not present

## 2024-08-05 ENCOUNTER — Other Ambulatory Visit: Payer: Self-pay | Admitting: Nurse Practitioner

## 2024-08-05 ENCOUNTER — Ambulatory Visit: Payer: Self-pay

## 2024-08-05 DIAGNOSIS — E782 Mixed hyperlipidemia: Secondary | ICD-10-CM

## 2024-08-05 DIAGNOSIS — I10 Essential (primary) hypertension: Secondary | ICD-10-CM

## 2024-08-05 NOTE — Telephone Encounter (Signed)
 FYI Only or Action Required?: FYI only for provider.  Patient was last seen in primary care on 04/17/2024 by Georgina Speaks, FNP.  Called Nurse Triage reporting Sore Throat.  Symptoms began About 2 weeks ago.  Interventions attempted: OTC medications: Warm Water and salt gargle, Musinex DM, liquid cold and flu and Rest, hydration, or home remedies.  Symptoms are: unchanged.  Triage Disposition: See PCP When Office is Open (Within 3 Days)  Patient/caregiver understands and will follow disposition?: Yes Reason for Disposition  [1] Sore throat is the only symptom AND [2] present > 48 hours  Answer Assessment - Initial Assessment Questions Gargled with warm water and salt. Negative Covid Test. Taken Musinex DM and liquid cold and flu medicine with no relief.   1. ONSET: When did the throat start hurting? (Hours or days ago)      About 2 weeks  2. SEVERITY: How bad is the sore throat? (Scale 1-10; mild, moderate or severe)     Moderate, not going away   3.  VIRAL SYMPTOMS: Are there any symptoms of a cold, such as a runny nose, cough, hoarse voice or red eyes?      No  4. FEVER: Do you have a fever? If Yes, ask: What is your temperature, how was it measured, and when did it start?     No  5. OTHER SYMPTOMS: Do you have any other symptoms? (e.g., difficulty breathing, headache, rash)     No  Protocols used: Sore Throat-A-AH  Copied from CRM #[id number]. Topic: Clinical - Red Word Triage >> Aug 05, 2024  9:33 AM Gustabo D wrote: Pt has had sore throat for 2 weeks and can't get rid of it

## 2024-08-06 ENCOUNTER — Ambulatory Visit: Payer: Self-pay | Admitting: Nurse Practitioner

## 2024-08-06 ENCOUNTER — Encounter: Payer: Self-pay | Admitting: Nurse Practitioner

## 2024-08-06 VITALS — BP 120/74 | HR 76 | Temp 98.0°F | Ht 65.0 in | Wt 198.8 lb

## 2024-08-06 DIAGNOSIS — Z2821 Immunization not carried out because of patient refusal: Secondary | ICD-10-CM

## 2024-08-06 DIAGNOSIS — J3089 Other allergic rhinitis: Secondary | ICD-10-CM | POA: Diagnosis not present

## 2024-08-06 DIAGNOSIS — J029 Acute pharyngitis, unspecified: Secondary | ICD-10-CM

## 2024-08-06 MED ORDER — LORATADINE 10 MG PO TABS: 10.0000 mg | ORAL_TABLET | Freq: Every day | ORAL | 2 refills | Status: DC

## 2024-08-06 MED ORDER — AZITHROMYCIN 250 MG PO TABS: ORAL_TABLET | ORAL | 0 refills | Status: AC

## 2024-08-06 NOTE — Progress Notes (Signed)
 LILLETTE Kristeen JINNY Gladis, CMA,acting as a Neurosurgeon for Briana Ada, FNP.,have documented all relevant documentation on the behalf of Briana Ada, FNP,as directed by  Briana Ada, FNP while in the presence of Briana Ada, FNP.  Subjective:  Patient ID: [name] , female    DOB: 1951/06/17 , 73 y.o.   MRN: [id number]  Chief Complaint  Patient presents with   Sore Throat    Patient presents today for a sore throat that started over 2 weeks ago, she reports it wont go away.      HPI  Discussed the use of AI scribe software for clinical note transcription with the patient, who gave verbal consent to proceed.  History of Present Illness [name] is a 73 year old female who presents with a persistent sore throat lasting over two weeks.  She has been experiencing a sore throat for over two weeks, initially resembling the onset of a cold. Mucinex alleviated other cold symptoms, but the sore throat persisted. No fever, runny nose, congestion, sneezing, or postnasal drainage. No new rashes, headaches, shortness of breath, or chest pain. She does not have difficulty swallowing but does experience ear pain.  She has been using warm salt water gargles and drinking hot teas with honey, which have not relieved the sore throat. She consumes about six cups of water daily. She has not been in contact with anyone who is sick and reports having allergy problems in the past, including an allergy to penicillin. She has not used nasal sprays or allergy medications recently.  She took a COVID test a few days ago, but not when the symptoms first appeared. She is allergic to penicillin.   Past Medical History:  Diagnosis Date   Arthritis    Constipation    GERD (gastroesophageal reflux disease)    H/O breast biopsy    Hypertension    Urticaria      Family History  Problem Relation Age of Onset   Diabetes Mother    Heart attack Father    Cancer Brother    Anesthesia problems Neg Hx      Current  Outpatient Medications:    aspirin 81 MG tablet, Take 81 mg by mouth daily., Disp: , Rfl:    famotidine (PEPCID) 40 MG tablet, Take 40 mg by mouth 2 (two) times daily., Disp: , Rfl:    lisinopril -hydrochlorothiazide  (ZESTORETIC ) 20-12.5 MG tablet, TAKE 1 TABLET EVERY DAY, Disp: 90 tablet, Rfl: 3   loratadine  (CLARITIN ) 10 MG tablet, Take 1 tablet (10 mg total) by mouth daily., Disp: 30 tablet, Rfl: 2   MAGNESIUM PO, Magnesium, Disp: , Rfl:    Multiple Vitamin (MULTIVITAMIN ADULT PO), MULTIVITAMIN, Disp: , Rfl:    pantoprazole  (PROTONIX ) 40 MG tablet, Take 40 mg by mouth every morning., Disp: , Rfl:    simvastatin  (ZOCOR ) 40 MG tablet, TAKE 1 TABLET EVERY DAY, Disp: 90 tablet, Rfl: 3  Current Facility-Administered Medications:    triamcinolone  acetonide (KENALOG -40) injection 40 mg, 40 mg, Intramuscular, Once,    Allergies  Allergen Reactions   Penicillins Hives and Other (See Comments)     Review of Systems  Constitutional:  Negative for fever.  HENT:  Negative for congestion.   Respiratory:  Negative for cough and shortness of breath.   Cardiovascular: Negative.   Skin:  Positive for rash.  Neurological: Negative.   Psychiatric/Behavioral: Negative.       Today's Vitals   08/06/24 1607  BP: 120/74  Pulse: 76  Temp: 98  F (36.7 C)  TempSrc: Oral  Weight: 198 lb 12.8 oz (90.2 kg)  Height: 5' 5 (1.651 m)  PainSc: 2   PainLoc: Throat   Body mass index is 33.08 kg/m.  Wt Readings from Last 3 Encounters:  08/06/24 198 lb 12.8 oz (90.2 kg)  06/25/24 196 lb 4.8 oz (89 kg)  04/19/24 200 lb (90.7 kg)     Objective:  Physical Exam Vitals and nursing note reviewed.  Constitutional:      General: She is not in acute distress.    Appearance: Normal appearance. She is obese.  Cardiovascular:     Rate and Rhythm: Normal rate.     Pulses: Normal pulses.     Heart sounds: Normal heart sounds. No murmur heard. Pulmonary:     Effort: Pulmonary effort is normal. No respiratory  distress.     Breath sounds: Normal breath sounds. No wheezing.  Skin:    Capillary Refill: Capillary refill takes less than 2 seconds.  Neurological:     General: No focal deficit present.     Mental Status: She is alert and oriented to person, place, and time.     Cranial Nerves: No cranial nerve deficit.     Motor: No weakness.  Psychiatric:        Mood and Affect: Mood normal.        Behavior: Behavior normal.       Assessment And Plan:  Sore throat Assessment & Plan: Sore throat likely viral, no fever or nasal symptoms, no white spots on throat. Considered prior COVID-19 infection. - Prescribed Z-Pak (azithromycin ) with dosing of two tablets on the first day followed by one tablet daily for the next four days. - Advised warm salt water gargles, hot tea with lemon and honey. - Encouraged adequate hydration. - Consider referral to an otolaryngologist if symptoms persist.  Orders: -     Azithromycin ; Take 2 tablets (500 mg) on  Day 1,  followed by 1 tablet (250 mg) once daily on Days 2 through 5.  Dispense: 6 each; Refill: 0  Non-seasonal allergic rhinitis, unspecified trigger Assessment & Plan: Symptoms include itchy ears, throat sensation, possible postnasal drainage. Ragweed pollen prevalent. - Prescribed Claritin  (loratadine ) to be taken at night.   COVID-19 vaccination declined  Influenza vaccination declined  Other orders -     Loratadine ; Take 1 tablet (10 mg total) by mouth daily.  Dispense: 30 tablet; Refill: 2      Return for keep same next.  Patient was given opportunity to ask questions. Patient verbalized understanding of the plan and was able to repeat key elements of the plan. All questions were answered to their satisfaction.   LILLETTE Briana Ada, FNP, have reviewed all documentation for this visit. The documentation on 08/06/24 for the exam, diagnosis, procedures, and orders are all accurate and complete.   IF YOU HAVE BEEN REFERRED TO A SPECIALIST, IT  MAY TAKE 1-2 WEEKS TO SCHEDULE/PROCESS THE REFERRAL. IF YOU HAVE NOT HEARD FROM US /SPECIALIST IN TWO WEEKS, PLEASE GIVE US  A CALL AT [phone].

## 2024-08-18 DIAGNOSIS — J3089 Other allergic rhinitis: Secondary | ICD-10-CM | POA: Insufficient documentation

## 2024-08-18 NOTE — Assessment & Plan Note (Signed)
 Symptoms include itchy ears, throat sensation, possible postnasal drainage. Ragweed pollen prevalent. - Prescribed Claritin  (loratadine ) to be taken at night.

## 2024-08-18 NOTE — Assessment & Plan Note (Signed)
 Sore throat likely viral, no fever or nasal symptoms, no white spots on throat. Considered prior COVID-19 infection. - Prescribed Z-Pak (azithromycin ) with dosing of two tablets on the first day followed by one tablet daily for the next four days. - Advised warm salt water gargles, hot tea with lemon and honey. - Encouraged adequate hydration. - Consider referral to an otolaryngologist if symptoms persist.

## 2024-08-21 DIAGNOSIS — M9903 Segmental and somatic dysfunction of lumbar region: Secondary | ICD-10-CM | POA: Diagnosis not present

## 2024-08-21 DIAGNOSIS — M48061 Spinal stenosis, lumbar region without neurogenic claudication: Secondary | ICD-10-CM | POA: Diagnosis not present

## 2024-08-21 DIAGNOSIS — S138XXA Sprain of joints and ligaments of other parts of neck, initial encounter: Secondary | ICD-10-CM | POA: Diagnosis not present

## 2024-08-21 DIAGNOSIS — M9904 Segmental and somatic dysfunction of sacral region: Secondary | ICD-10-CM | POA: Diagnosis not present

## 2024-08-21 DIAGNOSIS — M9901 Segmental and somatic dysfunction of cervical region: Secondary | ICD-10-CM | POA: Diagnosis not present

## 2024-08-21 DIAGNOSIS — M9902 Segmental and somatic dysfunction of thoracic region: Secondary | ICD-10-CM | POA: Diagnosis not present

## 2024-08-21 DIAGNOSIS — M5125 Other intervertebral disc displacement, thoracolumbar region: Secondary | ICD-10-CM | POA: Diagnosis not present

## 2024-09-03 ENCOUNTER — Encounter: Payer: Self-pay | Admitting: Nurse Practitioner

## 2024-09-03 ENCOUNTER — Ambulatory Visit: Admitting: Nurse Practitioner

## 2024-09-03 ENCOUNTER — Ambulatory Visit: Payer: Self-pay | Admitting: Nurse Practitioner

## 2024-09-03 VITALS — BP 120/80 | HR 71 | Temp 97.7°F | Ht 65.0 in | Wt 200.8 lb

## 2024-09-03 DIAGNOSIS — I10 Essential (primary) hypertension: Secondary | ICD-10-CM

## 2024-09-03 DIAGNOSIS — M5441 Lumbago with sciatica, right side: Secondary | ICD-10-CM

## 2024-09-03 DIAGNOSIS — G8929 Other chronic pain: Secondary | ICD-10-CM

## 2024-09-03 DIAGNOSIS — R252 Cramp and spasm: Secondary | ICD-10-CM | POA: Diagnosis not present

## 2024-09-03 DIAGNOSIS — M25472 Effusion, left ankle: Secondary | ICD-10-CM | POA: Diagnosis not present

## 2024-09-03 DIAGNOSIS — E782 Mixed hyperlipidemia: Secondary | ICD-10-CM | POA: Diagnosis not present

## 2024-09-03 DIAGNOSIS — M5442 Lumbago with sciatica, left side: Secondary | ICD-10-CM

## 2024-09-03 DIAGNOSIS — R3981 Functional urinary incontinence: Secondary | ICD-10-CM | POA: Diagnosis not present

## 2024-09-03 DIAGNOSIS — Z23 Encounter for immunization: Secondary | ICD-10-CM

## 2024-09-03 DIAGNOSIS — R319 Hematuria, unspecified: Secondary | ICD-10-CM

## 2024-09-03 LAB — POCT URINALYSIS DIPSTICK
Bilirubin, UA: NEGATIVE
Glucose, UA: POSITIVE — AB
Ketones, UA: NEGATIVE
Leukocytes, UA: NEGATIVE
Nitrite, UA: NEGATIVE
Protein, UA: NEGATIVE
Spec Grav, UA: 1.01 (ref 1.010–1.025)
Urobilinogen, UA: 0.2 U/dL
pH, UA: 7 (ref 5.0–8.0)

## 2024-09-03 NOTE — Progress Notes (Signed)
 LILLETTE Kristeen JINNY Gladis, CMA,acting as a Neurosurgeon for Briana Ada, FNP.,have documented all relevant documentation on the behalf of Briana Ada, FNP,as directed by  Briana Ada, FNP while in the presence of Briana Ada, FNP.  Subjective:  Patient ID: [name] , female    DOB: 1951-07-23 , 73 y.o.   MRN: [id number]  Chief Complaint  Patient presents with   Hypertension    Patient presents today for a bp and chol follow up, Patient reports compliance with medication. Patient denies any chest pain, SOB, or headaches. Patient has no concerns today.    Discussed the use of AI scribe software for clinical note transcription with the patient, who gave verbal consent to proceed.  History of Present Illness [name] is a 73 year old female with hypertension and hyperlipidemia who presents for follow-up on cholesterol and blood pressure management.  She is on simvastatin  for cholesterol management and has an ASCVD risk score of 11.6, with a goal of less than 7. She exercises twice a week, engaging in chair yoga for 45 minutes per session, and is considering adding an additional day of exercise.  She experiences chronic leg cramps almost every night, sometimes during the day, and mostly at night. She takes 400 mg of magnesium daily for relief, which may be causing diarrhea, and plans to reduce the dose to 200 mg. She also uses pickle juice and mustard for relief. The leg cramps have been present for years, predating her cholesterol medication, and she takes simvastatin  every other day.  She reports a recent onset of bladder leakage over the past few weeks and has not yet provided a urine sample to rule out a urinary tract infection. She experiences swelling in her left ankle almost daily, which subsides by morning. She drinks about 4 to 6 cups of water daily, with each cup being 16 ounces, and adds lemon juice to her water.  She experiences lower back pain, for which she occasionally takes Tylenol ,  specifically Tylenol  Arthritis 650 mg, usually at bedtime. She has a history of arthritis in the upper back, confirmed by a previous x-ray, and suspects arthritis in the lower back as well. She does not currently see an orthopedic specialist for her back pain but visits [facility] monthly.  She mentions needing to renew her handicap parking permit due to leg cramps and back pain. She has not had a COVID vaccine for the 2025-2026 season yet.   Hypertension This is a chronic problem. The current episode started more than 1 year ago. The problem is unchanged. The problem is controlled. Pertinent negatives include no anxiety, chest pain, headaches or palpitations. There are no associated agents to hypertension. Risk factors for coronary artery disease include obesity and sedentary lifestyle. Past treatments include diuretics and ACE inhibitors. The current treatment provides significant improvement. There are no compliance problems.  There is no history of angina. There is no history of chronic renal disease.     Past Medical History:  Diagnosis Date   Arthritis    Constipation    GERD (gastroesophageal reflux disease)    H/O breast biopsy    Hypertension    Urticaria      Family History  Problem Relation Age of Onset   Diabetes Mother    Heart attack Father    Cancer Brother    Anesthesia problems Neg Hx      Current Outpatient Medications:    aspirin 81 MG tablet, Take 81 mg by mouth daily., Disp: ,  Rfl:    famotidine (PEPCID) 40 MG tablet, Take 40 mg by mouth 2 (two) times daily., Disp: , Rfl:    lisinopril -hydrochlorothiazide  (ZESTORETIC ) 20-12.5 MG tablet, TAKE 1 TABLET EVERY DAY, Disp: 90 tablet, Rfl: 3   loratadine  (CLARITIN ) 10 MG tablet, Take 1 tablet (10 mg total) by mouth daily., Disp: 30 tablet, Rfl: 2   MAGNESIUM PO, Magnesium, Disp: , Rfl:    Multiple Vitamin (MULTIVITAMIN ADULT PO), MULTIVITAMIN, Disp: , Rfl:    pantoprazole  (PROTONIX ) 40 MG tablet, Take 40 mg by mouth  every morning., Disp: , Rfl:    simvastatin  (ZOCOR ) 40 MG tablet, TAKE 1 TABLET EVERY DAY, Disp: 90 tablet, Rfl: 3  Current Facility-Administered Medications:    triamcinolone  acetonide (KENALOG -40) injection 40 mg, 40 mg, Intramuscular, Once,    Allergies  Allergen Reactions   Penicillins Hives and Other (See Comments)     Review of Systems  Constitutional: Negative.   HENT: Negative.    Eyes: Negative.   Respiratory: Negative.    Cardiovascular: Negative.  Negative for chest pain and palpitations.  Gastrointestinal: Negative.   Endocrine: Negative.   Genitourinary: Negative.   Musculoskeletal: Negative.   Skin: Negative.   Allergic/Immunologic: Negative.   Neurological: Negative.  Negative for headaches.  Hematological: Negative.   Psychiatric/Behavioral: Negative.       Today's Vitals   09/03/24 1009  BP: 120/80  Pulse: 71  Temp: 97.7 F (36.5 C)  TempSrc: Oral  Weight: 200 lb 12.8 oz (91.1 kg)  Height: 5' 5 (1.651 m)  PainSc: 0-No pain   Body mass index is 33.41 kg/m.  Wt Readings from Last 3 Encounters:  09/03/24 200 lb 12.8 oz (91.1 kg)  08/06/24 198 lb 12.8 oz (90.2 kg)  06/25/24 196 lb 4.8 oz (89 kg)      Objective:  Physical Exam Vitals and nursing note reviewed.  Constitutional:      General: She is not in acute distress.    Appearance: Normal appearance. She is obese.  Cardiovascular:     Rate and Rhythm: Normal rate and regular rhythm.     Pulses: Normal pulses.     Heart sounds: Normal heart sounds. No murmur heard. Pulmonary:     Effort: Pulmonary effort is normal. No respiratory distress.     Breath sounds: Normal breath sounds. No wheezing.  Skin:    General: Skin is warm and dry.     Capillary Refill: Capillary refill takes less than 2 seconds.  Neurological:     General: No focal deficit present.     Mental Status: She is alert and oriented to person, place, and time.     Cranial Nerves: No cranial nerve deficit.     Motor: No  weakness.  Psychiatric:        Mood and Affect: Mood normal.        Behavior: Behavior normal.        Thought Content: Thought content normal.        Judgment: Judgment normal.         Assessment And Plan:  Essential hypertension Assessment & Plan: Blood pressure is well-controlled at 120/80 mmHg. Continue current medications   Orders: -     BMP8+eGFR  Mixed hyperlipidemia Assessment & Plan: ASCVD risk score is 11.6, goal is less than 7. Simvastatin  is managing cholesterol. - Continue simvastatin . - Encourage dietary modifications, reduce pork intake, consider [place] bacon. - Recheck labs to monitor cholesterol levels.  Orders: -     Lipid panel  Bilateral leg cramps Assessment & Plan: Chronic leg cramps almost nightly, magnesium supplementation may cause diarrhea. - Reduce magnesium supplementation to 200 mg daily. - Order arterial Doppler to assess circulation. - Encourage wearing compression socks. - Increase water intake.  Orders: -     VAS US  LOWER EXTREMITY ARTERIAL DUPLEX; Future  Need for influenza vaccination Assessment & Plan: Influenza vaccine administered Encouraged to take Tylenol  as needed for fever or muscle aches.   Orders: -     Flu vaccine HIGH DOSE PF(Fluzone Trivalent)  Functional urinary incontinence Assessment & Plan: Recent bladder leakage, rule out urinary tract infection. - Obtain urine sample to rule out urinary tract infection. - If no infection, consider referral to physical therapy for pelvic floor training.  Orders: -     POCT urinalysis dipstick  Chronic bilateral low back pain with bilateral sciatica Assessment & Plan: Chronic low back pain likely due to osteoarthritis. - Continue Tylenol  for pain management as needed.   Hematuria, unspecified type Assessment & Plan: Trace blood in urine will send urine for culture  Orders: -     Urine Culture  Left ankle swelling Assessment & Plan: Intermittent swelling resolves  overnight, not worrisome, may relate to past injury or knee issues.      Return for keep same next.  Patient was given opportunity to ask questions. Patient verbalized understanding of the plan and was able to repeat key elements of the plan. All questions were answered to their satisfaction.   LILLETTE Briana Ada, FNP, have reviewed all documentation for this visit. The documentation on 09/03/24 for the exam, diagnosis, procedures, and orders are all accurate and complete.   IF YOU HAVE BEEN REFERRED TO A SPECIALIST, IT MAY TAKE 1-2 WEEKS TO SCHEDULE/PROCESS THE REFERRAL. IF YOU HAVE NOT HEARD FROM US /SPECIALIST IN TWO WEEKS, PLEASE GIVE US  A CALL AT [phone].

## 2024-09-04 LAB — LIPID PANEL
Chol/HDL Ratio: 3.4 ratio (ref 0.0–4.4)
Cholesterol, Total: 200 mg/dL — ABNORMAL HIGH (ref 100–199)
HDL: 58 mg/dL (ref >39)
LDL Chol Calc (NIH): 111 mg/dL — ABNORMAL HIGH (ref 0–99)
Triglycerides: 180 mg/dL — ABNORMAL HIGH (ref 0–149)
VLDL Cholesterol Cal: 31 mg/dL (ref 5–40)

## 2024-09-04 LAB — BMP8+EGFR
BUN/Creatinine Ratio: 17 (ref 12–28)
BUN: 22 mg/dL (ref 8–27)
CO2: 26 mmol/L (ref 20–29)
Calcium: 10 mg/dL (ref 8.7–10.3)
Chloride: 99 mmol/L (ref 96–106)
Creatinine, Ser: 1.33 mg/dL — ABNORMAL HIGH (ref 0.57–1.00)
Glucose: 64 mg/dL — ABNORMAL LOW (ref 70–99)
Potassium: 4.3 mmol/L (ref 3.5–5.2)
Sodium: 139 mmol/L (ref 134–144)
eGFR: 42 mL/min/1.73 — ABNORMAL LOW (ref >59)

## 2024-09-05 LAB — URINE CULTURE: Organism ID, Bacteria: NO GROWTH

## 2024-09-15 DIAGNOSIS — M25472 Effusion, left ankle: Secondary | ICD-10-CM | POA: Insufficient documentation

## 2024-09-15 DIAGNOSIS — R319 Hematuria, unspecified: Secondary | ICD-10-CM | POA: Insufficient documentation

## 2024-09-15 NOTE — Assessment & Plan Note (Signed)
 Intermittent swelling resolves overnight, not worrisome, may relate to past injury or knee issues.

## 2024-09-15 NOTE — Assessment & Plan Note (Signed)
 Influenza vaccine administered Encouraged to take Tylenol as needed for fever or muscle aches.

## 2024-09-15 NOTE — Assessment & Plan Note (Signed)
 Blood pressure is well-controlled at 120/80 mmHg. Continue current medications

## 2024-09-15 NOTE — Assessment & Plan Note (Signed)
 Chronic low back pain likely due to osteoarthritis. - Continue Tylenol  for pain management as needed.

## 2024-09-15 NOTE — Assessment & Plan Note (Signed)
 ASCVD risk score is 11.6, goal is less than 7. Simvastatin  is managing cholesterol. - Continue simvastatin . - Encourage dietary modifications, reduce pork intake, consider [place] bacon. - Recheck labs to monitor cholesterol levels.

## 2024-09-15 NOTE — Assessment & Plan Note (Signed)
 Chronic leg cramps almost nightly, magnesium supplementation may cause diarrhea. - Reduce magnesium supplementation to 200 mg daily. - Order arterial Doppler to assess circulation. - Encourage wearing compression socks. - Increase water intake.

## 2024-09-15 NOTE — Assessment & Plan Note (Signed)
 Recent bladder leakage, rule out urinary tract infection. - Obtain urine sample to rule out urinary tract infection. - If no infection, consider referral to physical therapy for pelvic floor training.

## 2024-09-15 NOTE — Assessment & Plan Note (Signed)
 Trace blood in urine will send urine for culture

## 2024-09-16 ENCOUNTER — Other Ambulatory Visit: Payer: Self-pay | Admitting: Nurse Practitioner

## 2024-09-16 DIAGNOSIS — R252 Cramp and spasm: Secondary | ICD-10-CM

## 2024-09-18 DIAGNOSIS — M5125 Other intervertebral disc displacement, thoracolumbar region: Secondary | ICD-10-CM | POA: Diagnosis not present

## 2024-09-18 DIAGNOSIS — M48061 Spinal stenosis, lumbar region without neurogenic claudication: Secondary | ICD-10-CM | POA: Diagnosis not present

## 2024-09-18 DIAGNOSIS — M9902 Segmental and somatic dysfunction of thoracic region: Secondary | ICD-10-CM | POA: Diagnosis not present

## 2024-09-18 DIAGNOSIS — M9903 Segmental and somatic dysfunction of lumbar region: Secondary | ICD-10-CM | POA: Diagnosis not present

## 2024-09-18 DIAGNOSIS — M9901 Segmental and somatic dysfunction of cervical region: Secondary | ICD-10-CM | POA: Diagnosis not present

## 2024-09-18 DIAGNOSIS — S138XXA Sprain of joints and ligaments of other parts of neck, initial encounter: Secondary | ICD-10-CM | POA: Diagnosis not present

## 2024-09-18 DIAGNOSIS — M9904 Segmental and somatic dysfunction of sacral region: Secondary | ICD-10-CM | POA: Diagnosis not present

## 2024-09-19 ENCOUNTER — Ambulatory Visit (HOSPITAL_COMMUNITY)
Admission: RE | Admit: 2024-09-19 | Discharge: 2024-09-19 | Disposition: A | Discharge status: Home / Self Care | Source: Ambulatory Visit | Attending: Nurse Practitioner | Admitting: Nurse Practitioner

## 2024-09-19 ENCOUNTER — Ambulatory Visit (HOSPITAL_BASED_OUTPATIENT_CLINIC_OR_DEPARTMENT_OTHER)
Admission: RE | Admit: 2024-09-19 | Discharge: 2024-09-19 | Disposition: A | Discharge status: Home / Self Care | Source: Ambulatory Visit | Attending: Nurse Practitioner | Admitting: Nurse Practitioner

## 2024-09-19 ENCOUNTER — Encounter (HOSPITAL_COMMUNITY): Payer: Self-pay

## 2024-09-19 DIAGNOSIS — R252 Cramp and spasm: Secondary | ICD-10-CM | POA: Insufficient documentation

## 2024-09-20 ENCOUNTER — Other Ambulatory Visit: Payer: Self-pay

## 2024-09-20 DIAGNOSIS — N1831 Chronic kidney disease, stage 3a: Secondary | ICD-10-CM | POA: Diagnosis not present

## 2024-09-20 DIAGNOSIS — E782 Mixed hyperlipidemia: Secondary | ICD-10-CM

## 2024-09-20 LAB — VAS US ABI WITH/WO TBI
Left ABI: 1.07
Right ABI: 1.05

## 2024-09-26 ENCOUNTER — Ambulatory Visit: Admitting: Allergy & Immunology

## 2024-09-26 LAB — MULTIPLE MYELOMA PANEL, SERUM
Albumin SerPl Elph-Mcnc: 3.5 g/dL (ref 2.9–4.4)
Albumin/Glob SerPl: 1.2 (ref 0.7–1.7)
Alpha 1: 0.2 g/dL (ref 0.0–0.4)
Alpha2 Glob SerPl Elph-Mcnc: 0.7 g/dL (ref 0.4–1.0)
B-Globulin SerPl Elph-Mcnc: 1.1 g/dL (ref 0.7–1.3)
Gamma Glob SerPl Elph-Mcnc: 1 g/dL (ref 0.4–1.8)
Globulin, Total: 3.1 g/dL (ref 2.2–3.9)
IgA/Immunoglobulin A, Serum: 250 mg/dL (ref 64–422)
IgG (Immunoglobin G), Serum: 1138 mg/dL (ref 586–1602)
IgM (Immunoglobulin M), Srm: 67 mg/dL (ref 26–217)
Total Protein: 6.6 g/dL (ref 6.0–8.5)

## 2024-09-26 LAB — PTH, INTACT AND CALCIUM
Calcium: 9.5 mg/dL (ref 8.7–10.3)
PTH: 36 pg/mL (ref 15–65)

## 2024-09-26 LAB — LIPOPROTEIN A (LPA): Lipoprotein (a): 11.8 nmol/L (ref <75.0)

## 2024-10-02 ENCOUNTER — Ambulatory Visit: Payer: Self-pay | Admitting: Nurse Practitioner

## 2024-10-03 ENCOUNTER — Ambulatory Visit: Payer: Self-pay | Admitting: Nurse Practitioner

## 2024-10-03 ENCOUNTER — Other Ambulatory Visit: Payer: Self-pay | Admitting: Nurse Practitioner

## 2024-10-03 DIAGNOSIS — N1831 Chronic kidney disease, stage 3a: Secondary | ICD-10-CM

## 2024-10-16 DIAGNOSIS — M5125 Other intervertebral disc displacement, thoracolumbar region: Secondary | ICD-10-CM | POA: Diagnosis not present

## 2024-10-16 DIAGNOSIS — M48061 Spinal stenosis, lumbar region without neurogenic claudication: Secondary | ICD-10-CM | POA: Diagnosis not present

## 2024-10-16 DIAGNOSIS — M9903 Segmental and somatic dysfunction of lumbar region: Secondary | ICD-10-CM | POA: Diagnosis not present

## 2024-10-16 DIAGNOSIS — M9902 Segmental and somatic dysfunction of thoracic region: Secondary | ICD-10-CM | POA: Diagnosis not present

## 2024-10-16 DIAGNOSIS — M9901 Segmental and somatic dysfunction of cervical region: Secondary | ICD-10-CM | POA: Diagnosis not present

## 2024-10-16 DIAGNOSIS — M9904 Segmental and somatic dysfunction of sacral region: Secondary | ICD-10-CM | POA: Diagnosis not present

## 2024-10-16 DIAGNOSIS — S138XXA Sprain of joints and ligaments of other parts of neck, initial encounter: Secondary | ICD-10-CM | POA: Diagnosis not present

## 2024-10-21 ENCOUNTER — Encounter: Payer: Self-pay | Admitting: *Deleted

## 2024-10-21 ENCOUNTER — Ambulatory Visit: Payer: Self-pay

## 2024-10-21 ENCOUNTER — Ambulatory Visit: Admission: EM | Admit: 2024-10-21 | Discharge: 2024-10-21 | Disposition: A | Discharge status: Home / Self Care

## 2024-10-21 DIAGNOSIS — R42 Dizziness and giddiness: Secondary | ICD-10-CM

## 2024-10-21 LAB — POCT URINE DIPSTICK
Bilirubin, UA: NEGATIVE
Blood, UA: NEGATIVE
Glucose, UA: NEGATIVE mg/dL
Ketones, POC UA: NEGATIVE mg/dL
Leukocytes, UA: NEGATIVE
Nitrite, UA: NEGATIVE
POC PROTEIN,UA: NEGATIVE
Spec Grav, UA: 1.015 (ref 1.010–1.025)
Urobilinogen, UA: 0.2 U/dL
pH, UA: 7 (ref 5.0–8.0)

## 2024-10-21 LAB — GLUCOSE, POCT (MANUAL RESULT ENTRY): POC Glucose: 96 mg/dL (ref 70–99)

## 2024-10-21 NOTE — ED Provider Notes (Signed)
 [facility]    CSN: [id number] Arrival date & time: 10/21/24  1132      History   Chief Complaint Chief Complaint  Patient presents with   Dizziness    HPI [name] is a 73 y.o. female.   Pt states that she was experienced lightheadedness and weakness yesterday and the day before. Pt states that symptoms were the worse yesterday, states that she was helping at church and she just started to experience the symptoms. Pt states that she sat down, turned on the fan, and drank some water and was able to get herself together. Pt states that she is feeling better today but states that she is feeling off but can't put her finger on what is wrong. Pt states that she has been experiencing some urinary symptoms, states that she has some increased urgency in the past week.   The history is provided by the patient.  Dizziness   Past Medical History:  Diagnosis Date   Arthritis    Constipation    GERD (gastroesophageal reflux disease)    H/O breast biopsy    Hypertension    Urticaria     Patient Active Problem List   Diagnosis Date Noted   Left ankle swelling 09/15/2024   Hematuria 09/15/2024   Bilateral leg cramps 09/03/2024   Need for influenza vaccination 09/03/2024   Functional urinary incontinence 09/03/2024   Non-seasonal allergic rhinitis 08/18/2024   Rash and nonspecific skin eruption 04/26/2024   Allergic reaction 04/26/2024   Encounter for annual health examination 03/12/2024   Leukocytes in urine 03/12/2024   Does use hearing aid 03/04/2024   Bilateral hearing loss 03/04/2024   Chronic bilateral thoracic back pain 03/04/2024   COVID-19 vaccine administered 03/04/2024   Class 1 obesity due to excess calories with body mass index (BMI) of 31.0 to 31.9 in adult 04/29/2023   Decreased hearing of both ears 04/19/2023   Chronic bilateral low back pain with bilateral sciatica 04/05/2023   Belching 01/10/2023   Adjustment insomnia 08/17/2020   Grief  08/17/2020   Mixed hyperlipidemia 07/24/2019   Prediabetes 07/24/2019   Sore throat 03/07/2019   Gastroesophageal reflux disease without esophagitis 10/15/2018   Essential hypertension 10/15/2018    Past Surgical History:  Procedure Laterality Date   ABDOMINAL HYSTERECTOMY     BREAST BIOPSY Left 01/2013   BREAST SURGERY     bio  neg cancer   EYE SURGERY  2019   bilateral cataract surgery    OB History   No obstetric history on file.      Home Medications    Prior to Admission medications   Medication Sig Start Date End Date Taking? Authorizing Provider  aspirin 81 MG tablet Take 81 mg by mouth daily.   Yes [provider]  co-enzyme Q-10 30 MG capsule Take 30 mg by mouth 3 (three) times daily.   Yes [provider]  lisinopril -hydrochlorothiazide  (ZESTORETIC ) 20-12.5 MG tablet TAKE 1 TABLET EVERY DAY 08/05/24  Yes Georgina Speaks, FNP  MAGNESIUM PO Magnesium   Yes [provider]  Multiple Vitamin (MULTIVITAMIN ADULT PO) MULTIVITAMIN   Yes [provider]  pantoprazole  (PROTONIX ) 40 MG tablet Take 40 mg by mouth every morning. 01/01/23  Yes [provider]  simvastatin  (ZOCOR ) 40 MG tablet TAKE 1 TABLET EVERY DAY 08/05/24  Yes Georgina Speaks, FNP  famotidine (PEPCID) 40 MG tablet Take 40 mg by mouth 2 (two) times daily. Patient not taking: Reported on 10/21/2024 01/04/24  [provider]  loratadine  (CLARITIN ) 10 MG tablet Take 1 tablet (10 mg total) by mouth daily. Patient not taking: Reported on 10/21/2024 08/06/24   Georgina Speaks, FNP  MNEXSPIKE 10 MCG/0.2ML SUSY  09/04/24   [provider]    Family History Family History  Problem Relation Age of Onset   Diabetes Mother    Heart attack Father    Cancer Brother    Anesthesia problems Neg Hx     Social History Social History   Tobacco Use   Smoking status: Never   Smokeless tobacco: Never  Vaping Use   Vaping status: Never Used  Substance Use Topics    Alcohol use: No   Drug use: No     Allergies   Penicillins   Review of Systems Review of Systems  Neurological:  Positive for dizziness.     Physical Exam Triage Vital Signs ED Triage Vitals [10/21/24 1223]  Encounter Vitals Group     BP      Girls Systolic BP Percentile      Girls Diastolic BP Percentile      Boys Systolic BP Percentile      Boys Diastolic BP Percentile      Pulse      Resp      Temp      Temp src      SpO2      Weight      Height      Head Circumference      Peak Flow      Pain Score 0     Pain Loc      Pain Education      Exclude from Growth Chart    No data found.  Updated Vital Signs BP (!) 145/81 (BP Location: Left Arm)   Pulse 86   Temp 97.7 F (36.5 C) (Oral)   Resp 16   SpO2 98%   Visual Acuity Right Eye Distance:   Left Eye Distance:   Bilateral Distance:    Right Eye Near:   Left Eye Near:    Bilateral Near:     Physical Exam Vitals and nursing note reviewed.  Constitutional:      General: She is not in acute distress.    Appearance: Normal appearance. She is not ill-appearing, toxic-appearing or diaphoretic.  HENT:     Right Ear: Tympanic membrane, ear canal and external ear normal.     Left Ear: Tympanic membrane, ear canal and external ear normal.  Eyes:     General: No scleral icterus. Cardiovascular:     Rate and Rhythm: Normal rate and regular rhythm.     Heart sounds: Normal heart sounds.  Pulmonary:     Effort: Pulmonary effort is normal. No respiratory distress.     Breath sounds: Normal breath sounds. No wheezing or rhonchi.  Abdominal:     General: Abdomen is flat. Bowel sounds are normal.     Palpations: Abdomen is soft.     Tenderness: There is no abdominal tenderness. There is no right CVA tenderness or left CVA tenderness.  Skin:    General: Skin is warm.  Neurological:     Mental Status: She is alert and oriented to person, place, and time.  Psychiatric:        Mood and Affect: Mood normal.         Behavior: Behavior normal.      UC Treatments / Results  Labs (all labs ordered are listed, but only abnormal  results are displayed) Labs Reviewed  GLUCOSE, POCT (MANUAL RESULT ENTRY) - Normal  POCT URINE DIPSTICK    EKG   Radiology No results found.  Procedures Procedures (including critical care time)  Medications Ordered in UC Medications - No data to display  Initial Impression / Assessment and Plan / UC Course  I have reviewed the triage vital signs and the nursing notes.  Pertinent labs & imaging results that were available during my care of the patient were reviewed by me and considered in my medical decision making (see chart for details).  Final Clinical Impressions(s) / UC Diagnoses   Final diagnoses:  Vertigo   Discharge Instructions   None    ED Prescriptions   None    PDMP not reviewed this encounter.   Andra Corean BROCKS, PA-C 10/21/24 1350

## 2024-10-21 NOTE — Telephone Encounter (Signed)
 FYI Only or Action Required?: FYI only for provider: going to urgent care.  Patient was last seen in primary care on 09/03/2024 by Georgina Speaks, FNP.  Called Nurse Triage reporting Dizziness.  Symptoms began several days ago.  Interventions attempted: Rest, hydration, or home remedies.  Symptoms are: unchanged.  Triage Disposition: See Physician Within 24 Hours  Patient/caregiver understands and will follow disposition?: No appointment, going to urgent care      Copied from CRM #[id number]. Topic: Clinical - Red Word Triage >> Oct 21, 2024  9:57 AM Willma SAUNDERS wrote: Red Word that prompted transfer to Nurse Triage: Patient has been experiencing some light-headedness and dizziness.       Reason for Disposition  [1] MODERATE dizziness (e.g., interferes with normal activities) AND [2] has NOT been evaluated by doctor (or NP/PA) for this  (Exception: Dizziness caused by heat exposure, sudden standing, or poor fluid intake.)  Answer Assessment - Initial Assessment Questions 1. DESCRIPTION: Describe your dizziness.     Dizzy and lightheaded  2. LIGHTHEADED: Do you feel lightheaded? (e.g., somewhat faint, woozy, weak upon standing)     Yes 3. VERTIGO: Do you feel like either you or the room is spinning or tilting? (i.e., vertigo)     Yes 4. SEVERITY: How bad is it?  Do you feel like you are going to faint? Can you stand and walk?     Mild to moderate  5. ONSET:  When did the dizziness begin?     2 days ago  7. HEART RATE: Can you tell me your heart rate? How many beats in 15 seconds?  (Note: Not all patients can do this.)       No 8. CAUSE: What do you think is causing the dizziness? (e.g., decreased fluids or food, diarrhea, emotional distress, heat exposure, new medicine, sudden standing, vomiting; unknown)     Unsure  9. RECURRENT SYMPTOM: Have you had dizziness before? If Yes, ask: When was the last time? What happened that time?     No 10. OTHER  SYMPTOMS: Do you have any other symptoms? (e.g., fever, chest pain, vomiting, diarrhea, bleeding)       No  Protocols used: Dizziness - Lightheadedness-A-AH

## 2024-10-21 NOTE — ED Triage Notes (Signed)
 Pt reports brief episode of dizziness on Saturday that resolved. Sunday she got dizzy when I got to church I didn't feel myself. States she was helping in the kitchen and It was hot in there. She drank some fluids and rested. Then when she got home she took her BP meds. States she feels better today but still feels like something is off. She came here because he PCP couldn't see her until next week.  Grips strong and equal, facial symmetry present, no drift, speech clear.

## 2024-10-21 NOTE — Discharge Instructions (Addendum)
 All POCT tests done in office are within normal limits. If symptoms persists please follow-up with PCP.

## 2024-10-23 DIAGNOSIS — I129 Hypertensive chronic kidney disease with stage 1 through stage 4 chronic kidney disease, or unspecified chronic kidney disease: Secondary | ICD-10-CM | POA: Diagnosis not present

## 2024-10-23 DIAGNOSIS — N1832 Chronic kidney disease, stage 3b: Secondary | ICD-10-CM | POA: Diagnosis not present

## 2024-10-23 DIAGNOSIS — R319 Hematuria, unspecified: Secondary | ICD-10-CM | POA: Diagnosis not present

## 2024-10-23 DIAGNOSIS — R7303 Prediabetes: Secondary | ICD-10-CM | POA: Diagnosis not present

## 2024-10-30 DIAGNOSIS — N1832 Chronic kidney disease, stage 3b: Secondary | ICD-10-CM | POA: Diagnosis not present

## 2024-10-31 ENCOUNTER — Other Ambulatory Visit: Payer: Self-pay | Admitting: Nurse Practitioner

## 2024-11-05 ENCOUNTER — Ambulatory Visit: Payer: Self-pay

## 2024-11-05 NOTE — Telephone Encounter (Signed)
 FYI Only or Action Required?: FYI only for provider: appointment scheduled on 11/06/24.  Patient was last seen in primary care on 09/03/2024 by Georgina Speaks, FNP.  Called Nurse Triage reporting Dizziness.  Symptoms began several weeks ago.  Interventions attempted: Nothing.  Symptoms are: stable.  Triage Disposition: See PCP When Office is Open (Within 3 Days)  Patient/caregiver understands and will follow disposition?: Yes Reason for Disposition  [1] MODERATE dizziness (e.g., interferes with normal activities) AND [2] has been evaluated by doctor (or NP/PA) for this  Answer Assessment - Initial Assessment Questions Patient seen in UC 11/24, was told had vertigo and was told to follow up with PCP. Patient takes BP medication every day, but does not have a monitor at home to take it. Patient reports drinking a lot of water.   1. DESCRIPTION: Describe your dizziness.     Occurs when standing or moving too quickly   2. LIGHTHEADED: Do you feel lightheaded? (e.g., somewhat faint, woozy, weak upon standing)     Getting dizzy but denies feeling faint  3. VERTIGO: Do you feel like either you or the room is spinning or tilting? (i.e., vertigo)     Denies  4. SEVERITY: How bad is it?  Do you feel like you are going to faint? Can you stand and walk?     Mild, just moving slowly  5. ONSET:  When did the dizziness begin?     11/23  6. AGGRAVATING FACTORS: Does anything make it worse? (e.g., standing, change in head position)     Standing, changing head position quickly  7. HEART RATE: Can you tell me your heart rate? How many beats in 15 seconds?  (Note: Not all patients can do this.)      Die not notice increased heart rate  8. CAUSE: What do you think is causing the dizziness? (e.g., decreased fluids or food, diarrhea, emotional distress, heat exposure, new medicine, sudden standing, vomiting; unknown)     Unsure  9. RECURRENT SYMPTOM: Have you had dizziness  before? If Yes, ask: When was the last time? What happened that time?     Had vertigo several years ago, went to ED, can't remember if was given medication  10. OTHER SYMPTOMS: Do you have any other symptoms? (e.g., fever, chest pain, vomiting, diarrhea, bleeding)       Denies  Protocols used: Dizziness - Lightheadedness-A-AH  Copied from CRM #[id number]. Topic: Clinical - Red Word Triage >> Nov 05, 2024 11:35 AM Willma SAUNDERS wrote: [facility] that prompted transfer to Nurse Triage: Patient was diagnosed with 3 weeks ago with vertigo at [facility], wasn't provided anything and was advised to see her pcp if it go worse. Patient states she is still feeling dizzy and lightheaded.

## 2024-11-06 ENCOUNTER — Encounter: Payer: Self-pay | Admitting: Family Medicine

## 2024-11-06 ENCOUNTER — Ambulatory Visit: Payer: Self-pay | Admitting: Family Medicine

## 2024-11-06 VITALS — BP 130/70 | HR 69 | Temp 99.0°F | Ht 65.0 in | Wt 200.0 lb

## 2024-11-06 DIAGNOSIS — I129 Hypertensive chronic kidney disease with stage 1 through stage 4 chronic kidney disease, or unspecified chronic kidney disease: Secondary | ICD-10-CM

## 2024-11-06 DIAGNOSIS — R42 Dizziness and giddiness: Secondary | ICD-10-CM | POA: Diagnosis not present

## 2024-11-06 DIAGNOSIS — N1831 Chronic kidney disease, stage 3a: Secondary | ICD-10-CM | POA: Diagnosis not present

## 2024-11-06 DIAGNOSIS — H906 Mixed conductive and sensorineural hearing loss, bilateral: Secondary | ICD-10-CM

## 2024-11-06 MED ORDER — MECLIZINE HCL 12.5 MG PO TABS: 12.5000 mg | ORAL_TABLET | Freq: Two times a day (BID) | ORAL | 0 refills | Status: AC | PRN

## 2024-11-06 MED ORDER — MECLIZINE HCL 12.5 MG PO TABS: 12.5000 mg | ORAL_TABLET | Freq: Two times a day (BID) | ORAL | 0 refills | Status: DC | PRN

## 2024-11-06 NOTE — Progress Notes (Unsigned)
 I,[name], CMA,acting as a neurosurgeon for [employer], NP.,have documented all relevant documentation on the behalf of Briana Creighton, NP,as directed by  Briana Creighton, NP while in the presence of Briana Creighton, NP.  Subjective:  Patient ID: [name] , female    DOB: January 10, 1951 , 73 y.o.   MRN: [id number]  Chief Complaint  Patient presents with   Dizziness    Patient presents today for vertigo. She reports she went to urgent care at the end of November for dizziness and was diagnosed with vertigo but no meds were given. She reports she only has the dizziness if she jumps up to fast.    HPI  HPI   Past Medical History:  Diagnosis Date   Arthritis    Constipation    GERD (gastroesophageal reflux disease)    H/O breast biopsy    Hypertension    Urticaria      Family History  Problem Relation Age of Onset   Diabetes Mother    Heart attack Father    Cancer Brother    Anesthesia problems Neg Hx      Current Outpatient Medications:    aspirin 81 MG tablet, Take 81 mg by mouth daily., Disp: , Rfl:    co-enzyme Q-10 30 MG capsule, Take 30 mg by mouth 3 (three) times daily., Disp: , Rfl:    famotidine (PEPCID) 40 MG tablet, Take 40 mg by mouth 2 (two) times daily., Disp: , Rfl:    lisinopril -hydrochlorothiazide  (ZESTORETIC ) 20-12.5 MG tablet, TAKE 1 TABLET EVERY DAY, Disp: 90 tablet, Rfl: 3   loratadine  (CLARITIN ) 10 MG tablet, TAKE 1 TABLET BY MOUTH EVERY DAY, Disp: 90 tablet, Rfl: 2   MAGNESIUM PO, Magnesium, Disp: , Rfl:    Multiple Vitamin (MULTIVITAMIN ADULT PO), MULTIVITAMIN, Disp: , Rfl:    simvastatin  (ZOCOR ) 40 MG tablet, TAKE 1 TABLET EVERY DAY, Disp: 90 tablet, Rfl: 3   pantoprazole  (PROTONIX ) 40 MG tablet, Take 40 mg by mouth every morning. (Patient not taking: Reported on 11/06/2024), Disp: , Rfl:   Current Facility-Administered Medications:    triamcinolone  acetonide (KENALOG -40) injection 40 mg, 40 mg, Intramuscular, Once,    Allergies  Allergen Reactions    Penicillins Hives and Other (See Comments)     Review of Systems   Today's Vitals   11/06/24 1137  BP: 130/70  Pulse: 69  Temp: 99 F (37.2 C)  TempSrc: Oral  Weight: 200 lb (90.7 kg)  Height: 5' 5 (1.651 m)  PainSc: 4   PainLoc: Back   Body mass index is 33.28 kg/m.  Wt Readings from Last 3 Encounters:  11/06/24 200 lb (90.7 kg)  09/03/24 200 lb 12.8 oz (91.1 kg)  08/06/24 198 lb 12.8 oz (90.2 kg)    The 10-year ASCVD risk score (Arnett DK, et al., 2019) is: 14.5%   Values used to calculate the score:     Age: 35 years     Clincally relevant sex: Female     Is Non-Hispanic African American: Yes     Diabetic: No     Tobacco smoker: No     Systolic Blood Pressure: 130 mmHg     Is BP treated: Yes     HDL Cholesterol: 58 mg/dL     Total Cholesterol: 200 mg/dL  Objective:  Physical Exam      Assessment And Plan:   Assessment & Plan Vertigo  Class 1 obesity due to excess calories with body mass index (BMI) of 33.0 to  33.9 in adult, unspecified whether serious comorbidity present   No orders of the defined types were placed in this encounter.    Return if symptoms worsen or fail to improve.  Patient was given opportunity to ask questions. Patient verbalized understanding of the plan and was able to repeat key elements of the plan. All questions were answered to their satisfaction.    I, Briana Creighton, NP, have reviewed all documentation for this visit. The documentation on 11/06/24 for the exam, diagnosis, procedures, and orders are all accurate and complete.   IF YOU HAVE BEEN REFERRED TO A SPECIALIST, IT MAY TAKE 1-2 WEEKS TO SCHEDULE/PROCESS THE REFERRAL. IF YOU HAVE NOT HEARD FROM US /SPECIALIST IN TWO WEEKS, PLEASE GIVE US  A CALL AT [phone].

## 2024-11-19 DIAGNOSIS — N1831 Chronic kidney disease, stage 3a: Secondary | ICD-10-CM | POA: Insufficient documentation

## 2024-11-19 DIAGNOSIS — R42 Dizziness and giddiness: Secondary | ICD-10-CM | POA: Insufficient documentation

## 2024-11-19 NOTE — Assessment & Plan Note (Signed)
 Intermittent dizziness without room spinning. Previous episode over ten years ago. Recent urgent care visit confirmed vertigo without ear infection. - Prescribed meclizine  as needed, cautioning drowsiness. - Advised nighttime dosing of meclizine . - Recommended ENT follow-up for hearing aids and vertigo evaluation. - Consider CT head if symptoms persist.

## 2024-11-19 NOTE — Assessment & Plan Note (Signed)
 Blood pressure improved to 130/70 mmHg. Current medication includes a diuretic. Recent labs normal. - Ensure adequate hydration. - Continue current blood pressure regimen.

## 2024-11-19 NOTE — Assessment & Plan Note (Signed)
 Uses hearing aids and ENT appointment recommended for evaluation.

## 2024-11-27 NOTE — Progress Notes (Signed)
 Otolaryngology Clinic Note  HPI:    sensorineural hearing loss, bilateral  [name] is a 73 y.o. female who presents as a new patient, referred by self, for evaluation and treatment of evaluation of hearing loss she was seen for an audiogram prior 05/03/2024 but this is her first ENT visit she wears hearing aids and her managing audiologist is [name], [place].  Patient reports that she has had some issues with the fitting of her hearing aid and feels that originally when her hearing aids were fitted that they worked well but she is now reporting some irritation with wearing her hearing aids and will be seeing [name], AuD for follow-up visit for this.  Patient reports that about a month ago she had instance of a vestibular disturbance that she noted while in church she denies feeling that it was an actual spinning sensation but that during her meeting at church she felt not herself and off-balance sensation she was seen by her PCP and urgent care and was given meclizine  which helped with the symptoms and it has taken a while to resolve but she currently feels her symptoms have all but resolved.  She reports a remote history of this happening many years ago.  Patient denies tinnitus and other than the hearing aid fitting feels that things are going well with her hearing aid use.  PMH/Meds/All/SocHx/FamHx/ROS:   Medical History[1]  Surgical History[2]  No family history of bleeding disorders, wound healing problems or difficulty with anesthesia.   Social History   Socioeconomic History   Marital status: Widowed    Spouse name: Not on file   Number of children: Not on file   Years of education: Not on file   Highest education level: Not on file  Occupational History   Not on file  Tobacco Use   Smoking status: Never   Smokeless tobacco: Never  Substance and Sexual Activity   Alcohol use: Never   Drug use: Not on file   Sexual activity: Not on file  Other Topics  Concern   Not on file  Social History Narrative   Not on file   Social Drivers of Health   Living Situation: Unknown (04/10/2024)   Received from [facility]   Living Situation    In the last 12 months, was there a time when you were not able to pay the mortgage or rent on time?: No    Number of Times Moved in the Last Year: Not on file    At any time in the past 12 months, were you homeless or living in a shelter (including now)?: No  Food Insecurity: No Food Insecurity (04/10/2024)   Received from [facility]   Food vital sign    Within the past 12 months, you worried that your food would run out before you got money to buy more: Never true    Within the past 12 months, the food you bought just didn't last and you didn't have money to get more: Never true  Transportation Needs: No Transportation Needs (04/10/2024)   Received from [facility] - Transportation    Lack of Transportation (Medical): No    Lack of Transportation (Non-Medical): No  Utilities: Not At Risk (04/05/2023)   Received from [facility]    Threatened with loss of utilities: No  Safety: Not At Risk (04/10/2024)   Received from [facility]   Safety    Within the last year, have  you been afraid of your partner or ex-partner?: No    Within the last year, have you been humiliated or emotionally abused in other ways by your partner or ex-partner?: No    Within the last year, have you been kicked, hit, slapped, or otherwise physically hurt by your partner or ex-partner?: No    Within the last year, have you been raped or forced to have any kind of sexual activity by your partner or ex-partner?: No  Alcohol Screening: Not on file  Tobacco Use: Low Risk (11/06/2024)   Received from [facility]   Patient History    Smoking Tobacco Use: Never    Smokeless Tobacco Use: Never    Passive Exposure: Not on file  Depression: Not on file  Social Connections: Moderately Isolated  (04/10/2024)   Received from [facility]   Social Connection and Isolation Panel    In a typical week, how many times do you talk on the phone with family, friends, or neighbors?: Three times a week    How often do you get together with friends or relatives?: Once a week    How often do you attend church or religious services?: More than 4 times per year    Do you belong to any clubs or organizations such as church groups, unions, fraternal or athletic groups, or school groups?: No    How often do you attend meetings of the clubs or organizations you belong to?: Never    Are you married, widowed, divorced, separated, never married, or living with a partner?: Widowed  Physicist, Medical Strain: Low Risk (04/10/2024)   Received from [facility]   Overall Financial Resource Strain (CARDIA)    Difficulty of Paying Living Expenses: Not hard at all    Current Medications[3]  A complete ROS was performed with pertinent positives/negatives noted in the HPI. The remainder of the ROS are negative.    Physical Exam:    BP 138/82 (BP Location: Left arm, Patient Position: Sitting)   Pulse 71   Temp 97.3 F (36.3 C) (Temporal)   Ht 1.676 m (5' 6)   Wt 92 kg (202 lb 12.8 oz)   BMI 32.73 kg/m    General: Well developed, well nourished. No acute distress. Voice normal.  Head/Face: Normocephalic, atraumatic. No scars or lesions. No sinus tenderness. Facial nerve intact and equal bilaterally. No facial lacerations. TMJ nontender to palpation.  Eyes: Pupils are equal, round and reactive to light. Conjunctiva and lids are normal. Normal extraocular mobility.   Ears:   Right: Pinna and external meatus normal, normal ear canal skin and caliber without excessive cerumen or drainage. Tympanic membrane intact without effusion or infection.    Left: Pinna and external meatus normal, normal ear canal skin and caliber without excessive cerumen or drainage. Tympanic membrane intact without effusion or  infection.   Hearing: Normal speech reception to conversational tone.  Nose: No gross deformity or lesions. No purulent discharge. Septum midline, no turbinate hypertrophy with normal mucosa.  Mouth/Oropharynx: Lips normal, teeth and gums normal with good dentition, normal oral vestibule. Normal floor of mouth, tongue and oral mucosa, no mucosal lesions, ulcer or mass, normal tongue mobility. Uvula midline. Hard and soft palate normal with normal mobility. +1 tonsils, no erythema or exudate.  Larynx: See TFL if applicable  Nasopharynx: See TFL if applicable  Neck: Trachea midline. No masses. No crepitus. Normal thyroid  gland palpation without thyromegaly or nodules palpated. Salivary glands normal to palpation without swelling, erythema or mass.  Lymphatic: No lymphadenopathy in the neck.  Respiratory: No stridor or distress.  Extremities: No edema or cyanosis. Warm and well-perfused.  Neurologic: CN II-XII intact. Alert and oriented to self, place and time.  Normal reflexes and motor skills, balance and coordination. Moving all extremities without gross abnormality.  Psychiatric:  No unusual anxiety or evidence of depression. Appropriate affect.    Independent Review of Additional Tests or Records:   Medical Records:  1 unique test ordered audiogram and tympanometry 2.   1 unique test reviewed audiogram and tympanometry 11/27/2024 3.   Compared to prior audiogram and tympanometry 05/04/2023 also analyzed and reviewed  Audiogram and tympanometry 11/27/2024  Tympanometry: Tympanometry was completed due to abnormal otoscopy and reported history. Results were: Right Ear: Type As - Normal middle ear pressure and ear canal volume with reduced compliance or mobility of the eardrum. Left Ear: Type As - Normal middle ear pressure and ear canal volume with reduced compliance or mobility of the eardrum.   Audiometric Results: Results were obtained using headphones then inserts. Test reliability  was good.  Test was completed in [employer] [place] 1 and printed out using [employer]. The [employer] [place] was last calibrated on August 21, 2024.   Puretone Thresholds: Right ear results show normal hearing for 250-500 Hz, with a mild loss for 1000-8000 Hz.  Left ear results show normal hearing for 250-500 Hz, with a mild loss for 1000-8000 Hz.    Results are symmetric.   Speech Reception Thresholds: Completed using monitored live voice. Right Ear: 30 dBHL; which is in agreement with PTA.  Left Ear: 30 dBHL; which is in agreement with PTA.    Word Recognition Testing: Completed using a recording of CID-W22. Right Ear: 80 % at 70 dBHL with 50 dBem, which is a compensated listening level. Left Ear: 88 % at 70 dBHL with 50 dBem, which is a compensated listening level.   Impression: Stable thresholds when compared to previous comprehensive test on 05/04/2023. However, word recognition has declined bilaterally.   Recommendations: Recommend repeat hearing test in conjunction with otologic care, sooner with concerns of sudden hearing loss, deteriorating hearing, or new or worsening tinnitus. Recommend proper use of adequate hearing protection in hazardous noise. Recommend continued daily use of amplification.  Audiogram and tympanometry 11/27/2024     Audiogram and tympanometry 05/04/2023       Procedures:   None  Impression & Plans:  [name] is a 73 y.o. female with   1. Sensorineural hearing loss, bilateral      2. Dizziness      3. Hearing aid fitting or adjustment       Assessment & Plan  Sensorineural hearing loss bilaterally with dizziness Patient reported some vestibular disturbance about 3 weeks ago to 1 month ago that has mostly resolved she says that this may have happened remotely many years prior at this time she says her symptoms are subsided she denies having any actual spinning sensation.  Discussed with patient possible workup for vestibular evaluation to include  a Dix-Hallpike procedure in-house or physical therapy referral at this time suggest that since patient's symptoms are at bay and that he has not had any recent within the past 5 years other episodes of this to first monitor but if should symptoms recur that either Dix-Hallpike procedure without any allergy or physical therapy referral would be recommended.  Patient has baseline mild sensorineural hearing loss throughout most frequencies 1000 Hz to 8000 Hz bilaterally and wears a hearing  aid she has had a prior audiogram in June 2024 and today's audiogram 11/27/2024 is comparable patient reassured that with her dizziness symptoms that essentially there is no significant change in her hearing.  Will continue to monitor her symptoms. She should also use hearing protection such as earplugs or event muffs to preserve her hearing.  2.  Hearing aid fitting adjustment Patient notes that there has been some discomfort with wear of her hearing aid and she feels that an adjustment may be needed. She is advised to schedule appointment with her managing audiologist [name], AuD for follow-up visit for possible fitting adjustment.  Medical Decision Making   Patient is here for a new patient visit for evaluation and treatment of:   A. 3 acute uncomplicated illnesses/conditions: level 3: 99203    B. Review and analysis of results:    1 unique test ordered audiogram and tympanometry 2.   1 unique test reviewed audiogram and tympanometry 11/27/2024 3.   Compared to prior audiogram and tympanometry 05/04/2023 also analyzed and reviewed  This qualifies as category 1: Testing documentation any combination of 2 the following. Level 3: 99203   C: Risk of Complications: Over-the-counter drugs or treatments or products were recommended including: Potential referral to audiology or physical therapy for management of dizziness should symptoms recur she should also use hearing protection such as earplugs or event muffs  to preserve her hearing and follow-up with audiology for hearing aid adjustment, which poses a low risk of morbidity or mortality, level 3: 99203   Overall level of service: [phone]   On the day of the visit I spent 35 minutes preparing to see the patient, performing a medically appropriate examination and evaluation, counseling and educating the patient/caregiver, and documenting clinical information in the electronic medical record.This time does not include any time spent performing procedures or assesments that are separately billable.      Electronically signed by: Briana Vina Simpler, NP 11/27/2024 8:23 PM          [1] No past medical history on file. [2] Past Surgical History: Procedure Laterality Date   CATARACT EXTRACTION, BILATERAL  2019  [3]  Current Outpatient Medications:    lisinopriL -hydroCHLOROthiazide  (PRINZIDE ) 20-12.5 mg per tablet, Take 1 tablet by mouth daily., Disp: , Rfl:    pantoprazole  (PROTONIX ) 40 mg EC tablet, Take 40 mg by mouth every morning., Disp: , Rfl:    simvastatin  (ZOCOR ) 40 mg tablet, Take 1 tablet by mouth daily., Disp: , Rfl:

## 2024-12-03 ENCOUNTER — Encounter: Payer: Self-pay | Admitting: Nurse Practitioner

## 2024-12-03 ENCOUNTER — Ambulatory Visit: Admitting: Nurse Practitioner

## 2024-12-03 VITALS — BP 120/70 | HR 83 | Temp 98.1°F | Ht 65.0 in | Wt 202.0 lb

## 2024-12-03 DIAGNOSIS — Z6833 Body mass index (BMI) 33.0-33.9, adult: Secondary | ICD-10-CM

## 2024-12-03 DIAGNOSIS — N1831 Chronic kidney disease, stage 3a: Secondary | ICD-10-CM

## 2024-12-03 DIAGNOSIS — E6609 Other obesity due to excess calories: Secondary | ICD-10-CM

## 2024-12-03 DIAGNOSIS — I129 Hypertensive chronic kidney disease with stage 1 through stage 4 chronic kidney disease, or unspecified chronic kidney disease: Secondary | ICD-10-CM | POA: Diagnosis not present

## 2024-12-03 DIAGNOSIS — E66811 Obesity, class 1: Secondary | ICD-10-CM | POA: Diagnosis not present

## 2024-12-03 DIAGNOSIS — R42 Dizziness and giddiness: Secondary | ICD-10-CM | POA: Diagnosis not present

## 2024-12-03 NOTE — Progress Notes (Signed)
 I,[name], CMA,acting as a neurosurgeon for [employer], FNP.,have documented all relevant documentation on the behalf of [name], FNP,as directed by  [name], FNP while in the presence of [name], FNP.  Subjective:  Patient ID: [name] , female    DOB: 29-Apr-1951 , 74 y.o.   MRN: [id number]  Chief Complaint  Patient presents with   Hypertension    Patient presents today for a bpc. Patient reports she has been checking her bp and it has been running high consistently.    Dizziness    Patient reports she was given meclizine  for her dizziness and she had gotten better but then this past Sunday is when her dizziness came back. Patient reports she did take a pill yesterday but not today because she had to drive.    Discussed the use of AI scribe software for clinical note transcription with the patient, who gave verbal consent to proceed.  History of Present Illness [name] is a 74 year old female who presents with recurrent episodes of vertigo.  She experiences vertigo primarily when standing or making sudden movements, such as getting out of her car or reaching for items on high shelves. The vertigo is alleviated by sitting still and avoiding sudden movements. No associated nausea.  She resumed taking medication for vertigo after the symptoms returned. She also takes a blood pressure medication, which she took before going to church. She monitors her blood pressure at home and noted it was high, although she questions the accuracy of her technique.  She drinks five to six 16-ounce bottles of water daily, which she feels confines her to her home due to frequent urination.  She visited an ear, nose, and throat specialist last week, who confirmed she did not have an ear infection. A hearing test showed a slight hearing loss, but her hearing level remains unchanged. She has not been wearing her hearing aids due to discomfort and poor fit.  She reports this is the  third time she has experienced vertigo symptoms. She has not undergone any recent lab tests or imaging studies related to her vertigo.   Sunday after getting out of the car from church. She took the medication when she got home. She is drinking about 5-6 bottles of water a day.      Hypertension This is a chronic problem. The current episode started more than 1 year ago.  Dizziness This is a recurrent problem. The current episode started more than 1 month ago. The problem has been gradually worsening. Pertinent negatives include no congestion, fatigue or numbness. She has tried position changes for the symptoms.     Past Medical History:  Diagnosis Date   Arthritis    Constipation    GERD (gastroesophageal reflux disease)    H/O breast biopsy    Hypertension    Urticaria      Family History  Problem Relation Age of Onset   Diabetes Mother    Heart attack Father    Cancer Brother    Anesthesia problems Neg Hx     Current Medications[1]   Allergies[2]   Review of Systems  Constitutional:  Negative for fatigue.  HENT:  Negative for congestion.   Neurological:  Positive for dizziness. Negative for numbness.     Today's Vitals   12/03/24 1616  BP: 120/70  Pulse: 83  Temp: 98.1 F (36.7 C)  TempSrc: Oral  Weight: 202 lb (91.6 kg)  Height: 5' 5 (1.651  m)  PainSc: 0-No pain   Body mass index is 33.61 kg/m.  Wt Readings from Last 3 Encounters:  12/03/24 202 lb (91.6 kg)  11/06/24 200 lb (90.7 kg)  09/03/24 200 lb 12.8 oz (91.1 kg)      Objective:  Physical Exam Vitals and nursing note reviewed.  Constitutional:      General: She is not in acute distress.    Appearance: Normal appearance. She is obese.  Cardiovascular:     Rate and Rhythm: Normal rate and regular rhythm.     Pulses: Normal pulses.     Heart sounds: Normal heart sounds. No murmur heard. Pulmonary:     Effort: Pulmonary effort is normal. No respiratory distress.     Breath sounds: Normal  breath sounds. No wheezing.  Skin:    General: Skin is warm and dry.     Capillary Refill: Capillary refill takes less than 2 seconds.  Neurological:     General: No focal deficit present.     Mental Status: She is alert and oriented to person, place, and time.     Cranial Nerves: No cranial nerve deficit, dysarthria or facial asymmetry.     Motor: Motor function is intact. No weakness.     Coordination: Coordination is intact. Romberg sign negative. Coordination normal.     Gait: Gait is intact.  Psychiatric:        Mood and Affect: Mood normal.        Behavior: Behavior normal.        Thought Content: Thought content normal.        Judgment: Judgment normal.       Assessment And Plan:   Assessment & Plan Vertigo Intermittent vertigo exacerbated by standing and sudden movements. Differential includes inner ear issues or neurological causes. - Ordered MRI of the brain to rule out neurological causes. - Continue meclizine  for symptom management. - Maintain adequate hydration. - Contact via MyChart or visit ER if symptoms worsen. - Ordered labs: thyroid  function, CBC, kidney and liver function tests. Benign hypertension with stage 3a chronic kidney disease (HCC) Blood pressure management ongoing with lisinopril -hydrochlorothiazide . Recent home readings higher than usual, possibly due to improper measurement technique. - Continue current antihypertensive regimen. Class 1 obesity due to excess calories with body mass index (BMI) of 33.0 to 33.9 in adult, unspecified whether serious comorbidity present She is encouraged to strive for BMI less than 30 to decrease cardiac risk. Advised to aim for at least 150 minutes of exercise per week.   Orders Placed This Encounter  Procedures   MR Brain W Wo Contrast   TSH   CMP14+EGFR   CBC     Return if symptoms worsen or fail to improve.  Patient was given opportunity to ask questions. Patient verbalized understanding of the plan and  was able to repeat key elements of the plan. All questions were answered to their satisfaction.   LILLETTE [name], FNP, have reviewed all documentation for this visit. The documentation on 12/03/24 for the exam, diagnosis, procedures, and orders are all accurate and complete.    IF YOU HAVE BEEN REFERRED TO A SPECIALIST, IT MAY TAKE 1-2 WEEKS TO SCHEDULE/PROCESS THE REFERRAL. IF YOU HAVE NOT HEARD FROM US /SPECIALIST IN TWO WEEKS, PLEASE GIVE US  A CALL AT [phone].      [1]  Current Outpatient Medications:    aspirin 81 MG tablet, Take 81 mg by mouth daily., Disp: , Rfl:    co-enzyme Q-10 30  MG capsule, Take 30 mg by mouth 3 (three) times daily., Disp: , Rfl:    famotidine (PEPCID) 40 MG tablet, Take 40 mg by mouth 2 (two) times daily., Disp: , Rfl:    lisinopril -hydrochlorothiazide  (ZESTORETIC ) 20-12.5 MG tablet, TAKE 1 TABLET EVERY DAY, Disp: 90 tablet, Rfl: 3   loratadine  (CLARITIN ) 10 MG tablet, TAKE 1 TABLET BY MOUTH EVERY DAY, Disp: 90 tablet, Rfl: 2   MAGNESIUM PO, Magnesium, Disp: , Rfl:    meclizine  (ANTIVERT ) 12.5 MG tablet, Take 1 tablet (12.5 mg total) by mouth 2 (two) times daily as needed for dizziness. DO NOT DRIVE AS MAY CAUSE DROWSINESS, Disp: 30 tablet, Rfl: 0   Multiple Vitamin (MULTIVITAMIN ADULT PO), MULTIVITAMIN, Disp: , Rfl:    pantoprazole  (PROTONIX ) 40 MG tablet, Take 40 mg by mouth every morning., Disp: , Rfl:    simvastatin  (ZOCOR ) 40 MG tablet, TAKE 1 TABLET EVERY DAY, Disp: 90 tablet, Rfl: 3  Current Facility-Administered Medications:    triamcinolone  acetonide (KENALOG -40) injection 40 mg, 40 mg, Intramuscular, Once,  [2]  Allergies Allergen Reactions   Penicillins Hives and Other (See Comments)

## 2024-12-03 NOTE — Patient Instructions (Signed)
 Hypertension, Adult Hypertension is another name for high blood pressure. High blood pressure forces your heart to work harder to pump blood. This can cause problems over time. There are two numbers in a blood pressure reading. There is a top number (systolic) over a bottom number (diastolic). It is best to have a blood pressure that is below 120/80. What are the causes? The cause of this condition is not known. Some other conditions can lead to high blood pressure. What increases the risk? Some lifestyle factors can make you more likely to develop high blood pressure: Smoking. Not getting enough exercise or physical activity. Being overweight. Having too much fat, sugar, calories, or salt (sodium) in your diet. Drinking too much alcohol. Other risk factors include: Having any of these conditions: Heart disease. Diabetes. High cholesterol. Kidney disease. Obstructive sleep apnea. Having a family history of high blood pressure and high cholesterol. Age. The risk increases with age. Stress. What are the signs or symptoms? High blood pressure may not cause symptoms. Very high blood pressure (hypertensive crisis) may cause: Headache. Fast or uneven heartbeats (palpitations). Shortness of breath. Nosebleed. Vomiting or feeling like you may vomit (nauseous). Changes in how you see. Very bad chest pain. Feeling dizzy. Seizures. How is this treated? This condition is treated by making healthy lifestyle changes, such as: Eating healthy foods. Exercising more. Drinking less alcohol. Your doctor may prescribe medicine if lifestyle changes do not help enough and if: Your top number is above 130. Your bottom number is above 80. Your personal target blood pressure may vary. Follow these instructions at home: Eating and drinking  If told, follow the DASH eating plan. To follow this plan: Fill one half of your plate at each meal with fruits and vegetables. Fill one fourth of your plate  at each meal with whole grains. Whole grains include whole-wheat pasta, brown rice, and whole-grain bread. Eat or drink low-fat dairy products, such as skim milk or low-fat yogurt. Fill one fourth of your plate at each meal with low-fat (lean) proteins. Low-fat proteins include fish, chicken without skin, eggs, beans, and tofu. Avoid fatty meat, cured and processed meat, or chicken with skin. Avoid pre-made or processed food. Limit the amount of salt in your diet to less than 1,500 mg each day. Do not drink alcohol if: Your doctor tells you not to drink. You are pregnant, may be pregnant, or are planning to become pregnant. If you drink alcohol: Limit how much you have to: 0-1 drink a day for women. 0-2 drinks a day for men. Know how much alcohol is in your drink. In the [place] one drink equals one 12 oz bottle of beer (355 mL), one 5 oz glass of wine (148 mL), or one 1 oz glass of hard liquor (44 mL). Lifestyle  Work with your doctor to stay at a healthy weight or to lose weight. Ask your doctor what the best weight is for you. Get at least 30 minutes of exercise that causes your heart to beat faster (aerobic exercise) most days of the week. This may include walking, swimming, or biking. Get at least 30 minutes of exercise that strengthens your muscles (resistance exercise) at least 3 days a week. This may include lifting weights or doing Pilates. Do not smoke or use any products that contain nicotine or tobacco. If you need help quitting, ask your doctor. Check your blood pressure at home as told by your doctor. Keep all follow-up visits. Medicines Take over-the-counter and prescription medicines  only as told by your doctor. Follow directions carefully. Do not skip doses of blood pressure medicine. The medicine does not work as well if you skip doses. Skipping doses also puts you at risk for problems. Ask your doctor about side effects or reactions to medicines that you should watch  for. Contact a doctor if: You think you are having a reaction to the medicine you are taking. You have headaches that keep coming back. You feel dizzy. You have swelling in your ankles. You have trouble with your vision. Get help right away if: You get a very bad headache. You start to feel mixed up (confused). You feel weak or numb. You feel faint. You have very bad pain in your: Chest. Belly (abdomen). You vomit more than once. You have trouble breathing. These symptoms may be an emergency. Get help right away. Call 911. Do not wait to see if the symptoms will go away. Do not drive yourself to the hospital. Summary Hypertension is another name for high blood pressure. High blood pressure forces your heart to work harder to pump blood. For most people, a normal blood pressure is less than 120/80. Making healthy choices can help lower blood pressure. If your blood pressure does not get lower with healthy choices, you may need to take medicine. This information is not intended to replace advice given to you by your health care provider. Make sure you discuss any questions you have with your health care provider. Document Revised: 09/02/2021 Document Reviewed: 09/02/2021 Elsevier Patient Education  2024 [employer].

## 2024-12-04 LAB — CMP14+EGFR
ALT: 18 IU/L (ref 0–32)
AST: 28 IU/L (ref 0–40)
Albumin: 4.3 g/dL (ref 3.8–4.8)
Alkaline Phosphatase: 101 IU/L (ref 49–135)
BUN/Creatinine Ratio: 20 (ref 12–28)
BUN: 23 mg/dL (ref 8–27)
Bilirubin Total: <0.2 mg/dL (ref 0.0–1.2)
CO2: 24 mmol/L (ref 20–29)
Calcium: 10 mg/dL (ref 8.7–10.3)
Chloride: 99 mmol/L (ref 96–106)
Creatinine, Ser: 1.13 mg/dL — ABNORMAL HIGH (ref 0.57–1.00)
Globulin, Total: 2.9 g/dL (ref 1.5–4.5)
Glucose: 87 mg/dL (ref 70–99)
Potassium: 4.4 mmol/L (ref 3.5–5.2)
Sodium: 138 mmol/L (ref 134–144)
Total Protein: 7.2 g/dL (ref 6.0–8.5)
eGFR: 51 mL/min/1.73 — ABNORMAL LOW

## 2024-12-04 LAB — CBC
Hematocrit: 37.6 % (ref 34.0–46.6)
Hemoglobin: 12.6 g/dL (ref 11.1–15.9)
MCH: 30.7 pg (ref 26.6–33.0)
MCHC: 33.5 g/dL (ref 31.5–35.7)
MCV: 92 fL (ref 79–97)
Platelets: 385 x10E3/uL (ref 150–450)
RBC: 4.1 x10E6/uL (ref 3.77–5.28)
RDW: 12.6 % (ref 11.7–15.4)
WBC: 7.2 x10E3/uL (ref 3.4–10.8)

## 2024-12-04 LAB — TSH: TSH: 0.929 u[IU]/mL (ref 0.450–4.500)

## 2024-12-05 ENCOUNTER — Encounter: Payer: Self-pay | Admitting: Nurse Practitioner

## 2024-12-06 ENCOUNTER — Other Ambulatory Visit

## 2024-12-12 ENCOUNTER — Ambulatory Visit: Admitting: Dermatology

## 2024-12-15 ENCOUNTER — Ambulatory Visit: Payer: Self-pay | Admitting: Nurse Practitioner

## 2024-12-15 NOTE — Assessment & Plan Note (Signed)
 Blood pressure management ongoing with lisinopril -hydrochlorothiazide . Recent home readings higher than usual, possibly due to improper measurement technique. - Continue current antihypertensive regimen.

## 2024-12-15 NOTE — Assessment & Plan Note (Signed)
 Intermittent vertigo exacerbated by standing and sudden movements. Differential includes inner ear issues or neurological causes. - Ordered MRI of the brain to rule out neurological causes. - Continue meclizine  for symptom management. - Maintain adequate hydration. - Contact via MyChart or visit ER if symptoms worsen. - Ordered labs: thyroid  function, CBC, kidney and liver function tests.

## 2024-12-17 ENCOUNTER — Ambulatory Visit
Admission: RE | Admit: 2024-12-17 | Discharge: 2024-12-17 | Disposition: A | Source: Ambulatory Visit | Attending: Nurse Practitioner | Admitting: Nurse Practitioner

## 2024-12-17 DIAGNOSIS — R42 Dizziness and giddiness: Secondary | ICD-10-CM

## 2024-12-17 MED ORDER — GADOPICLENOL 0.5 MMOL/ML IV SOLN
10.0000 mL | Freq: Once | INTRAVENOUS | Status: AC | PRN
  Administered 2024-12-17: 10 mL via INTRAVENOUS

## 2025-01-02 ENCOUNTER — Other Ambulatory Visit

## 2025-03-06 ENCOUNTER — Encounter: Payer: Self-pay | Admitting: Nurse Practitioner

## 2025-04-23 ENCOUNTER — Ambulatory Visit: Payer: Self-pay | Admitting: Nurse Practitioner
